# Patient Record
Sex: Female | Born: 1995 | Race: Black or African American | Hispanic: No | Marital: Single | Smoking: Never smoker
Health system: Southern US, Community
[De-identification: ages and names within clinical notes are randomized; demographics above are authoritative.]

## PROBLEM LIST (undated history)

## (undated) ENCOUNTER — Inpatient Hospital Stay (HOSPITAL_COMMUNITY): Payer: Self-pay

## (undated) DIAGNOSIS — O34219 Maternal care for unspecified type scar from previous cesarean delivery: Secondary | ICD-10-CM

## (undated) DIAGNOSIS — B009 Herpesviral infection, unspecified: Secondary | ICD-10-CM

## (undated) DIAGNOSIS — F419 Anxiety disorder, unspecified: Secondary | ICD-10-CM

## (undated) HISTORY — DX: Maternal care for unspecified type scar from previous cesarean delivery: O34.219

## (undated) NOTE — *Deleted (*Deleted)
   CC: ***  HPI:  [name] is a 75 y.o. female with history as below presenting for ***. Please refer to problem based charting for further details of assessment and plan of current problem and chronic medical conditions.  Hand pain Was last evaluated for this on 04/30/2020.  She has chronic bilateral pain of MCP and PIP joints with notable subjective swelling in the hands but no clear synovitis.  Also has intermittent abdominal/back pain and inflammation of terminal ileum prior to CT scan.  She does describe what sounds like erythema nodosum at times, but not present on prior examination.  Had colonoscopy with GI but no evidence of IBD at that time.  Previous lab work significant for negative ANA, CCP, RF, inflammatory markers, HIV, hep C, fecal calprotectin.  Did have an ESR of 25 at one point.  Chest x-ray was also ordered but patient never went to radiology department.  Last provider noticed skin thickening on her palms and raise question of scleroderma.  She requested a work note that she will be unable to use her hands, but was noted to be texting without difficulty during last appointment.  Was started on 5 days of steroids.  Healthcare maintenance -Tdap -Flu vaccine -Covid vaccine -Chlamydia screen  Past Medical History:  Diagnosis Date  . Anxiety 2016  . Herpes simplex virus (HSV) infection   . VBAC, delivered    Review of Systems:   ROS ***  Physical Exam: There were no vitals filed for this visit. Constitutional: no acute distress Head: atraumatic ENT: external ears normal Cardiovascular: regular rate and rhythm, normal heart sounds Pulmonary: effort normal, normal breath sounds bilaterally Abdominal: flat, nontender, no rebound tenderness, bowel sounds normal Musculoskeletal: *** Skin: warm and dry Neurological: alert, no focal deficit Psychiatric: normal mood and affect  Assessment & Plan:   See Encounters Tab for problem based charting.  Patient  {GC/GE:[id number]::"discussed with","seen with"} Dr. {NAMES:[id number]::"Butcher","Guilloud","Hoffman","Mullen","Narendra","Raines","Vincent"}

---
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
## 2016-09-30 ENCOUNTER — Encounter (HOSPITAL_COMMUNITY): Payer: Self-pay

## 2017-02-01 ENCOUNTER — Ambulatory Visit (HOSPITAL_COMMUNITY)
Admission: EM | Admit: 2017-02-01 | Discharge: 2017-02-01 | Disposition: A | Discharge status: Home / Self Care | Payer: Medicaid Other | Attending: Family Medicine | Admitting: Family Medicine

## 2017-02-01 ENCOUNTER — Encounter (HOSPITAL_COMMUNITY): Payer: Self-pay

## 2017-02-01 DIAGNOSIS — I808 Phlebitis and thrombophlebitis of other sites: Secondary | ICD-10-CM | POA: Diagnosis not present

## 2017-02-01 DIAGNOSIS — D649 Anemia, unspecified: Secondary | ICD-10-CM

## 2017-02-01 LAB — POCT I-STAT, CHEM 8
BUN: 11 mg/dL (ref 6–20)
CREATININE: 0.7 mg/dL (ref 0.44–1.00)
Calcium, Ion: 1.3 mmol/L (ref 1.15–1.40)
Chloride: 105 mmol/L (ref 101–111)
Glucose, Bld: 86 mg/dL (ref 65–99)
HEMATOCRIT: 31 % — AB (ref 36.0–46.0)
HEMOGLOBIN: 10.5 g/dL — AB (ref 12.0–15.0)
POTASSIUM: 3.8 mmol/L (ref 3.5–5.1)
SODIUM: 141 mmol/L (ref 135–145)
TCO2: 27 mmol/L (ref 0–100)

## 2017-02-01 MED ORDER — IBUPROFEN 600 MG PO TABS: 600.0000 mg | ORAL_TABLET | Freq: Three times a day (TID) | ORAL | 0 refills | Status: DC

## 2017-02-01 MED ORDER — FERROUS SULFATE 325 (65 FE) MG PO TABS: 325.0000 mg | ORAL_TABLET | Freq: Two times a day (BID) | ORAL | 3 refills | Status: DC

## 2017-02-01 NOTE — ED Provider Notes (Signed)
[facility]    CSN: [id number] Arrival date & time: 02/01/17  1925     History   Chief Complaint Chief Complaint  Patient presents with  . Arm Pain    HPI Phyllis Kidd is a 21 y.o. female.   HPI  21 year old female with history of mild anemia presents with complaint of 3 days of right upper arm pain with redness and swelling. She donated plasma 3 days ago he developed pain at the IV insertion site during the donation. She was unable to have her red cells return to her.  She reports symptoms have not improved over the last 3 days. She denies treatment. She denies fever or chills.  She does endorse lightheadedness, mild shortness of breath over the last year, intermittent sharp left flank and lower abdominal pains over the last week. She denies dysuria, nausea, vomiting, diarrhea and constipation.   Past Medical History:  Diagnosis Date  . Anxiety 2016    Patient Active Problem List   Diagnosis Date Noted  . Late prenatal care affecting pregnancy in third trimester, antepartum 11/26/2015    History reviewed. No pertinent surgical history.  OB History    Gravida Para Term Preterm AB Living   1 0 0 0 0     SAB TAB Ectopic Multiple Live Births   0 0 0           Home Medications    Prior to Admission medications   Medication Sig Start Date End Date Taking? Authorizing Provider  cephALEXin (KEFLEX) 500 MG capsule Take 1 capsule (500 mg total) by mouth 4 (four) times daily. 11/29/15   Marny Lowenstein, PA-C  Prenatal Vit-Fe Fumarate-FA (PRENATAL VITAMINS PLUS) 27-1 MG TABS Take 1 tablet by mouth once. 11/11/15   Willodean Rosenthal, MD  Prenatal Vit-Fe Fumarate-FA (PRENATAL VITAMINS PLUS) 27-1 MG TABS Take 1 tablet by mouth once. 11/11/15   Willodean Rosenthal, MD    Family History History reviewed. No pertinent family history.  Social History Social History  Substance Use Topics  . Smoking status: Never Smoker  . Smokeless tobacco: Never Used    . Alcohol use No     Allergies   Patient has no known allergies.   Review of Systems Review of Systems  Constitutional: Negative for chills and fever.  Eyes: Negative for visual disturbance.  Respiratory: Negative for shortness of breath.   Cardiovascular: Negative for chest pain.  Gastrointestinal: Positive for abdominal pain. Negative for blood in stool.  Musculoskeletal: Positive for myalgias. Negative for arthralgias and back pain.  Skin: Positive for color change. Negative for rash.  Allergic/Immunologic: Negative for immunocompromised state.  Neurological: Positive for light-headedness.  Hematological: Negative for adenopathy. Does not bruise/bleed easily.  Psychiatric/Behavioral: Negative for dysphoric mood and suicidal ideas.     Physical Exam Triage Vital Signs ED Triage Vitals  Enc Vitals Group     BP 02/01/17 1948 113/76     Pulse Rate 02/01/17 1948 95     Resp 02/01/17 1948 17     Temp 02/01/17 1948 98.8 F (37.1 C)     Temp Source 02/01/17 1948 Oral     SpO2 02/01/17 1948 100 %     Weight --      Height --      Head Circumference --      Peak Flow --      Pain Score 02/01/17 1950 9     Pain Loc --      Pain Edu? --  Excl. in GC? --    No data found.   Updated Vital Signs BP 113/76 (BP Location: Left Arm)   Pulse 95   Temp 98.8 F (37.1 C) (Oral)   Resp 17   LMP 01/25/2017 (Exact Date)   SpO2 100%   Visual Acuity Right Eye Distance:   Left Eye Distance:   Bilateral Distance:    Right Eye Near:   Left Eye Near:    Bilateral Near:     Physical Exam  Constitutional: She is oriented to person, place, and time. She appears well-developed and well-nourished. No distress.  HENT:  Head: Normocephalic and atraumatic.  Eyes:    Cardiovascular: Normal rate, regular rhythm, normal heart sounds and intact distal pulses.   Pulmonary/Chest: Effort normal and breath sounds normal.  Abdominal: Soft. Bowel sounds are normal. She exhibits no  distension and no mass. There is no tenderness. There is no rebound and no guarding. No hernia.  Musculoskeletal: She exhibits no edema.       Arms: Lymphadenopathy:    She has no axillary adenopathy.  Neurological: She is alert and oriented to person, place, and time.  Skin: Skin is warm and dry. No rash noted.  Psychiatric: She has a normal mood and affect.     UC Treatments / Results  Labs (all labs ordered are listed, but only abnormal results are displayed) Labs Reviewed  POCT I-STAT, CHEM 8 - Abnormal; Notable for the following:       Result Value   Hemoglobin 10.5 (*)    HCT 31.0 (*)    All other components within normal limits    EKG  EKG Interpretation None       Radiology No results found.  Procedures Procedures (including critical care time)  Medications Ordered in UC Medications - No data to display   Initial Impression / Assessment and Plan / UC Course  I have reviewed the triage vital signs and the nursing notes.  Pertinent labs & imaging results that were available during my care of the patient were reviewed by me and considered in my medical decision making (see chart for details).       Final Clinical Impressions(s) / UC Diagnoses   Final diagnoses:  Phlebitis of right arm  Mild anemia   Patient with evidence of phlebitis following plasma donation and mild anemia confirmed on i-STAT She's not orthostatic. There is no palpable thrombus on exam. Plan is for conservative management of phlebitis including NSAIDs. Iron order from mild anemia patient advised to establish with primary care provider to follow-up response to treatment. New Prescriptions Discharge Medication List as of 02/01/2017  8:43 PM    START taking these medications   Details  ferrous sulfate 325 (65 FE) MG tablet Take 1 tablet (325 mg total) by mouth 2 (two) times daily with a meal., Starting Wed 02/01/2017, Normal    ibuprofen (ADVIL,MOTRIN) 600 MG tablet Take 1 tablet  (600 mg total) by mouth 3 (three) times daily. With food for next 5-7 days, Starting Wed 02/01/2017, Normal         Dessa PhiFunches, Trestan Vahle, MD 02/01/17 2051

## 2017-02-01 NOTE — ED Triage Notes (Addendum)
[facility] of [facility] pain x3 days, no medication has been taken for pain, pt denies any injuries, pt states she was donating plasma and since then have been experiencing arm pain

## 2017-02-01 NOTE — ED Notes (Signed)
BP   101/69   Pulse  73        Laying         114  /77    Sitting      Pulse    70    bp  115/78  Pulse   71   Standing

## 2017-02-01 NOTE — Discharge Instructions (Signed)
You do have mild anemia which has actually improved from last year. This is low hemoglobin the part of blood that carries oxygen.  Please take iron twice daily with orange juice or vitamin C tablet to help improve anemia.  For your bruising, pain and swelling in your R arm this is called phlebitis from damage to the vein. Ice for 20 minutes twice daily with cold pack wrapped in a thin cloth.  Take ibuprofen with meals for next 5-7 days.

## 2017-02-27 ENCOUNTER — Ambulatory Visit (HOSPITAL_COMMUNITY)
Admission: EM | Admit: 2017-02-27 | Discharge: 2017-02-27 | Discharge status: Against Medical Advice | Payer: Medicaid Other

## 2017-02-27 NOTE — ED Notes (Addendum)
Called x2... No answer... D/c from dept.

## 2017-02-27 NOTE — ED Notes (Signed)
Called name x1... No answer.

## 2017-04-14 ENCOUNTER — Inpatient Hospital Stay (HOSPITAL_COMMUNITY)
Admission: AD | Admit: 2017-04-14 | Discharge: 2017-04-14 | Disposition: A | Discharge status: Home / Self Care | Payer: Medicaid Other | Source: Ambulatory Visit | Attending: Obstetrics and Gynecology | Admitting: Obstetrics and Gynecology

## 2017-04-14 ENCOUNTER — Telehealth: Payer: Self-pay | Admitting: Student

## 2017-04-14 ENCOUNTER — Encounter (HOSPITAL_COMMUNITY): Payer: Self-pay | Admitting: *Deleted

## 2017-04-14 ENCOUNTER — Inpatient Hospital Stay (HOSPITAL_COMMUNITY): Payer: Medicaid Other

## 2017-04-14 DIAGNOSIS — Z3A01 Less than 8 weeks gestation of pregnancy: Secondary | ICD-10-CM | POA: Diagnosis not present

## 2017-04-14 DIAGNOSIS — O23591 Infection of other part of genital tract in pregnancy, first trimester: Secondary | ICD-10-CM | POA: Insufficient documentation

## 2017-04-14 DIAGNOSIS — R109 Unspecified abdominal pain: Secondary | ICD-10-CM | POA: Insufficient documentation

## 2017-04-14 DIAGNOSIS — O26891 Other specified pregnancy related conditions, first trimester: Secondary | ICD-10-CM

## 2017-04-14 DIAGNOSIS — N760 Acute vaginitis: Secondary | ICD-10-CM

## 2017-04-14 DIAGNOSIS — B9689 Other specified bacterial agents as the cause of diseases classified elsewhere: Secondary | ICD-10-CM

## 2017-04-14 DIAGNOSIS — O26899 Other specified pregnancy related conditions, unspecified trimester: Secondary | ICD-10-CM

## 2017-04-14 DIAGNOSIS — R102 Pelvic and perineal pain: Secondary | ICD-10-CM

## 2017-04-14 LAB — COMPREHENSIVE METABOLIC PANEL
ALT: 9 U/L — AB (ref 14–54)
AST: 14 U/L — AB (ref 15–41)
Albumin: 4.1 g/dL (ref 3.5–5.0)
Alkaline Phosphatase: 45 U/L (ref 38–126)
Anion gap: 5 (ref 5–15)
BILIRUBIN TOTAL: 0.3 mg/dL (ref 0.3–1.2)
BUN: 8 mg/dL (ref 6–20)
CHLORIDE: 105 mmol/L (ref 101–111)
CO2: 24 mmol/L (ref 22–32)
CREATININE: 0.56 mg/dL (ref 0.44–1.00)
Calcium: 9.3 mg/dL (ref 8.9–10.3)
Glucose, Bld: 98 mg/dL (ref 65–99)
Potassium: 3.8 mmol/L (ref 3.5–5.1)
Sodium: 134 mmol/L — ABNORMAL LOW (ref 135–145)
TOTAL PROTEIN: 7.7 g/dL (ref 6.5–8.1)

## 2017-04-14 LAB — URINALYSIS, ROUTINE W REFLEX MICROSCOPIC
BILIRUBIN URINE: NEGATIVE
GLUCOSE, UA: NEGATIVE mg/dL
HGB URINE DIPSTICK: NEGATIVE
KETONES UR: NEGATIVE mg/dL
Leukocytes, UA: NEGATIVE
Nitrite: NEGATIVE
PH: 5 (ref 5.0–8.0)
PROTEIN: NEGATIVE mg/dL
Specific Gravity, Urine: 1.025 (ref 1.005–1.030)

## 2017-04-14 LAB — CBC WITH DIFFERENTIAL/PLATELET
Basophils Absolute: 0 10*3/uL (ref 0.0–0.1)
Basophils Relative: 0 %
EOS ABS: 0 10*3/uL (ref 0.0–0.7)
EOS PCT: 0 %
HCT: 32.6 % — ABNORMAL LOW (ref 36.0–46.0)
Hemoglobin: 10.9 g/dL — ABNORMAL LOW (ref 12.0–15.0)
LYMPHS ABS: 2.6 10*3/uL (ref 0.7–4.0)
LYMPHS PCT: 33 %
MCH: 27.4 pg (ref 26.0–34.0)
MCHC: 33.4 g/dL (ref 30.0–36.0)
MCV: 81.9 fL (ref 78.0–100.0)
MONOS PCT: 2 %
Monocytes Absolute: 0.2 10*3/uL (ref 0.1–1.0)
Neutro Abs: 5 10*3/uL (ref 1.7–7.7)
Neutrophils Relative %: 65 %
PLATELETS: 331 10*3/uL (ref 150–400)
RBC: 3.98 MIL/uL (ref 3.87–5.11)
RDW: 14.3 % (ref 11.5–15.5)
WBC: 7.8 10*3/uL (ref 4.0–10.5)

## 2017-04-14 LAB — WET PREP, GENITAL
SPERM: NONE SEEN
TRICH WET PREP: NONE SEEN
YEAST WET PREP: NONE SEEN

## 2017-04-14 LAB — ABO/RH: ABO/RH(D): B POS

## 2017-04-14 LAB — HCG, QUANTITATIVE, PREGNANCY: hCG, Beta Chain, Quant, S: 171430 m[IU]/mL — ABNORMAL HIGH (ref <5)

## 2017-04-14 LAB — POCT PREGNANCY, URINE: Preg Test, Ur: POSITIVE — AB

## 2017-04-14 MED ORDER — METRONIDAZOLE 500 MG PO TABS: 500.0000 mg | ORAL_TABLET | Freq: Three times a day (TID) | ORAL | 0 refills | Status: AC

## 2017-04-14 NOTE — Telephone Encounter (Signed)
Called patient and informed her of her BV diagnosis; instructed patient on antabuse reaction and importance of taking medication with food.

## 2017-04-14 NOTE — MAU Provider Note (Signed)
Chief Complaint:  Abdominal Pain   First Provider Initiated Contact with Patient 04/14/17 1450     HPI: Phyllis Kidd is a 21 y.o. G2P1001 at [redacted]w[redacted]d who presents to maternity admissions reporting abdominal pain. Pt reports that she's been having abdominal pain and headaches for about three months. She notes that the pain is a sharp and constant pain that is really only relieved by laying still on her back. The she is able to ambulate but pain restricts her movement as to not making quick movements. She tried taking ibuprofen for the pain and headaches but they did not help. She notes that the pain has been progressively getting worse and she was encouraged by her family to come to the hospital for evaluation. Pt is unable to pinpoint LMP but say that is was at the beginning of august. When pressed she says her LMP began august 5th ot 6th. Pt reports that her diet often consists of fast food and pizza, which she eats for most meals, the last two days she ate fast foods like [employer] and cookout, hotdogs, pizza for each meal. She reports nausea and she reports SOB occasionally in the mornings when she wakes up. She denies, vomiting, diarrhea, constipation, fever, visual changes, dysuria, pyuria, vaginal discharge, and vaginal bleeding.   Past Medical History: Past Medical History:  Diagnosis Date  . Anxiety 2016    Past obstetric history: OB History  Gravida Para Term Preterm AB Living  0 0 1  SAB TAB Ectopic Multiple Live Births  0 0 0   1    # Outcome Date GA Lbr Len/2nd Weight Sex Delivery Anes PTL Lv  2 Current           1 Term 01/18/16 [redacted]w[redacted]d   M CS-LTranv EPI  LIV      Past Surgical History: No past surgical history on file.   Family History: No family history on file.  Social History: Social History  Substance Use Topics  . Smoking status: Never Smoker  . Smokeless tobacco: Never Used  . Alcohol use No    Allergies: No Known Allergies  Meds:  Prescriptions  Prior to Admission  Medication Sig Dispense Refill Last Dose  . ferrous sulfate 325 (65 FE) MG tablet Take 1 tablet (325 mg total) by mouth 2 (two) times daily with a meal. 60 tablet 3   . ibuprofen (ADVIL,MOTRIN) 600 MG tablet Take 1 tablet (600 mg total) by mouth 3 (three) times daily. With food for next 5-7 days 30 tablet 0     I have reviewed patient's Past Medical Hx, Surgical Hx, Family Hx, Social Hx, medications and allergies.   ROS:  Review of Systems  Constitutional: Negative.   HENT: Negative.   Respiratory: Positive for shortness of breath.   Cardiovascular: Negative.   Gastrointestinal: Positive for abdominal pain and nausea. Negative for blood in stool, constipation, diarrhea and vomiting.  Genitourinary: Positive for pelvic pain. Negative for dysuria, hematuria, vaginal bleeding and vaginal discharge.  Musculoskeletal: Negative.   Neurological: Negative.     Physical Exam  Patient Vitals for the past 24 hrs:  BP Temp Pulse Resp Height Weight  04/14/17 1444 127/71 98.4 F (36.9 C) 96 18  (1.676 m) 64.4 kg (142 lb)   Constitutional: Well-developed, well-nourished female in no acute distress.  Cardiovascular: normal rate Respiratory: normal effort GI: Abd soft, tender in all 4 abdominal quadrants but particular tender in the RLQ and LLQ MS: Extremities nontender, no  edema, normal ROM Neurologic: Alert and oriented x 4.  GU: Neg CVAT. Pelvic: NEFG, small suprapubic mass felt on palpation, thin white watery discharge coming from vagina, no blood, no cervical motion tenderness, no adnexal mass     Labs: Results for orders placed or performed during the hospital encounter of 04/14/17 (from the past 24 hour(s))  Urinalysis, Routine w reflex microscopic     Status: None   Collection Time: 04/14/17  2:35 PM  Result Value Ref Range   Color, Urine YELLOW YELLOW   APPearance CLEAR CLEAR   Specific Gravity, Urine 1.025 1.005 - 1.030   pH 5.0 5.0 - 8.0   Glucose, UA  NEGATIVE NEGATIVE mg/dL   Hgb urine dipstick NEGATIVE NEGATIVE   Bilirubin Urine NEGATIVE NEGATIVE   Ketones, ur NEGATIVE NEGATIVE mg/dL   Protein, ur NEGATIVE NEGATIVE mg/dL   Nitrite NEGATIVE NEGATIVE   Leukocytes, UA NEGATIVE NEGATIVE  Pregnancy, urine POC     Status: Abnormal   Collection Time: 04/14/17  3:14 PM  Result Value Ref Range   Preg Test, Ur POSITIVE (A) NEGATIVE  CBC with Differential/Platelet     Status: Abnormal   Collection Time: 04/14/17  4:05 PM  Result Value Ref Range   WBC 7.8 4.0 - 10.5 K/uL   RBC 3.98 3.87 - 5.11 MIL/uL   Hemoglobin 10.9 (L) 12.0 - 15.0 g/dL   HCT 16.132.6 (L) 09.636.0 - 04.546.0 %   MCV 81.9 78.0 - 100.0 fL   MCH 27.4 26.0 - 34.0 pg   MCHC 33.4 30.0 - 36.0 g/dL   RDW 40.914.3 81.111.5 - 91.415.5 %   Platelets 331 150 - 400 K/uL   Neutrophils Relative % 65 %   Neutro Abs 5.0 1.7 - 7.7 K/uL   Lymphocytes Relative 33 %   Lymphs Abs 2.6 0.7 - 4.0 K/uL   Monocytes Relative 2 %   Monocytes Absolute 0.2 0.1 - 1.0 K/uL   Eosinophils Relative 0 %   Eosinophils Absolute 0.0 0.0 - 0.7 K/uL   Basophils Relative 0 %   Basophils Absolute 0.0 0.0 - 0.1 K/uL  ABO/Rh     Status: None   Collection Time: 04/14/17  4:05 PM  Result Value Ref Range   ABO/RH(D) B POS   Comprehensive metabolic panel     Status: Abnormal   Collection Time: 04/14/17  4:05 PM  Result Value Ref Range   Sodium 134 (L) 135 - 145 mmol/L   Potassium 3.8 3.5 - 5.1 mmol/L   Chloride 105 101 - 111 mmol/L   CO2 24 22 - 32 mmol/L   Glucose, Bld 98 65 - 99 mg/dL   BUN 8 6 - 20 mg/dL   Creatinine, Ser 7.820.56 0.44 - 1.00 mg/dL   Calcium 9.3 8.9 - 95.610.3 mg/dL   Total Protein 7.7 6.5 - 8.1 g/dL   Albumin 4.1 3.5 - 5.0 g/dL   AST 14 (L) 15 - 41 U/L   ALT 9 (L) 14 - 54 U/L   Alkaline Phosphatase 45 38 - 126 U/L   Total Bilirubin 0.3 0.3 - 1.2 mg/dL   GFR calc non Af Amer >60 >60 mL/min   GFR calc Af Amer >60 >60 mL/min   Anion gap 5 5 - 15    Imaging:  [place] Ob Comp Less 14 Wks  Result Date:  04/14/2017 CLINICAL DATA:  Pelvic pain affecting first-trimester pregnancy. Gestational age by LMP of 4 weeks 3 days. EXAM: OBSTETRIC <14 WK US AND TRANSVAGINAL OB [place]  TECHNIQUE: Both transabdominal and transvaginal ultrasound examinations were performed for complete evaluation of the gestation as well as the maternal uterus, adnexal regions, and pelvic cul-de-sac. Transvaginal technique was performed to assess early pregnancy. COMPARISON:  None. FINDINGS: Intrauterine gestational sac: Single Yolk sac:  Visualized. Embryo:  Visualized. Cardiac Activity: Visualized. Heart Rate: 166  bpm CRL:  16  mm   8 w   0 d                  [place] EDC: 11/24/2017 Subchorionic hemorrhage:  None visualized. Maternal uterus/adnexae: Small left ovarian corpus luteum. Normal appearance of right ovary. No mass or abnormal free fluid identified. IMPRESSION: Single living IUP measuring 8 weeks 0 days, with [place] EDC of 11/24/2017. No significant maternal uterine or adnexal abnormality identified. Electronically Signed   By: Myles Rosenthal M.D.   On: 04/14/2017 16:53   US Ob Transvaginal  Result Date: 04/14/2017 CLINICAL DATA:  Pelvic pain affecting first-trimester pregnancy. Gestational age by LMP of 4 weeks 3 days. EXAM: OBSTETRIC <14 WK [place] AND TRANSVAGINAL OB US TECHNIQUE: Both transabdominal and transvaginal ultrasound examinations were performed for complete evaluation of the gestation as well as the maternal uterus, adnexal regions, and pelvic cul-de-sac. Transvaginal technique was performed to assess early pregnancy. COMPARISON:  None. FINDINGS: Intrauterine gestational sac: Single Yolk sac:  Visualized. Embryo:  Visualized. Cardiac Activity: Visualized. Heart Rate: 166  bpm CRL:  16  mm   8 w   0 d                  [place] EDC: 11/24/2017 Subchorionic hemorrhage:  None visualized. Maternal uterus/adnexae: Small left ovarian corpus luteum. Normal appearance of right ovary. No mass or abnormal free fluid identified. IMPRESSION: Single living IUP  measuring 8 weeks 0 days, with [place] EDC of 11/24/2017. No significant maternal uterine or adnexal abnormality identified. Electronically Signed   By: Myles Rosenthal M.D.   On: 04/14/2017 16:53    MAU Course: -Pregnancy test - positive -Doppler - HR unable to be doppled -U/S - see above -CBC -ABO/Rh -Wet prep-positive for clue -GC/Chlamydia-pending  Pt has an 8wk IUP. bHCG results returned positive for pregnancy, after a work up for ectopic location involving an abdominal and a vaginal U/S, ectopic pregnancy can be ruled out. Will need to schedule an appointment at [facility] to initiate prenatal care. Abdominal pain could be a result of pt's dietary habits. Patient was advised on diet and that a change in what she eats could help the abdominal. If pain is refractory to dietary management, will need to follow up with GI.  MDM:  Assessment/Plan:  1. Abdominal pain affecting pregnancy, antepartum   2. Pelvic pain affecting pregnancy   3. Bacterial vaginosis       Discharge home in stable condition.     Julieanne Cotton T, Medical Student 04/14/2017 3:50 PM  I confirm that I have verified the information documented in the student's note and that I have also personally reperformed the physical exam and all medical decision making activities.    2. Patient left before she received results of her wet prep; she knows that we will call her if she needs medication.  3. Recommended that patient try diet and lifestyle changes first for abdominal pain, and that she seek out a PCP if these measures fail.  4. Patient does not want numbers for ob-gyn clinics at this time; she is considering her options.  5. Return precautions given; all questions answered.  Luna Kitchens CNM

## 2017-04-14 NOTE — Discharge Instructions (Signed)
First Trimester of Pregnancy The first trimester of pregnancy is from week 1 until the end of week 13 (months 1 through 3). During this time, your baby will begin to develop inside you. At 6-8 weeks, the eyes and face are formed, and the heartbeat can be seen on ultrasound. At the end of 12 weeks, all the baby's organs are formed. Prenatal care is all the medical care you receive before the birth of your baby. Make sure you get good prenatal care and follow all of your doctor's instructions. Follow these instructions at home: Medicines  Take over-the-counter and prescription medicines only as told by your doctor. Some medicines are safe and some medicines are not safe during pregnancy.  Take a prenatal vitamin that contains at least 600 micrograms (mcg) of folic acid.  If you have trouble pooping (constipation), take medicine that will make your stool soft (stool softener) if your doctor approves. Eating and drinking  Eat regular, healthy meals.  Your doctor will tell you the amount of weight gain that is right for you.  Avoid raw meat and uncooked cheese.  If you feel sick to your stomach (nauseous) or throw up (vomit): ? Eat 4 or 5 small meals a day instead of 3 large meals. ? Try eating a few soda crackers. ? Drink liquids between meals instead of during meals.  To prevent constipation: ? Eat foods that are high in fiber, like fresh fruits and vegetables, whole grains, and beans. ? Drink enough fluids to keep your pee (urine) clear or pale yellow. Activity  Exercise only as told by your doctor. Stop exercising if you have cramps or pain in your lower belly (abdomen) or low back.  Do not exercise if it is too hot, too humid, or if you are in a place of great height (high altitude).  Try to avoid standing for long periods of time. Move your legs often if you must stand in one place for a long time.  Avoid heavy lifting.  Wear low-heeled shoes. Sit and stand up straight.  You  can have sex unless your doctor tells you not to. Relieving pain and discomfort  Wear a good support bra if your breasts are sore.  Take warm water baths (sitz baths) to soothe pain or discomfort caused by hemorrhoids. Use hemorrhoid cream if your doctor says it is okay.  Rest with your legs raised if you have leg cramps or low back pain.  If you have puffy, bulging veins (varicose veins) in your legs: ? Wear support hose or compression stockings as told by your doctor. ? Raise (elevate) your feet for 15 minutes, 3-4 times a day. ? Limit salt in your food. Prenatal care  Schedule your prenatal visits by the twelfth week of pregnancy.  Write down your questions. Take them to your prenatal visits.  Keep all your prenatal visits as told by your doctor. This is important. Safety  Wear your seat belt at all times when driving.  Make a list of emergency phone numbers. The list should include numbers for family, friends, the hospital, and police and fire departments. General instructions  Ask your doctor for a referral to a local prenatal class. Begin classes no later than at the start of month 6 of your pregnancy.  Ask for help if you need counseling or if you need help with nutrition. Your doctor can give you advice or tell you where to go for help.  Do not use hot tubs, steam rooms, or   saunas.  Do not douche or use tampons or scented sanitary pads.  Do not cross your legs for long periods of time.  Avoid all herbs and alcohol. Avoid drugs that are not approved by your doctor.  Do not use any tobacco products, including cigarettes, chewing tobacco, and electronic cigarettes. If you need help quitting, ask your doctor. You may get counseling or other support to help you quit.  Avoid cat litter boxes and soil used by cats. These carry germs that can cause birth defects in the baby and can cause a loss of your baby (miscarriage) or stillbirth.  Visit your dentist. At home, brush  your teeth with a soft toothbrush. Be gentle when you floss. Contact a doctor if:  You are dizzy.  You have mild cramps or pressure in your lower belly.  You have a nagging pain in your belly area.  You continue to feel sick to your stomach, you throw up, or you have watery poop (diarrhea).  You have a bad smelling fluid coming from your vagina.  You have pain when you pee (urinate).  You have increased puffiness (swelling) in your face, hands, legs, or ankles. Get help right away if:  You have a fever.  You are leaking fluid from your vagina.  You have spotting or bleeding from your vagina.  You have very bad belly cramping or pain.  You gain or lose weight rapidly.  You throw up blood. It may look like coffee grounds.  You are around people who have German measles, fifth disease, or chickenpox.  You have a very bad headache.  You have shortness of breath.  You have any kind of trauma, such as from a fall or a car accident. Summary  The first trimester of pregnancy is from week 1 until the end of week 13 (months 1 through 3).  To take care of yourself and your unborn baby, you will need to eat healthy meals, take medicines only if your doctor tells you to do so, and do activities that are safe for you and your baby.  Keep all follow-up visits as told by your doctor. This is important as your doctor will have to ensure that your baby is healthy and growing well. This information is not intended to replace advice given to you by your health care provider. Make sure you discuss any questions you have with your health care provider. Document Released: 01/11/2008 Document Revised: 08/02/2016 Document Reviewed: 08/02/2016 Elsevier Interactive Patient Education  2017 [employer]  

## 2017-04-14 NOTE — MAU Note (Signed)
Pt presents to MAU with complaints of generalized abdominal pain for 3 months. Reports headaches in the morning at times when she wakes up. Denies any VB or abnormal discharge

## 2017-04-17 LAB — GC/CHLAMYDIA PROBE AMP (~~LOC~~) NOT AT ARMC
CHLAMYDIA, DNA PROBE: NEGATIVE
Neisseria Gonorrhea: NEGATIVE

## 2017-05-01 NOTE — MAU Note (Signed)
Pt requesting OB Provider list. Gave new pregnancy verification letter and OB  Provider list to patient.

## 2017-05-17 ENCOUNTER — Encounter: Payer: Medicaid Other | Admitting: Certified Nurse Midwife

## 2017-05-31 ENCOUNTER — Ambulatory Visit (INDEPENDENT_AMBULATORY_CARE_PROVIDER_SITE_OTHER): Payer: Medicaid Other | Admitting: Obstetrics & Gynecology

## 2017-05-31 ENCOUNTER — Encounter: Payer: Medicaid Other | Admitting: Certified Nurse Midwife

## 2017-05-31 VITALS — BP 122/81 | HR 88 | Wt 152.0 lb

## 2017-05-31 DIAGNOSIS — Z23 Encounter for immunization: Secondary | ICD-10-CM | POA: Diagnosis not present

## 2017-05-31 DIAGNOSIS — Z3482 Encounter for supervision of other normal pregnancy, second trimester: Secondary | ICD-10-CM | POA: Diagnosis not present

## 2017-05-31 DIAGNOSIS — Z3480 Encounter for supervision of other normal pregnancy, unspecified trimester: Secondary | ICD-10-CM | POA: Insufficient documentation

## 2017-05-31 DIAGNOSIS — Z3689 Encounter for other specified antenatal screening: Secondary | ICD-10-CM

## 2017-05-31 NOTE — Patient Instructions (Signed)

## 2017-05-31 NOTE — Progress Notes (Signed)
Subjective:   Phyllis Kidd is a 21 y.o. G2P1001 at 4658w5d by early 8 week ultrasound being seen today for her first obstetrical visit.  Her obstetrical history is significant for one term SVD. Patient does intend to breast feed. Pregnancy history fully reviewed.  Patient reports no complaints.  HISTORY: Obstetric History   G2   P1   T1   P0   A0   L1    SAB0   TAB0   Ectopic0   Multiple0   Live Births1     # Outcome Date GA Lbr Len/2nd Weight Sex Delivery Anes PTL Lv  2 Current           1 Term 01/18/16 3939w5d   M CS-LTranv EPI  LIV     Past Medical History:  Diagnosis Date  . Anxiety 2016   No past surgical history on file. No family history on file. Social History  Substance Use Topics  . Smoking status: Never Smoker  . Smokeless tobacco: Never Used  . Alcohol use No   No Known Allergies Current Outpatient Prescriptions on File Prior to Visit  Medication Sig Dispense Refill  . ferrous sulfate 325 (65 FE) MG tablet Take 1 tablet (325 mg total) by mouth 2 (two) times daily with a meal. 60 tablet 3   No current facility-administered medications on file prior to visit.      Exam   Vitals:   05/31/17 1514  BP: 122/81  Pulse: 88  Weight: 152 lb (68.9 kg)   Fetal Heart Rate (bpm): 156  Uterus:     Pelvic Exam: Deferred   System: General: well-developed, well-nourished female in no acute distress   Breast:  normal appearance, no masses or tenderness   Skin: normal coloration and turgor, no rashes   Neurologic: oriented, normal, negative, normal mood   Extremities: normal strength, tone, and muscle mass, ROM of all joints is normal   HEENT PERRLA, extraocular movement intact and sclera clear, anicteric   Mouth/Teeth mucous membranes moist, pharynx normal without lesions and dental hygiene good   Neck supple and no masses   Cardiovascular: regular rate and rhythm   Respiratory:  no respiratory distress, normal breath sounds   Abdomen: soft, non-tender;  bowel sounds normal; no masses,  no organomegaly     Assessment:   Pregnancy: G2P1001 Patient Active Problem List   Diagnosis Date Noted  . Supervision of other normal pregnancy, antepartum 05/31/2017     Plan:  1. Need for immunization against influenza - Flu Vaccine QUAD 36+ mos IM given  2. Encounter for fetal anatomic survey Anatomy scan ordered - US MFM OB COMP + 14 WK; Future  3. Supervision of other normal pregnancy, antepartum Initial labs drawn. - Hemoglobinopathy evaluation - Culture, OB Urine - Varicella Zoster Abs, IgG/IgM - Obstetric Panel, Including HIV - AFP TETRA - SMN1 COPY NUMBER ANALYSIS (SMA Carrier Screen) - Cystic Fibrosis Mutation 97 - US MFM OB COMP + 14 WK; Future - Urine cytology ancillary only Continue prenatal vitamins. Genetic Screening discussed, Quad screen: ordered. Ultrasound discussed; fetal anatomic survey: ordered. Problem list reviewed and updated. The [facility] with multiple MDs and other Advanced Practice Providers was explained to patient; also emphasized that residents, students are part of our team. Routine obstetric precautions reviewed. Return in about 4 weeks (around 06/28/2017) for OB Visit.     Jaynie CollinsUGONNA  Santez Woodcox, MD, FACOG Attending Obstetrician & Gynecologist, [facility]  [facility]

## 2017-06-02 LAB — OBSTETRIC PANEL, INCLUDING HIV
ANTIBODY SCREEN: NEGATIVE
BASOS: 0 %
Basophils Absolute: 0 10*3/uL (ref 0.0–0.2)
EOS (ABSOLUTE): 0.1 10*3/uL (ref 0.0–0.4)
EOS: 1 %
HEMATOCRIT: 33.7 % — AB (ref 34.0–46.6)
HEMOGLOBIN: 11.2 g/dL (ref 11.1–15.9)
HEP B S AG: NEGATIVE
HIV SCREEN 4TH GENERATION: NONREACTIVE
IMMATURE GRANS (ABS): 0 10*3/uL (ref 0.0–0.1)
Immature Granulocytes: 0 %
LYMPHS: 29 %
Lymphocytes Absolute: 2.1 10*3/uL (ref 0.7–3.1)
MCH: 27.6 pg (ref 26.6–33.0)
MCHC: 33.2 g/dL (ref 31.5–35.7)
MCV: 83 fL (ref 79–97)
MONOCYTES: 5 %
Monocytes Absolute: 0.4 10*3/uL (ref 0.1–0.9)
NEUTROS ABS: 4.8 10*3/uL (ref 1.4–7.0)
Neutrophils: 65 %
Platelets: 340 10*3/uL (ref 150–379)
RBC: 4.06 x10E6/uL (ref 3.77–5.28)
RDW: 16.7 % — AB (ref 12.3–15.4)
RH TYPE: POSITIVE
RPR: NONREACTIVE
RUBELLA: 1.48 {index} (ref >0.99)
WBC: 7.4 10*3/uL (ref 3.4–10.8)

## 2017-06-02 LAB — HEMOGLOBINOPATHY EVALUATION
HEMOGLOBIN F QUANTITATION: 0 % (ref 0.0–2.0)
HGB A: 97.7 % (ref 96.4–98.8)
HGB C: 0 %
HGB S: 0 %
HGB VARIANT: 0 %
Hemoglobin A2 Quantitation: 2.3 % (ref 1.8–3.2)

## 2017-06-02 LAB — URINE CULTURE, OB REFLEX

## 2017-06-02 LAB — VARICELLA ZOSTER ABS, IGG/IGM

## 2017-06-02 LAB — CULTURE, OB URINE

## 2017-06-05 ENCOUNTER — Encounter: Payer: Self-pay | Admitting: Obstetrics & Gynecology

## 2017-06-05 DIAGNOSIS — O09899 Supervision of other high risk pregnancies, unspecified trimester: Secondary | ICD-10-CM | POA: Insufficient documentation

## 2017-06-05 DIAGNOSIS — Z283 Underimmunization status: Secondary | ICD-10-CM

## 2017-06-07 LAB — CYSTIC FIBROSIS MUTATION 97: GENE DIS ANAL CARRIER INTERP BLD/T-IMP: NOT DETECTED

## 2017-06-08 LAB — SMN1 COPY NUMBER ANALYSIS (SMA CARRIER SCREENING)

## 2017-06-12 LAB — AFP TETRA
DIA MOM VALUE: 1.65
DIA VALUE (EIA): 301.66 pg/mL
DSR (BY AGE) 1 IN: 1156
DSR (SECOND TRIMESTER) 1 IN: 1176
Gestational Age: 15 WEEKS
MATERNAL AGE AT EDD: 20.5 a
MSAFP Mom: 0.84
MSAFP: 25.3 ng/mL
MSHCG MOM: 1.09
MSHCG: 56674 m[IU]/mL
Osb Risk: 10000
TEST RESULTS AFP: NEGATIVE
WEIGHT: 152 [lb_av]
uE3 Mom: 0.84
uE3 Value: 0.46 ng/mL

## 2017-06-21 ENCOUNTER — Encounter (HOSPITAL_COMMUNITY): Payer: Self-pay

## 2017-06-21 ENCOUNTER — Other Ambulatory Visit: Payer: Self-pay

## 2017-06-21 ENCOUNTER — Inpatient Hospital Stay (HOSPITAL_COMMUNITY)
Admission: AD | Admit: 2017-06-21 | Discharge: 2017-06-21 | Disposition: A | Discharge status: Home / Self Care | Payer: Medicaid Other | Source: Ambulatory Visit | Attending: Obstetrics and Gynecology | Admitting: Obstetrics and Gynecology

## 2017-06-21 ENCOUNTER — Encounter (HOSPITAL_COMMUNITY): Payer: Self-pay | Admitting: Obstetrics & Gynecology

## 2017-06-21 DIAGNOSIS — R109 Unspecified abdominal pain: Secondary | ICD-10-CM

## 2017-06-21 DIAGNOSIS — R102 Pelvic and perineal pain: Secondary | ICD-10-CM | POA: Diagnosis present

## 2017-06-21 DIAGNOSIS — B9689 Other specified bacterial agents as the cause of diseases classified elsewhere: Secondary | ICD-10-CM | POA: Diagnosis not present

## 2017-06-21 DIAGNOSIS — N760 Acute vaginitis: Secondary | ICD-10-CM | POA: Diagnosis not present

## 2017-06-21 DIAGNOSIS — Z3A17 17 weeks gestation of pregnancy: Secondary | ICD-10-CM | POA: Insufficient documentation

## 2017-06-21 DIAGNOSIS — O23592 Infection of other part of genital tract in pregnancy, second trimester: Secondary | ICD-10-CM | POA: Diagnosis not present

## 2017-06-21 LAB — WET PREP, GENITAL
SPERM: NONE SEEN
TRICH WET PREP: NONE SEEN
YEAST WET PREP: NONE SEEN

## 2017-06-21 MED ORDER — PRENATAL MULTIVITAMIN CH: 1.0000 | ORAL_TABLET | Freq: Every day | ORAL | 12 refills | Status: DC

## 2017-06-21 MED ORDER — ACETAMINOPHEN 500 MG PO TABS
1000.0000 mg | ORAL_TABLET | Freq: Once | ORAL | Status: AC
  Administered 2017-06-21: 1000 mg via ORAL
  Filled 2017-06-21: qty 2

## 2017-06-21 MED ORDER — METRONIDAZOLE 500 MG PO TABS: 500.0000 mg | ORAL_TABLET | Freq: Two times a day (BID) | ORAL | 0 refills | Status: AC

## 2017-06-21 MED ORDER — COMFORT FIT MATERNITY SUPP SM MISC: 1.0000 [IU] | Freq: Every day | 0 refills | Status: DC | PRN

## 2017-06-21 NOTE — MAU Note (Signed)
Pt. States that she started having pelvic and abdominal pain before she found out she was pregnant. She states that she was seen here in MAU for the abdominal/pelvic pain and that's when she first found out she was pregnant. The pt states that the provider told her this pain was due to the baby growing. She believes that something more is going on and would like to be re-evaluated.

## 2017-06-21 NOTE — MAU Note (Signed)
Urine in lab. Culture in lab

## 2017-06-21 NOTE — MAU Provider Note (Signed)
  History     CSN: [id number]  Arrival date and time: 06/21/17 1145   First Provider Initiated Contact with Patient 06/21/17 1251      Chief Complaint  Patient presents with  . Pelvic Pain   HPI  Ms. Phyllis Kidd is a 21 y.o. G2P1001 at 6856w5d gestation by U/S presenting to MAU with complaints of pelvic and abdominal pain since before she found out she was pregnant.  Past Medical History:  Diagnosis Date  . Anxiety 2016    Past Surgical History:  Procedure Laterality Date  . CESAREAN SECTION  01/18/2016    History reviewed. No pertinent family history.  Social History   Tobacco Use  . Smoking status: Never Smoker  . Smokeless tobacco: Never Used  Substance Use Topics  . Alcohol use: No  . Drug use: No    Allergies: No Known Allergies  Medications Prior to Admission  Medication Sig Dispense Refill Last Dose  . ferrous sulfate 325 (65 FE) MG tablet Take 1 tablet (325 mg total) by mouth 2 (two) times daily with a meal. 60 tablet 3 Taking    Review of Systems  Constitutional: Negative.   HENT: Negative.   Eyes: Negative.   Respiratory: Negative.   Cardiovascular: Negative.   Gastrointestinal: Positive for abdominal pain.  Endocrine: Negative.   Genitourinary: Positive for pelvic pain.  Musculoskeletal: Negative.   Skin: Negative.   Allergic/Immunologic: Negative.   Neurological: Negative.   Hematological: Negative.   Psychiatric/Behavioral: Negative.    Physical Exam   Blood pressure (!) 103/53, pulse 82, temperature 97.9 F (36.6 C), temperature source Oral, resp. rate 16, height 5\' 3"  (1.6 m), weight 155 lb 12 oz (70.6 kg), last menstrual period 03/14/2017, SpO2 100 %.  Physical Exam  MAU Course  Procedures  MDM Wet Prep Tylenol 1000 mg -- improved FHTs by doppler: 149 bpm  Results for orders placed or performed during the hospital encounter of 06/21/17 (from the past 24 hour(s))  Wet prep, genital     Status: Abnormal   Collection Time:  06/21/17  1:04 PM  Result Value Ref Range   Yeast Wet Prep HPF POC NONE SEEN NONE SEEN   Trich, Wet Prep NONE SEEN NONE SEEN   Clue Cells Wet Prep HPF POC PRESENT (A) NONE SEEN   WBC, Wet Prep HPF POC FEW (A) NONE SEEN   Sperm NONE SEEN      Assessment and Plan  Bacterial vaginitis - Rx for Flagyl 500 mg po BID x 7 days - Information provided on BV and Flagyl  Discharge home Patient verbalized an understanding of the plan of care and agrees.    Raelyn Moraolitta Tifany Hirsch, MSN, CNM 06/21/2017, 12:54 PM

## 2017-06-22 LAB — HIV ANTIBODY (ROUTINE TESTING W REFLEX): HIV Screen 4th Generation wRfx: NONREACTIVE

## 2017-06-22 LAB — GC/CHLAMYDIA PROBE AMP (~~LOC~~) NOT AT ARMC
CHLAMYDIA, DNA PROBE: NEGATIVE
Neisseria Gonorrhea: NEGATIVE

## 2017-06-28 ENCOUNTER — Encounter: Payer: Medicaid Other | Admitting: Obstetrics & Gynecology

## 2017-06-30 ENCOUNTER — Ambulatory Visit (HOSPITAL_COMMUNITY)
Admission: RE | Admit: 2017-06-30 | Discharge: 2017-06-30 | Disposition: A | Discharge status: Home / Self Care | Payer: Medicaid Other | Source: Ambulatory Visit | Attending: Obstetrics & Gynecology | Admitting: Obstetrics & Gynecology

## 2017-06-30 ENCOUNTER — Other Ambulatory Visit: Payer: Self-pay | Admitting: Obstetrics & Gynecology

## 2017-06-30 DIAGNOSIS — Z3A19 19 weeks gestation of pregnancy: Secondary | ICD-10-CM | POA: Insufficient documentation

## 2017-06-30 DIAGNOSIS — Z3482 Encounter for supervision of other normal pregnancy, second trimester: Secondary | ICD-10-CM | POA: Insufficient documentation

## 2017-06-30 DIAGNOSIS — Z3689 Encounter for other specified antenatal screening: Secondary | ICD-10-CM | POA: Diagnosis present

## 2017-06-30 DIAGNOSIS — Z3480 Encounter for supervision of other normal pregnancy, unspecified trimester: Secondary | ICD-10-CM

## 2017-07-25 ENCOUNTER — Telehealth: Payer: Self-pay

## 2017-07-25 NOTE — Telephone Encounter (Signed)
Called left voicemail on mom phone. Pt no showed for appt on 11/21. Last seen 10/24. Need appt.

## 2017-08-08 NOTE — L&D Delivery Note (Signed)
Delivery Note At  a viable female was delivered via  (Presentation: ; LOA ).  APGAR: 8,9 ; weight  .   Placenta status: spont, via shultz.  Cord:3vc  with the following complications:none .  Cord pH: n/a  Anesthesia: epidural  Episiotomy:  none Lacerations:  nne Suture Repair: n/a Est. Blood Loss 100(mL):    Mom to postpartum.  Baby to Couplet care / Skin to Skin.  Phyllis Kidd 11/26/2017, 12:42 PM

## 2017-08-18 ENCOUNTER — Encounter: Payer: Medicaid Other | Admitting: Obstetrics

## 2017-08-22 ENCOUNTER — Ambulatory Visit (INDEPENDENT_AMBULATORY_CARE_PROVIDER_SITE_OTHER): Payer: Medicaid Other | Admitting: Certified Nurse Midwife

## 2017-08-22 ENCOUNTER — Encounter: Payer: Self-pay | Admitting: Certified Nurse Midwife

## 2017-08-22 VITALS — BP 110/74 | HR 99 | Wt 170.5 lb

## 2017-08-22 DIAGNOSIS — O0932 Supervision of pregnancy with insufficient antenatal care, second trimester: Secondary | ICD-10-CM

## 2017-08-22 DIAGNOSIS — O09899 Supervision of other high risk pregnancies, unspecified trimester: Secondary | ICD-10-CM

## 2017-08-22 DIAGNOSIS — O09892 Supervision of other high risk pregnancies, second trimester: Secondary | ICD-10-CM

## 2017-08-22 DIAGNOSIS — Z3480 Encounter for supervision of other normal pregnancy, unspecified trimester: Secondary | ICD-10-CM

## 2017-08-22 DIAGNOSIS — Z283 Underimmunization status: Secondary | ICD-10-CM

## 2017-08-22 MED ORDER — OB COMPLETE PETITE 35-5-1-200 MG PO CAPS: 1.0000 | ORAL_CAPSULE | Freq: Every day | ORAL | 12 refills | Status: DC

## 2017-08-22 NOTE — Progress Notes (Signed)
   PRENATAL VISIT NOTE  Subjective:  Phyllis Kidd is a 22 y.o. G2P1001 at 5838w4d being seen today for ongoing prenatal care.  She is currently monitored for the following issues for this low-risk pregnancy and has Limited prenatal care in second trimester; Supervision of other normal pregnancy, antepartum; Susceptible to varicella (non-immune), currently pregnant; and Bacterial vaginitis on their problem list.  Patient reports no complaints.  Contractions: Not present. Vag. Bleeding: None.  Movement: Present. Denies leaking of fluid.   The following portions of the patient's history were reviewed and updated as appropriate: allergies, current medications, past family history, past medical history, past social history, past surgical history and problem list. Problem list updated.  Objective:   Vitals:   08/22/17 1344  BP: 110/74  Pulse: 99  Weight: 170 lb 8 oz (77.3 kg)    Fetal Status: Fetal Heart Rate (bpm): 141; doppler Fundal Height: 26 cm Movement: Present     General:  Alert, oriented and cooperative. Patient is in no acute distress.  Skin: Skin is warm and dry. No rash noted.   Cardiovascular: Normal heart rate noted  Respiratory: Normal respiratory effort, no problems with respiration noted  Abdomen: Soft, gravid, appropriate for gestational age.  Pain/Pressure: Absent     Pelvic: Cervical exam deferred        Extremities: Normal range of motion.  Edema: None  Mental Status:  Normal mood and affect. Normal behavior. Normal judgment and thought content.   Assessment and Plan:  Pregnancy: G2P1001 at 1638w4d  1. Supervision of other normal pregnancy, antepartum      - Genetic Screening - US MFM OB FOLLOW UP; Future - Prenat-FeCbn-FeAspGl-FA-Omega (OB COMPLETE PETITE) 35-5-1-200 MG CAPS; Take 1 tablet by mouth daily.  Dispense: 30 capsule; Refill: 12  2. Susceptible to varicella (non-immune), currently pregnant     Varicella postpartum  3. Limited prenatal care in second  trimester     Has not been seen in office since 05/31/17 for initial prenatal visit.   Preterm labor symptoms and general obstetric precautions including but not limited to vaginal bleeding, contractions, leaking of fluid and fetal movement were reviewed in detail with the patient. Please refer to After Visit Summary for other counseling recommendations.  Return in about 2 weeks (around 09/05/2017) for ROB, 2 hr OGTT.   Roe Coombsachelle A Denney, CNM

## 2017-08-22 NOTE — Progress Notes (Signed)
Pt denies concerns at this time. 

## 2017-08-22 NOTE — Patient Instructions (Addendum)
Before [facility] Before your baby arrives it is important to:  Have all of the supplies that you will need to care for your baby.  Know where to go if there is an emergency.  Discuss the baby's arrival with other family members.  What supplies will I need?  It is recommended that you have the following supplies: Large Items  Crib.  Crib mattress.  Rear-facing infant car seat. If possible, have a trained professional check to make sure that it is installed correctly.  Feeding  6-8 bottles that are 4-5 oz in size.  6-8 nipples.  Bottle brush.  Sterilizer, or a large pan or kettle with a lid.  A way to boil and cool water.  If you will be breastfeeding: ? Breast pump. ? Nipple cream. ? Nursing bra. ? Breast pads. ? Breast shields.  If you will be formula feeding: ? Formula. ? Measuring cups. ? Measuring spoons.  Bathing  Mild baby soap and baby shampoo.  Petroleum jelly.  Soft cloth towel and washcloth.  Hooded towel.  Cotton balls.  Bath basin.  Other Supplies  Rectal thermometer.  Bulb syringe.  Baby wipes or washcloths for diaper changes.  Diaper bag.  Changing pad.  Clothing, including one-piece outfits and pajamas.  Baby nail clippers.  Receiving blankets.  Mattress pad and sheets for the crib.  Night-light for the baby's room.  Baby monitor.  2 or 3 pacifiers.  Either 24-36 cloth diapers and waterproof diaper covers or a box of disposable diapers. You may need to use as many as 10-12 diapers per day.  How do I prepare for an emergency? Prepare for an emergency by:  Knowing how to get to the nearest hospital.  Listing the phone numbers of your baby's health care providers near your home phone and in your cell phone.  How do I prepare my family?  Decide how to handle visitors.  If you have other children: ? Talk with them about the baby coming home. Ask them how they feel about it. ? Read a book together about  being a new big brother or sister. ? Find ways to let them help you prepare for the new baby. ? Have someone ready to care for them while you are in the hospital. This information is not intended to replace advice given to you by your health care provider. Make sure you discuss any questions you have with your health care provider. Document Released: 07/07/2008 Document Revised: 12/31/2015 Document Reviewed: 07/02/2014 Elsevier Interactive Patient Education  2018 [employer].  Prenatal Care WHAT IS PRENATAL CARE? Prenatal care is the process of caring for a pregnant woman before she gives birth. Prenatal care makes sure that she and her baby remain as healthy as possible throughout pregnancy. Prenatal care may be provided by a midwife, family practice health care provider, or a childbirth and pregnancy specialist (obstetrician). Prenatal care may include physical examinations, testing, treatments, and education on nutrition, lifestyle, and social support services. WHY IS PRENATAL CARE SO IMPORTANT? Early and consistent prenatal care increases the chance that you and your baby will remain healthy throughout your pregnancy. This type of care also decreases a baby's risk of being born too early (prematurely), or being born smaller than expected (small for gestational age). Any underlying medical conditions you may have that could pose a risk during your pregnancy are discussed during prenatal care visits. You will also be monitored regularly for any new conditions that may arise during your pregnancy  so they can be treated quickly and effectively. WHAT HAPPENS DURING PRENATAL CARE VISITS? Prenatal care visits may include the following: Discussion Tell your health care provider about any new signs or symptoms you have experienced since your last visit. These might include:  Nausea or vomiting.  Increased or decreased level of energy.  Difficulty sleeping.  Back or leg pain.  Weight  changes.  Frequent urination.  Shortness of breath with physical activity.  Changes in your skin, such as the development of a rash or itchiness.  Vaginal discharge or bleeding.  Feelings of excitement or nervousness.  Changes in your baby's movements.  You may want to write down any questions or topics you want to discuss with your health care provider and bring them with you to your appointment. Examination During your first prenatal care visit, you will likely have a complete physical exam. Your health care provider will often examine your vagina, cervix, and the position of your uterus, as well as check your heart, lungs, and other body systems. As your pregnancy progresses, your health care provider will measure the size of your uterus and your baby's position inside your uterus. He or she may also examine you for early signs of labor. Your prenatal visits may also include checking your blood pressure and, after about 10-12 weeks of pregnancy, listening to your baby's heartbeat. Testing Regular testing often includes:  Urinalysis. This checks your urine for glucose, protein, or signs of infection.  Blood count. This checks the levels of white and red blood cells in your body.  Tests for sexually transmitted infections (STIs). Testing for STIs at the beginning of pregnancy is routinely done and is required in many states.  Antibody testing. You will be checked to see if you are immune to certain illnesses, such as rubella, that can affect a developing fetus.  Glucose screen. Around 24-28 weeks of pregnancy, your blood glucose level will be checked for signs of gestational diabetes. Follow-up tests may be recommended.  Group B strep. This is a bacteria that is commonly found inside a woman's vagina. This test will inform your health care provider if you need an antibiotic to reduce the amount of this bacteria in your body prior to labor and childbirth.  Ultrasound. Many pregnant  women undergo an ultrasound screening around 18-20 weeks of pregnancy to evaluate the health of the fetus and check for any developmental abnormalities.  HIV (human immunodeficiency virus) testing. Early in your pregnancy, you will be screened for HIV. If you are at high risk for HIV, this test may be repeated during your third trimester of pregnancy.  You may be offered other testing based on your age, personal or family medical history, or other factors. HOW OFTEN SHOULD I PLAN TO SEE MY HEALTH CARE PROVIDER FOR PRENATAL CARE? Your prenatal care check-up schedule depends on any medical conditions you have before, or develop during, your pregnancy. If you do not have any underlying medical conditions, you will likely be seen for checkups:  Monthly, during the first 6 months of pregnancy.  Twice a month during months 7 and 8 of pregnancy.  Weekly starting in the 9th month of pregnancy and until delivery.  If you develop signs of early labor or other concerning signs or symptoms, you may need to see your health care provider more often. Ask your health care provider what prenatal care schedule is best for you. WHAT CAN I DO TO KEEP MYSELF AND MY BABY AS HEALTHY AS POSSIBLE DURING  MY PREGNANCY?  Take a prenatal vitamin containing 400 micrograms (0.4 mg) of folic acid every day. Your health care provider may also ask you to take additional vitamins such as iodine, vitamin D, iron, copper, and zinc.  Take 1500-2000 mg of calcium daily starting at your 20th week of pregnancy until you deliver your baby.  Make sure you are up to date on your vaccinations. Unless directed otherwise by your health care provider: ? You should receive a tetanus, diphtheria, and pertussis (Tdap) vaccination between the 27th and 36th week of your pregnancy, regardless of when your last Tdap immunization occurred. This helps protect your baby from whooping cough (pertussis) after he or she is born. ? You should receive an  annual inactivated influenza vaccine (IIV) to help protect you and your baby from influenza. This can be done at any point during your pregnancy.  Eat a well-rounded diet that includes: ? Fresh fruits and vegetables. ? Lean proteins. ? Calcium-rich foods such as milk, yogurt, hard cheeses, and dark, leafy greens. ? Whole grain breads.  Do noteat seafood high in mercury, including: ? Swordfish. ? Tilefish. ? Shark. ? King mackerel. ? More than 6 oz tuna per week.  Do not eat: ? Raw or undercooked meats or eggs. ? Unpasteurized foods, such as soft cheeses (brie, blue, or feta), juices, and milks. ? Lunch meats. ? Hot dogs that have not been heated until they are steaming.  Drink enough water to keep your urine clear or pale yellow. For many women, this may be 10 or more 8 oz glasses of water each day. Keeping yourself hydrated helps deliver nutrients to your baby and may prevent the start of pre-term uterine contractions.  Do not use any tobacco products including cigarettes, chewing tobacco, or electronic cigarettes. If you need help quitting, ask your health care provider.  Do not drink beverages containing alcohol. No safe level of alcohol consumption during pregnancy has been determined.  Do not use any illegal drugs. These can harm your developing baby or cause a miscarriage.  Ask your health care provider or pharmacist before taking any prescription or over-the-counter medicines, herbs, or supplements.  Limit your caffeine intake to no more than 200 mg per day.  Exercise. Unless told otherwise by your health care provider, try to get 30 minutes of moderate exercise most days of the week. Do not  do high-impact activities, contact sports, or activities with a high risk of falling, such as horseback riding or downhill skiing.  Get plenty of rest.  Avoid anything that raises your body temperature, such as hot tubs and saunas.  If you own a cat, do not empty its litter box.  Bacteria contained in cat feces can cause an infection called toxoplasmosis. This can result in serious harm to the fetus.  Stay away from chemicals such as insecticides, lead, mercury, and cleaning or paint products that contain solvents.  Do not have any X-rays taken unless medically necessary.  Take a childbirth and breastfeeding preparation class. Ask your health care provider if you need a referral or recommendation.  This information is not intended to replace advice given to you by your health care provider. Make sure you discuss any questions you have with your health care provider. Document Released: 07/28/2003 Document Revised: 12/28/2015 Document Reviewed: 10/09/2013 Elsevier Interactive Patient Education  2017 [place] of Pregnancy The third trimester is from week 29 through week 42, months 7 through 9. This trimester is when your unborn  baby (fetus) is growing very fast. At the end of the ninth month, the unborn baby is about 20 inches in length. It weighs about 6-10 pounds. Follow these instructions at home:  Avoid all smoking, herbs, and alcohol. Avoid drugs not approved by your doctor.  Do not use any tobacco products, including cigarettes, chewing tobacco, and electronic cigarettes. If you need help quitting, ask your doctor. You may get counseling or other support to help you quit.  Only take medicine as told by your doctor. Some medicines are safe and some are not during pregnancy.  Exercise only as told by your doctor. Stop exercising if you start having cramps.  Eat regular, healthy meals.  Wear a good support bra if your breasts are tender.  Do not use hot tubs, steam rooms, or saunas.  Wear your seat belt when driving.  Avoid raw meat, uncooked cheese, and liter boxes and soil used by cats.  Take your prenatal vitamins.  Take 1500-2000 milligrams of calcium daily starting at the 20th week of pregnancy until you deliver your  baby.  Try taking medicine that helps you poop (stool softener) as needed, and if your doctor approves. Eat more fiber by eating fresh fruit, vegetables, and whole grains. Drink enough fluids to keep your pee (urine) clear or pale yellow.  Take warm water baths (sitz baths) to soothe pain or discomfort caused by hemorrhoids. Use hemorrhoid cream if your doctor approves.  If you have puffy, bulging veins (varicose veins), wear support hose. Raise (elevate) your feet for 15 minutes, 3-4 times a day. Limit salt in your diet.  Avoid heavy lifting, wear low heels, and sit up straight.  Rest with your legs raised if you have leg cramps or low back pain.  Visit your dentist if you have not gone during your pregnancy. Use a soft toothbrush to brush your teeth. Be gentle when you floss.  You can have sex (intercourse) unless your doctor tells you not to.  Do not travel far distances unless you must. Only do so with your doctor's approval.  Take prenatal classes.  Practice driving to the hospital.  Pack your hospital bag.  Prepare the baby's room.  Go to your doctor visits. Get help if:  You are not sure if you are in labor or if your water has broken.  You are dizzy.  You have mild cramps or pressure in your lower belly (abdominal).  You have a nagging pain in your belly area.  You continue to feel sick to your stomach (nauseous), throw up (vomit), or have watery poop (diarrhea).  You have bad smelling fluid coming from your vagina.  You have pain with peeing (urination). Get help right away if:  You have a fever.  You are leaking fluid from your vagina.  You are spotting or bleeding from your vagina.  You have severe belly cramping or pain.  You lose or gain weight rapidly.  You have trouble catching your breath and have chest pain.  You notice sudden or extreme puffiness (swelling) of your face, hands, ankles, feet, or legs.  You have not felt the baby move in over  an hour.  You have severe headaches that do not go away with medicine.  You have vision changes. This information is not intended to replace advice given to you by your health care provider. Make sure you discuss any questions you have with your health care provider. Document Released: 10/19/2009 Document Revised: 12/31/2015 Document Reviewed: 09/25/2012 Elsevier Interactive  Patient Education  2017 [place] Preterm Birth Preterm birth is when your baby is delivered between 53 weeks and 48 weeks of pregnancy. A full-term pregnancy lasts for at least 37 weeks. Preterm birth can be dangerous for your baby because the last few weeks of pregnancy are an important time for your baby's brain and lungs to grow. Many things can cause a baby to be born early. Sometimes the cause is not known. There are certain factors that make you more likely to experience preterm birth, such as:  Having a previous baby born preterm.  Being pregnant with twins or other multiples.  Having had fertility treatment.  Being overweight or underweight at the start of your pregnancy.  Having any of the following during pregnancy: ? An infection, including a urinary tract infection (UTI) or an STI (sexually transmitted infection). ? High blood pressure. ? Diabetes. ? Vaginal bleeding.  Being age 49 or older.  Being age 35 or younger.  Getting pregnant within 6 months of a previous pregnancy.  Suffering extreme stress or physical or emotional abuse during pregnancy.  Standing for long periods of time during pregnancy, such as working at a job that requires standing.  What are the risks? The most serious risk of preterm birth is that the baby may not survive. This is more likely to happen if a baby is born before 40 weeks. Other risks and complications of preterm birth may include your baby having:  Breathing problems.  Brain damage that affects movement and coordination (cerebral  palsy).  Feeding difficulties.  Vision or hearing problems.  Infections or inflammation of the digestive tract (colitis).  Developmental delays.  Learning disabilities.  Higher risk for diabetes, heart disease, and high blood pressure later in life.  What can I do to lower my risk? Medical care  The most important thing you can do to lower your risk for preterm birth is to get routine medical care during pregnancy (prenatal care). If you have a high risk of preterm birth, you may be referred to a health care provider who specializes in managing high-risk pregnancies (perinatologist). You may be given medicine to help prevent preterm birth. Lifestyle changes Certain lifestyle changes can also lower your risk of preterm birth:  Wait at least 6 months after a pregnancy to become pregnant again.  Try to plan pregnancy for when you are between 70 and 22 years old.  Get to a healthy weight before getting pregnant. If you are overweight, work with your health care provider to safely lose weight.  Do not use any products that contain nicotine or tobacco, such as cigarettes and e-cigarettes. If you need help quitting, ask your health care provider.  Do not drink alcohol.  Do not use drugs.  Where to find support: For more support, consider:  Talking with your health care provider.  Talking with a therapist or substance abuse counselor, if you need help quitting.  Working with a diet and nutrition specialist (dietitian) or a Physiological scientist to maintain a healthy weight.  Joining a support group.  Where to find more information: Learn more about preventing preterm birth from:  Centers for Disease Control and Prevention: VoipObserver.com.br  March of Dimes: marchofdimes.org/complications/premature-babies.aspx  American Pregnancy Association: americanpregnancy.org/labor-and-birth/premature-labor  Contact a health care provider  if:  You have any of the following signs of preterm labor before 37 weeks: ? A change or increase in vaginal discharge. ? Fluid leaking from your vagina. ? Pressure or cramps in your  lower abdomen. ? A backache that does not go away or gets worse. ? Regular tightening (contractions) in your lower abdomen. Summary  Preterm birth means having your baby during weeks 48-37 of pregnancy.  Preterm birth may put your baby at risk for physical and mental problems.  Getting good prenatal care can help prevent preterm birth.  You can lower your risk of preterm birth by making certain lifestyle changes, such as not smoking and not using alcohol. This information is not intended to replace advice given to you by your health care provider. Make sure you discuss any questions you have with your health care provider. Document Released: 09/08/2015 Document Revised: 04/02/2016 Document Reviewed: 04/02/2016 Elsevier Interactive Patient Education  2018 [employer].  Preterm Labor and Birth Information Pregnancy normally lasts 39-41 weeks. Preterm labor is when labor starts early. It starts before you have been pregnant for 37 whole weeks. What are the risk factors for preterm labor? Preterm labor is more likely to occur in women who:  Have an infection while pregnant.  Have a cervix that is short.  Have gone into preterm labor before.  Have had surgery on their cervix.  Are younger than age 75.  Are older than age 84.  Are African American.  Are pregnant with two or more babies.  Take street drugs while pregnant.  Smoke while pregnant.  Do not gain enough weight while pregnant.  Got pregnant right after another pregnancy.  What are the symptoms of preterm labor? Symptoms of preterm labor include:  Cramps. The cramps may feel like the cramps some women get during their period. The cramps may happen with watery poop (diarrhea).  Pain in the belly (abdomen).  Pain in the lower  back.  Regular contractions or tightening. It may feel like your belly is getting tighter.  Pressure in the lower belly that seems to get stronger.  More fluid (discharge) leaking from the vagina. The fluid may be watery or bloody.  Water breaking.  Why is it important to notice signs of preterm labor? Babies who are born early may not be fully developed. They have a higher chance for:  Long-term heart problems.  Long-term lung problems.  Trouble controlling body systems, like breathing.  Bleeding in the brain.  A condition called cerebral palsy.  Learning difficulties.  Death.  These risks are highest for babies who are born before 59 weeks of pregnancy. How is preterm labor treated? Treatment depends on:  How long you were pregnant.  Your condition.  The health of your baby.  Treatment may involve:  Having a stitch (suture) placed in your cervix. When you give birth, your cervix opens so the baby can come out. The stitch keeps the cervix from opening too soon.  Staying at the hospital.  Taking or getting medicines, such as: ? Hormone medicines. ? Medicines to stop contractions. ? Medicines to help the baby's lungs develop. ? Medicines to prevent your baby from having cerebral palsy.  What should I do if I am in preterm labor? If you think you are going into labor too soon, call your doctor right away. How can I prevent preterm labor?  Do not use any tobacco products. ? Examples of these are cigarettes, chewing tobacco, and e-cigarettes. ? If you need help quitting, ask your doctor.  Do not use street drugs.  Do not use any medicines unless you ask your doctor if they are safe for you.  Talk with your doctor before taking any herbal  supplements.  Make sure you gain enough weight.  Watch for infection. If you think you might have an infection, get it checked right away.  If you have gone into preterm labor before, tell your doctor. This information  is not intended to replace advice given to you by your health care provider. Make sure you discuss any questions you have with your health care provider. Document Released: 10/21/2008 Document Revised: 01/05/2016 Document Reviewed: 12/16/2015 Elsevier Interactive Patient Education  2018 [place] and Pregnancy Immunizations, or vaccines, can help to keep you healthy. They can also protect your baby from some diseases until your baby is old enough to safely receive them. If you are pregnant or you are planning a pregnancy, the vaccines that you need are determined by:  Your age.  Your lifestyle.  Your medical history.  Your travel plans.  Your previous vaccines.  The benefits of receiving immunizations during pregnancy usually outweigh the risks:  When the risk of being exposed to a disease is high.  When infection would pose a risk to you or your unborn baby.  When the vaccine is not likely to cause harm.  Should I receive immunizations before pregnancy? If possible, make sure that your vaccines are up to date before you become pregnant. It is safe and important for you to receive weakened viral and weakened bacterial (inactivated) vaccines, as needed, before you are pregnant. Live viral and live bacterial (attenuated) vaccines should be given 1 month or more before pregnancy. Some examples of attenuated vaccines include:  Live attenuated influenza vaccine (LAIV).  Measles, mumps, and rubella (MMR).  Measles, mumps, rubella, and varicella (MMRV).  Rotavirus (RV5 or RV1).  Smallpox.  Typhoid (Ty21a, oral capsule form of the vaccine).  Varicella (VAR).  Shingles.  Yellow fever (YF).  If you become pregnant within 1 month after you have received an attenuated vaccine, contact your health care provider. Should I receive immunizations during pregnancy? It is safe and important for you to receive inactivated vaccines as needed during pregnancy. Until your  baby can receive vaccines, your baby will get some protection from diseases through the vaccines that you receive while you are pregnant. However, some inactivated vaccines have not been thoroughly studied in pregnant women, and at this time, they are not recommended during pregnancy unless the benefits outweigh the risks. One example is the pneumococcal polysaccharide vaccine (PPSV23). In addition, the human papillomavirus (HPV4 or HPV2) vaccine is not recommended during pregnancy. You should receive inactivated influenza (IIV) and adult tetanus, diphtheria, and acellular pertussis (Tdap) vaccines during your pregnancy. The IIV, which is known as "the flu shot," will protect you and your baby (up to 44 months of age) from some complications and strains of influenza. Pregnant women can receive IIV at any time and during any trimester. The Tdap vaccine will help to prevent whooping cough (pertussis) in you and your baby. You should receive 1 dose of this vaccine during each pregnancy. It is recommended that pregnant women receive this vaccine during the 27th-36th weeks of pregnancy. Usually, attenuated vaccines are not given to pregnant women. There is a possible risk of passing the vaccine virus or bacteria to the unborn baby. If you are pregnant and you received an attenuated vaccine, contact your health care provider. Should I receive immunizations after pregnancy? It is safe and important for you to receive vaccines as needed after pregnancy. This is true even if you are breastfeeding. If you did not receive the Tdap vaccine  during your pregnancy, you should receive that vaccine right after you give birth to your baby (delivery). If you are not immune to measles, mumps, rubella, or varicella, you should receive the MMR or MMRV vaccine within days after delivery. Most other vaccines are also safe to receive after pregnancy. What if I am pregnant and I plan to travel internationally? If you are pregnant and  you are planning to travel internationally, talk with your health care provider at least 4-6 weeks before your trip. Discuss precautions or vaccine options. Before you receive vaccines, the risk of disease and immunization should always be determined. Immunizations that are recommended for pregnant international travelers include:  Hepatitis B (HepB).  IIV.  Tetanus and diphtheria (Td) or Tdap.  Hepatitis A (HepA).  Immunizations that should be delayed or given only when benefits outweigh the risk of disease exposure for pregnant international travelers include:  Japanese encephalitis (JE).  Meningococcal meningitis (MPSV4 or MCV4).  PPSV23.  Inactivated polio (IPV).  Rabies.  Typhoid.  YF.  Immunizations that should not be given to pregnant international travelers include:  Tuberculosis (BCG).  MMR.  MMRV.  HPV4 or HPV2.  VAR.  LAIV.  This information is not intended to replace advice given to you by your health care provider. Make sure you discuss any questions you have with your health care provider. Document Released: 08/14/2007 Document Revised: 12/25/2015 Document Reviewed: 09/01/2014 Elsevier Interactive Patient Education  2018 [place]. Glucose Tolerance Test During Pregnancy The glucose tolerance test (GTT) is a blood test used to determine if you have developed a type of diabetes during pregnancy (gestational diabetes). This is when your body does not properly process sugar (glucose) in the food you eat, resulting in high blood glucose levels. Typically, a GTT is done after you have had a 1-hour glucose test with results that indicate you possibly have gestational diabetes. It may also be done if:  You have a history of giving birth to very large babies or have experienced repeated fetal loss (stillbirth).  You have signs and symptoms of diabetes, such as: ? Changes in your vision. ? Tingling or numbness in your hands or feet. ? Changes in hunger,  thirst, and urination not otherwise explained by your pregnancy.  The GTT lasts about 3 hours. You will be given a sugar-water solution to drink at the beginning of the test. You will have blood drawn before you drink the solution and then again 1, 2, and 3 hours after you drink it. You will not be allowed to eat or drink anything else during the test. You must remain at the testing location to make sure that your blood is drawn on time. You should also avoid exercising during the test, because exercise can alter test results. How do I prepare for this test? Eat normally for 3 days prior to the GTT test, including having plenty of carbohydrate-rich foods. Do not eat or drink anything except water during the final 12 hours before the test. In addition, your health care provider may ask you to stop taking certain medicines before the test. What do the results mean? It is your responsibility to obtain your test results. Ask the lab or department performing the test when and how you will get your results. Contact your health care provider to discuss any questions you have about your results. Range of Normal Values Ranges for normal values may vary among different labs and hospitals. You should always check with your health care provider after having  lab work or other tests done to discuss whether your values are considered within normal limits. Normal levels of blood glucose are as follows:  Fasting: less than 105 mg/dL.  1 hour after drinking the solution: less than 190 mg/dL.  2 hours after drinking the solution: less than 165 mg/dL.  3 hours after drinking the solution: less than 145 mg/dL.  Some substances can interfere with GTT results. These may include:  Blood pressure and heart failure medicines, including beta blockers, furosemide, and thiazides.  Anti-inflammatory medicines, including aspirin.  Nicotine.  Some psychiatric medicines.  Meaning of Results Outside Normal Value  Ranges GTT test results that are above normal values may indicate a number of health problems, such as:  Gestational diabetes.  Acute stress response.  Cushing syndrome.  Tumors such as pheochromocytoma or glucagonoma.  Long-term kidney problems.  Pancreatitis.  Hyperthyroidism.  Current infection. AREA PEDIATRIC/FAMILY [place] [address], [address] [place], [place]  [address] Phone - [phone]   Fax - [phone]  [facility] [address] [place], [place] [address] Phone - [phone]   Fax - [place] [address] [place], [place]  [address] Phone - [phone]   Fax - [phone]  [place] [address], [place] [address] [place], [place]  [address] Phone - [phone]   Fax - [phone]  [place] [address] [place], [place]  [address] Phone - [phone]   Fax - [phone]  [facility] [address] [place], [place]  [address] Phone - [phone]   Fax - [place] [address], [place], [place]  [address] Phone - [phone]   Fax - [phone]  [place] [address] [place], [place] [address] [place], [place]  [address] Phone - [phone]   Fax - [place] [address] [place], [place]  [address] Phone - [phone]   Fax - [phone] [facility] [place]. [address] [place], [place]  [address] Phone - [phone]   Fax - [phone]  [facility] [place] [address] [place], [place]  [address] Phone - [phone]   Fax - [phone]  [facility] [place], [place], [place]  [address] Phone - [phone]   Fax - [place] [address] [place], [place]  [address] Phone - [phone]   Fax - [phone]  [facility] [address], [place], [place]  [address] Phone - [place], [place]  [address] Phone - [phone]   Fax - [place] [address] [place], [place]  [address] Phone - [phone]   Fax - [phone]  [place] [address] [place], [place]  [address] Phone - [phone]   Fax - [place]. [place], [place]  [address] Phone - [phone]   Fax - [place], [place], [place]  [address] Phone - [phone]   Fax - [place] [address] [place], [place]  [address] Phone - [phone]   Fax - [phone]  [facility] RUBIN [address], [place], [place]  [place] Phone - [phone]   Fax - [place] [address] [place], [place]  [address] Phone - [phone]   Fax - [phone]  [place] [address] [place], [place]  [address] Phone - [phone]   Fax - [phone] Arnaldo Natal [address] [place], [place]  [address] Phone - [phone]   Fax - [place] [address] [place], [place]  [address] Phone - [phone]   Fax - [place] [address], [place], [place]  [address] Phone - [phone]   Fax - [phone]  [place] MD [address] [place] [address] Phone [phone]  Fax [phone]   Discuss your test results with your health care provider. He or she will use the results to make a diagnosis and determine a treatment plan that is right for you. This  information is not intended to replace advice given to you by your health care provider. Make sure you discuss any questions you have with your health care provider. Document Released: 01/24/2012 Document Revised: 12/31/2015 Document Reviewed: 11/29/2013 Elsevier Interactive Patient Education  [employer].

## 2017-08-25 ENCOUNTER — Ambulatory Visit (HOSPITAL_COMMUNITY)
Admission: RE | Admit: 2017-08-25 | Discharge: 2017-08-25 | Disposition: A | Discharge status: Home / Self Care | Payer: Medicaid Other | Source: Ambulatory Visit | Attending: Certified Nurse Midwife | Admitting: Certified Nurse Midwife

## 2017-08-25 DIAGNOSIS — Z3480 Encounter for supervision of other normal pregnancy, unspecified trimester: Secondary | ICD-10-CM

## 2017-08-25 DIAGNOSIS — Z362 Encounter for other antenatal screening follow-up: Secondary | ICD-10-CM | POA: Diagnosis present

## 2017-08-30 ENCOUNTER — Other Ambulatory Visit: Payer: Self-pay | Admitting: Certified Nurse Midwife

## 2017-08-30 DIAGNOSIS — Z3480 Encounter for supervision of other normal pregnancy, unspecified trimester: Secondary | ICD-10-CM

## 2017-09-01 ENCOUNTER — Encounter: Payer: Self-pay | Admitting: *Deleted

## 2017-09-05 ENCOUNTER — Encounter: Payer: Self-pay | Admitting: Certified Nurse Midwife

## 2017-09-05 ENCOUNTER — Other Ambulatory Visit: Payer: Medicaid Other

## 2017-09-05 ENCOUNTER — Ambulatory Visit (INDEPENDENT_AMBULATORY_CARE_PROVIDER_SITE_OTHER): Payer: Medicaid Other | Admitting: Certified Nurse Midwife

## 2017-09-05 VITALS — BP 114/77 | HR 109 | Wt 168.6 lb

## 2017-09-05 DIAGNOSIS — Z98891 History of uterine scar from previous surgery: Secondary | ICD-10-CM | POA: Insufficient documentation

## 2017-09-05 DIAGNOSIS — Z3480 Encounter for supervision of other normal pregnancy, unspecified trimester: Secondary | ICD-10-CM

## 2017-09-05 DIAGNOSIS — O09899 Supervision of other high risk pregnancies, unspecified trimester: Secondary | ICD-10-CM

## 2017-09-05 DIAGNOSIS — O0933 Supervision of pregnancy with insufficient antenatal care, third trimester: Secondary | ICD-10-CM

## 2017-09-05 DIAGNOSIS — O0932 Supervision of pregnancy with insufficient antenatal care, second trimester: Secondary | ICD-10-CM

## 2017-09-05 DIAGNOSIS — Z283 Underimmunization status: Secondary | ICD-10-CM

## 2017-09-05 DIAGNOSIS — O09893 Supervision of other high risk pregnancies, third trimester: Secondary | ICD-10-CM

## 2017-09-05 NOTE — Addendum Note (Signed)
Addended by: Natale MilchSTALLING, Jovi Alvizo D on: 09/05/2017 12:12 PM   Modules accepted: Orders

## 2017-09-05 NOTE — Patient Instructions (Signed)
AREA PEDIATRIC/FAMILY PRACTICE PHYSICIANS  [facility] [address] [place], [place]  [address] Phone - [phone]   Fax - [phone]  [facility] [address] [place], [place] [address] Phone - [phone]   Fax - [phone]  [facility] [address] [place], [address] [place]  [address] Phone - [phone]   Fax - [phone]  [facility] [address] [place], [place]  [address] Phone - [phone]   Fax - [phone]  [facility] [address] [place], [place]  [address] Phone - [phone]   Fax - [phone]  [facility] [address] [place], [place]  [address] Phone - [phone]   Fax - [phone]  [facility] [place] [address] [place], [place]  [address] Phone - [phone]   Fax - [phone]  [facility] [address] [place], [place]  [address] Phone - [phone]   Fax - [phone]  [facility] [address] [place], [place]  [address] Phone - [phone]   Fax - [phone] [facility] [address] [place], [place]  [address] Phone - [phone]   Fax - [phone]  [facility] [address] [place], [place]  [address] Phone - [phone]   Fax - [phone]  [facility] [address] [place], [place]  [address] Phone - [phone]   Fax - [phone]  [facility] [address] [place], [place]  [address] Phone - [phone]   Fax - [phone]  SHILPA GOSRANI [address] [place], [place]  [address] Phone - [phone]  [place] [facility] [address] [place], [place]  [address] Phone - [phone]   Fax - [phone]  [facility] [address] [place], [place]  [address] Phone - [phone]   Fax - [phone]  [facility] [address] [place], [place]  [address] Phone - [phone]   Fax - [phone]  [facility] [address] [place], [place]  [address] Phone - [phone]   Fax - [phone]   [facility] [address] [place], [place]  [address] Phone - [phone]   Fax - [phone]  [facility] [address] [place], [place]  [address] Phone - [phone]   Fax - [phone]  [facility] RUB[facility] [address] [place], [place]  [address] Phone - [phone]   Fax - [phone]  [facility] [address] , [place]  [address] Phone - [phone]   Fax - [phone]  [facility] [address] , [place]  [address] Phone - [phone]   Fax - [phone] [facility] [place] [address] [place], [place]  [address] Phone - [phone]   Fax - [phone]  [facility] [address] [place],   [address] Phone - [phone]   Fax - [phone]  [facility] [address] [place],   [address] Phone - [phone]   Fax - [phone]  [facility] [place] Charlene Flemming MD [address] [place]  [address] Phone [phone]  Fax [phone]   

## 2017-09-05 NOTE — Progress Notes (Signed)
   PRENATAL VISIT NOTE  Subjective:  Phyllis Kidd is a 22 y.o. G2P1001 at 3380w4d being seen today for ongoing prenatal care.  She is currently monitored for the following issues for this low-risk pregnancy and has Limited prenatal care in second trimester; Supervision of other normal pregnancy, antepartum; Susceptible to varicella (non-immune), currently pregnant; Bacterial vaginitis; and History of C-section on their problem list.  Patient reports no complaints.  Contractions: Irregular. Vag. Bleeding: None.  Movement: Present. Denies leaking of fluid.   The following portions of the patient's history were reviewed and updated as appropriate: allergies, current medications, past family history, past medical history, past social history, past surgical history and problem list. Problem list updated.  Objective:   Vitals:   09/05/17 0932  BP: 114/77  Pulse: (!) 109  Weight: 168 lb 9.6 oz (76.5 kg)    Fetal Status: Fetal Heart Rate (bpm): 154; doppler Fundal Height: 28 cm Movement: Present     General:  Alert, oriented and cooperative. Patient is in no acute distress.  Skin: Skin is warm and dry. No rash noted.   Cardiovascular: Normal heart rate noted  Respiratory: Normal respiratory effort, no problems with respiration noted  Abdomen: Soft, gravid, appropriate for gestational age.  Pain/Pressure: Present     Pelvic: Cervical exam deferred        Extremities: Normal range of motion.  Edema: Trace  Mental Status:  Normal mood and affect. Normal behavior. Normal judgment and thought content.   Assessment and Plan:  Pregnancy: G2P1001 at 6380w4d  1. Supervision of other normal pregnancy, antepartum     Doing well  2. Susceptible to varicella (non-immune), currently pregnant     Varicella postpartum  3. Limited prenatal care in second trimester     3rd PNV today  4. History of C-section     Desires [facility].   Preterm labor symptoms and general  obstetric precautions including but not limited to vaginal bleeding, contractions, leaking of fluid and fetal movement were reviewed in detail with the patient. Please refer to After Visit Summary for other counseling recommendations.  Return in about 2 weeks (around 09/19/2017) for ROB.   Roe Coombsachelle A Jerone Cudmore, CNM

## 2017-09-06 ENCOUNTER — Emergency Department (HOSPITAL_COMMUNITY)
Admission: EM | Admit: 2017-09-06 | Discharge: 2017-09-06 | Disposition: A | Discharge status: Home / Self Care | Payer: Medicaid Other | Attending: Emergency Medicine | Admitting: Emergency Medicine

## 2017-09-06 ENCOUNTER — Other Ambulatory Visit: Payer: Self-pay | Admitting: Certified Nurse Midwife

## 2017-09-06 ENCOUNTER — Other Ambulatory Visit: Payer: Self-pay

## 2017-09-06 ENCOUNTER — Encounter (HOSPITAL_COMMUNITY): Payer: Self-pay | Admitting: *Deleted

## 2017-09-06 DIAGNOSIS — Z5321 Procedure and treatment not carried out due to patient leaving prior to being seen by health care provider: Secondary | ICD-10-CM | POA: Diagnosis not present

## 2017-09-06 DIAGNOSIS — O26892 Other specified pregnancy related conditions, second trimester: Secondary | ICD-10-CM | POA: Diagnosis present

## 2017-09-06 DIAGNOSIS — Z3480 Encounter for supervision of other normal pregnancy, unspecified trimester: Secondary | ICD-10-CM

## 2017-09-06 LAB — COMPREHENSIVE METABOLIC PANEL
ALBUMIN: 3.1 g/dL — AB (ref 3.5–5.0)
ALT: 11 U/L — ABNORMAL LOW (ref 14–54)
ANION GAP: 9 (ref 5–15)
AST: 14 U/L — AB (ref 15–41)
Alkaline Phosphatase: 44 U/L (ref 38–126)
BUN: 5 mg/dL — ABNORMAL LOW (ref 6–20)
CALCIUM: 8.8 mg/dL — AB (ref 8.9–10.3)
CO2: 21 mmol/L — ABNORMAL LOW (ref 22–32)
CREATININE: 0.51 mg/dL (ref 0.44–1.00)
Chloride: 106 mmol/L (ref 101–111)
GFR calc non Af Amer: >60 mL/min (ref >60)
Glucose, Bld: 81 mg/dL (ref 65–99)
Potassium: 3.5 mmol/L (ref 3.5–5.1)
Sodium: 136 mmol/L (ref 135–145)
TOTAL PROTEIN: 6.8 g/dL (ref 6.5–8.1)
Total Bilirubin: 0.4 mg/dL (ref 0.3–1.2)

## 2017-09-06 LAB — CBC
HCT: 31.6 % — ABNORMAL LOW (ref 36.0–46.0)
HEMATOCRIT: 33.8 % — AB (ref 34.0–46.6)
Hemoglobin: 10.9 g/dL — ABNORMAL LOW (ref 11.1–15.9)
Hemoglobin: 10.5 g/dL — ABNORMAL LOW (ref 12.0–15.0)
MCH: 29.2 pg (ref 26.6–33.0)
MCH: 29.5 pg (ref 26.0–34.0)
MCHC: 32.2 g/dL (ref 31.5–35.7)
MCHC: 33.2 g/dL (ref 30.0–36.0)
MCV: 91 fL (ref 79–97)
MCV: 88.8 fL (ref 78.0–100.0)
PLATELETS: 262 10*3/uL (ref 150–379)
Platelets: 236 10*3/uL (ref 150–400)
RBC: 3.73 x10E6/uL — ABNORMAL LOW (ref 3.77–5.28)
RBC: 3.56 MIL/uL — ABNORMAL LOW (ref 3.87–5.11)
RDW: 14.6 % (ref 12.3–15.4)
RDW: 13.4 % (ref 11.5–15.5)
WBC: 7.2 10*3/uL (ref 3.4–10.8)
WBC: 6.1 10*3/uL (ref 4.0–10.5)

## 2017-09-06 LAB — RPR: RPR Ser Ql: NONREACTIVE

## 2017-09-06 LAB — URINALYSIS, ROUTINE W REFLEX MICROSCOPIC
BILIRUBIN URINE: NEGATIVE
Glucose, UA: NEGATIVE mg/dL
HGB URINE DIPSTICK: NEGATIVE
Ketones, ur: NEGATIVE mg/dL
NITRITE: NEGATIVE
PROTEIN: NEGATIVE mg/dL
Specific Gravity, Urine: 1.018 (ref 1.005–1.030)
pH: 7 (ref 5.0–8.0)

## 2017-09-06 LAB — GLUCOSE TOLERANCE, 2 HOURS W/ 1HR
GLUCOSE, 1 HOUR: 129 mg/dL (ref 65–179)
GLUCOSE, 2 HOUR: 97 mg/dL (ref 65–152)
Glucose, Fasting: 72 mg/dL (ref 65–91)

## 2017-09-06 LAB — HIV ANTIBODY (ROUTINE TESTING W REFLEX): HIV Screen 4th Generation wRfx: NONREACTIVE

## 2017-09-06 MED ORDER — CITRANATAL BLOOM 90-1 MG PO TABS: 1.0000 | ORAL_TABLET | Freq: Every day | ORAL | 12 refills | Status: DC

## 2017-09-06 NOTE — ED Notes (Addendum)
Patient informed Onalee HuaDavid, EMT that she was waiting on a ride to be taken to [facility].  Will d/c

## 2017-09-06 NOTE — ED Triage Notes (Signed)
Pt reports ongoing lower abd pain and pelvic pressure x 2 weeks. Denies vaginal bleeding or discharge but does having vaginal skin irritation. Pt is approx [redacted] weeks pregnant.

## 2017-09-07 ENCOUNTER — Encounter (HOSPITAL_COMMUNITY): Payer: Self-pay | Admitting: *Deleted

## 2017-09-07 ENCOUNTER — Inpatient Hospital Stay (HOSPITAL_COMMUNITY)
Admission: AD | Admit: 2017-09-07 | Discharge: 2017-09-07 | Disposition: A | Discharge status: Home / Self Care | Payer: Medicaid Other | Source: Ambulatory Visit | Attending: Obstetrics and Gynecology | Admitting: Obstetrics and Gynecology

## 2017-09-07 DIAGNOSIS — R102 Pelvic and perineal pain unspecified side: Secondary | ICD-10-CM

## 2017-09-07 DIAGNOSIS — N898 Other specified noninflammatory disorders of vagina: Secondary | ICD-10-CM

## 2017-09-07 DIAGNOSIS — N949 Unspecified condition associated with female genital organs and menstrual cycle: Secondary | ICD-10-CM | POA: Diagnosis not present

## 2017-09-07 DIAGNOSIS — B009 Herpesviral infection, unspecified: Secondary | ICD-10-CM | POA: Diagnosis not present

## 2017-09-07 DIAGNOSIS — O98313 Other infections with a predominantly sexual mode of transmission complicating pregnancy, third trimester: Secondary | ICD-10-CM | POA: Insufficient documentation

## 2017-09-07 DIAGNOSIS — O26893 Other specified pregnancy related conditions, third trimester: Secondary | ICD-10-CM | POA: Insufficient documentation

## 2017-09-07 DIAGNOSIS — Z3A28 28 weeks gestation of pregnancy: Secondary | ICD-10-CM | POA: Diagnosis not present

## 2017-09-07 DIAGNOSIS — A6000 Herpesviral infection of urogenital system, unspecified: Secondary | ICD-10-CM | POA: Diagnosis not present

## 2017-09-07 DIAGNOSIS — O98513 Other viral diseases complicating pregnancy, third trimester: Secondary | ICD-10-CM

## 2017-09-07 LAB — URINALYSIS, ROUTINE W REFLEX MICROSCOPIC
Bacteria, UA: NONE SEEN
Bilirubin Urine: NEGATIVE
Glucose, UA: NEGATIVE mg/dL
Hgb urine dipstick: NEGATIVE
Ketones, ur: NEGATIVE mg/dL
Nitrite: NEGATIVE
Protein, ur: 30 mg/dL — AB
Specific Gravity, Urine: 1.027 (ref 1.005–1.030)
pH: 5 (ref 5.0–8.0)

## 2017-09-07 LAB — WET PREP, GENITAL
Clue Cells Wet Prep HPF POC: NONE SEEN
Sperm: NONE SEEN
Trich, Wet Prep: NONE SEEN
Yeast Wet Prep HPF POC: NONE SEEN

## 2017-09-07 MED ORDER — LIDOCAINE HCL 2 % EX GEL: 1.0000 "application " | CUTANEOUS | 0 refills | Status: DC | PRN

## 2017-09-07 MED ORDER — VALACYCLOVIR HCL 500 MG PO TABS: ORAL_TABLET | ORAL | 2 refills | Status: DC

## 2017-09-07 NOTE — MAU Provider Note (Signed)
History  CSN: [id number] Arrival date and time: 09/07/17 1144    Chief Complaint  Patient presents with  . Vaginal Itching  . Vaginal Pain   HPI: Phyllis Kidd is a 22 yo G2P1001 with IUP at [redacted]w[redacted]d who presents to MAU for complaint of vaginal pain and irritation.  The patient reports that 3 days ago while showering she wiped her vagina with a soapy washcloth. Later that day she noticed that she started having bumps around the outside and inside of her vagina.  She reports that the bumps are painful when urinating or with increased abdominal pressure.  She says the bumps are also itchy at times.  She has never had these bumps before.  She has been unable to look at the bumps due to their location and does not know if they have increased or decreased in size or area over the past 3 days.  She reports that she started using vaginal wipes from a pink box ([employer]) last week due to a family member recommendation. She reports using the wipes 2 hours after shaving last week.  She denies any vaginal bleeding, vaginal discharge, fever, chills, increased urinary frequency, nausea, vomiting, or change in bowel movements. She reports last IC 3 weeks ago. She denies change in partner since becoming pregnant.   [facility].    OB History    Gravida Para Term Preterm AB Living   2 1 1  0 0 1   SAB TAB Ectopic Multiple Live Births   0 0 0   1      Past Medical History:  Diagnosis Date  . Anxiety 2016    Past Surgical History:  Procedure Laterality Date  . CESAREAN SECTION  01/18/2016    History reviewed. No pertinent family history.  Social History   Tobacco Use  . Smoking status: Never Smoker  . Smokeless tobacco: Never Used  Substance Use Topics  . Alcohol use: No  . Drug use: No    Allergies: No Known Allergies  Medications Prior to Admission  Medication Sig Dispense Refill Last Dose  . Prenatal-DSS-FeCb-FeGl-FA (CITRANATAL BLOOM) 90-1 MG TABS Take 1 tablet  by mouth daily. 30 tablet 12 09/06/2017 at Unknown time  . Elastic Bandages & Supports (COMFORT FIT MATERNITY SUPP SM) MISC 1 Units daily as needed by Does not apply route. (Patient not taking: Reported on 08/22/2017) 1 each 0 Not Taking    Review of Systems  Constitutional: Negative for chills, fatigue and fever.  Respiratory: Negative.   Cardiovascular: Negative.   Gastrointestinal: Positive for abdominal pain. Negative for constipation, diarrhea, nausea and vomiting.  Genitourinary: Positive for dysuria and vaginal pain. Negative for difficulty urinating, frequency, hematuria, pelvic pain, vaginal bleeding and vaginal discharge.  Musculoskeletal: Negative.   Neurological: Negative.   Psychiatric/Behavioral: Negative.    Physical Exam   Vitals:   09/07/17 1152 09/07/17 1336  BP: 116/62 115/78  Pulse: 98 88  Resp: 15 18  Temp: 98.1 F (36.7 C)   TempSrc: Oral   SpO2: 100%   Weight: 169 lb (76.7 kg)   Height: 5\' 3"  (1.6 m)    Physical Exam  Constitutional: She is oriented to person, place, and time. She appears well-developed and well-nourished. No distress.  HENT:  Head: Normocephalic and atraumatic.  Eyes: Conjunctivae are normal.  Cardiovascular: Normal rate and regular rhythm. Exam reveals no gallop and no friction rub.  No murmur heard. Respiratory: Effort normal and breath sounds normal. She has no wheezes.  GI: There is no tenderness.  Genitourinary:  Genitourinary Comments: NEFG.  Vesicles on an erythematous base measuring 2-24mm located on both left and right labia.  Cervix pink, closed, with grey/white plaques surrounding the external os.  Positive CMT.    Musculoskeletal: She exhibits no edema.  Neurological: She is oriented to person, place, and time.  Skin: Skin is warm and dry.  Psychiatric: She has a normal mood and affect. Her behavior is normal.   Pt reports that she is "always sensitive" when check her cervix   Fetal Monitoring: baseline 140/ moderate  variability/ +accels (10x10) with no decelerations  Contractions: none by palpation and TOCO     MAU Course   Patient hemodynamically stable. Labs ordered and reviewed.  Orders Placed This Encounter  Procedures  . Wet prep, genital  . HSV Culture And Typing  . Urinalysis, Routine w reflex microscopic  UA- negative  Wet prep- negative  GC/C- pending  HSV- pending   MDM: The differential for the patient's presentation and pain include HSV outbreak, STI, and contact dermatitis.  Most likely the patient has a primary HSV infection along with STI.  The patient's pain symptoms are consistent with HSV outbreak, and vesicles noted on physical exam are consistent with this diagnosis.  Physical exam was also notable for grey/white discharge and CMT, which suggests possible concomitant STI infection.  HSV and GC/Ch NAAT labs will be sent for confirmation of diagnosis.  Will start primary treatment for HSV infection at this time and suppression HSV due to patient being in the third trimester. Patient is unsure whether she wants a repeat C/S or VBAC.   Assessment and Plan   1. Herpes simplex virus type 2 (HSV-2) infection affecting pregnancy in third trimester   2. Genital lesion, female   3. Vaginal pain   4. Vaginal itching    Plan: -- start patient on 1000mg  valacyclovir BID for 10 days, followed by 500mg  BID for HSV suppression -- Rx for Lidocaine jelly for labial pain  -- f/u HSV, GC/Ch testing; start STI treatment if STI positive; will call patient with results of GC/C and HSV  -- discharge patient in stable condition -- return precautions given to patient -- [facility]  Allergies as of 09/07/2017   No Known Allergies     Medication List    TAKE these medications   CITRANATAL BLOOM 90-1 MG Tabs Take 1 tablet by mouth daily.   COMFORT FIT MATERNITY SUPP SM Misc 1 Units daily as needed by Does not apply route.   lidocaine 2 % jelly Commonly known as:   XYLOCAINE Apply 1 application topically as needed.   valACYclovir 500 MG tablet Commonly known as:  VALTREX Take two tablets by mouth twice daily for ten days; then one tablet twice daily for remainder of pregnancy       Mikal PlaneBenjamin A Bortner 09/07/2017, 1:32 PM   I confirm that I have verified the information documented in the medical student's note and that I have also personally reperformed the physical exam and all medical decision making activities.  Sharyon CableVeronica C Tamika Nou, CNM 09/07/17, 2:13 PM

## 2017-09-07 NOTE — MAU Note (Signed)
Pt reports vaginal pressure and irritation. States there are some bumps in vaginal area also.

## 2017-09-08 LAB — GC/CHLAMYDIA PROBE AMP (~~LOC~~) NOT AT ARMC
Chlamydia: NEGATIVE
Neisseria Gonorrhea: NEGATIVE

## 2017-09-10 LAB — HSV CULTURE AND TYPING

## 2017-09-11 ENCOUNTER — Telehealth: Payer: Self-pay | Admitting: Certified Nurse Midwife

## 2017-09-11 ENCOUNTER — Encounter: Payer: Self-pay | Admitting: Certified Nurse Midwife

## 2017-09-11 ENCOUNTER — Encounter: Payer: Self-pay | Admitting: *Deleted

## 2017-09-11 DIAGNOSIS — A6004 Herpesviral vulvovaginitis: Secondary | ICD-10-CM | POA: Insufficient documentation

## 2017-09-11 NOTE — Telephone Encounter (Signed)
Called to give results of positive HSV culture. Patient has treatment medication and supplementation for remainder of pregnancy ordered during MAU visit. Unable to leave message due to no answer and busy signal.

## 2017-09-19 ENCOUNTER — Telehealth: Payer: Self-pay

## 2017-09-19 ENCOUNTER — Encounter: Payer: Medicaid Other | Admitting: Certified Nurse Midwife

## 2017-09-19 NOTE — Telephone Encounter (Signed)
Returned call and advised of lab results and PNV sent by provider.

## 2017-10-12 ENCOUNTER — Encounter: Payer: Medicaid Other | Admitting: Certified Nurse Midwife

## 2017-11-01 ENCOUNTER — Telehealth: Payer: Self-pay | Admitting: *Deleted

## 2017-11-01 NOTE — Telephone Encounter (Signed)
Lft patient a voicemail other number in the sytem that was provided was not in service....  Informed patient to call us to get rescheduled..Marland Kitchen

## 2017-11-17 ENCOUNTER — Encounter (HOSPITAL_COMMUNITY): Payer: Self-pay | Admitting: *Deleted

## 2017-11-17 ENCOUNTER — Inpatient Hospital Stay (HOSPITAL_COMMUNITY)
Admission: AD | Admit: 2017-11-17 | Discharge: 2017-11-17 | Disposition: A | Discharge status: Home / Self Care | Payer: Medicaid Other | Source: Ambulatory Visit | Attending: Obstetrics and Gynecology | Admitting: Obstetrics and Gynecology

## 2017-11-17 DIAGNOSIS — O471 False labor at or after 37 completed weeks of gestation: Secondary | ICD-10-CM | POA: Diagnosis not present

## 2017-11-17 DIAGNOSIS — Z3A39 39 weeks gestation of pregnancy: Secondary | ICD-10-CM | POA: Diagnosis not present

## 2017-11-17 NOTE — MAU Note (Signed)
Pt C/O contractions every 6-7 minutes, has been having contractions for 2 weeks, have been more severe for the last week.  Increased pelvic pressure for the last 3 days.  Denies bleeding or LOF.  Reports good fetal movement.

## 2017-11-17 NOTE — H&P (Deleted)
History     CSN: [id number]  Arrival date & time 11/17/17  1032   None     Chief Complaint  Patient presents with  . Contractions    HPI 22 year old G2P1 at 7039.0 who presents for contractions. She states that she started feeling them this morning and they are occurring every 6-7 minutes. Her pain is so bad that she feels like she can barely walk. She was initially checked and was fingertip on presentation. After waiting one hour, she was rechecked and still fingertip and thick.  It appears that she has received limited prenatal care in the third trimester. Her GBS status appears to be unknown. Patient is planned for a TOLAC after receiving a previous C-section for failure to progress.  Past Medical History:  Diagnosis Date  . Anxiety 2016    Past Surgical History:  Procedure Laterality Date  . CESAREAN SECTION  01/18/2016    No family history on file.  Social History   Tobacco Use  . Smoking status: Never Smoker  . Smokeless tobacco: Never Used  Substance Use Topics  . Alcohol use: No  . Drug use: No    OB History    Gravida  2   Para  1   Term  1   Preterm  0   AB  0   Living  1     SAB  0   TAB  0   Ectopic  0   Multiple      Live Births  1           Review of Systems  Allergies  Patient has no known allergies.  Home Medications    BP 113/68   Pulse 72   Temp 97.9 F (36.6 C) (Oral)   Resp 18   Ht 5\' 2"  (1.575 m)   Wt 83 kg (183 lb)   LMP 03/14/2017   BMI 33.47 kg/m   Physical Exam  Constitutional: She is oriented to person, place, and time. She appears well-developed and well-nourished. No distress.  Eyes: Pupils are equal, round, and reactive to light. Right eye exhibits no discharge. Left eye exhibits no discharge.  Neck: Normal range of motion. Neck supple.  Cardiovascular: Normal rate.  Pulmonary/Chest: Effort normal. No respiratory distress.  Musculoskeletal: Normal range of motion. She exhibits edema (trace).   Neurological: She is alert and oriented to person, place, and time. No cranial nerve deficit.  Skin: Skin is warm. She is not diaphoretic. No erythema.  Psychiatric: She has a normal mood and affect.   Cervix: 0.5/thick/ballotable  MAU Course  Procedures (including critical care time)  Patient likely with false labor given her lack of change of cervical exam. Will plan to send home. GBS culture to be drawn prior to sending home. Patient to schedule follow up appointment at [facility].  Myrene BuddyJacob Jessie Cowher MD PGY-1 Family Medicine Resident  Labs Reviewed  CULTURE, BETA STREP (GROUP B ONLY)   No results found.   1. False labor after 37 completed weeks of gestation

## 2017-11-17 NOTE — Discharge Instructions (Signed)
Braxton Hicks Contractions °Contractions of the uterus can occur throughout pregnancy, but they are not always a sign that you are in labor. You may have practice contractions called Braxton Hicks contractions. These false labor contractions are sometimes confused with true labor. °What are Braxton Hicks contractions? °Braxton Hicks contractions are tightening movements that occur in the muscles of the uterus before labor. Unlike true labor contractions, these contractions do not result in opening (dilation) and thinning of the cervix. Toward the end of pregnancy (32-34 weeks), Braxton Hicks contractions can happen more often and may become stronger. These contractions are sometimes difficult to tell apart from true labor because they can be very uncomfortable. You should not feel embarrassed if you go to the hospital with false labor. °Sometimes, the only way to tell if you are in true labor is for your health care provider to look for changes in the cervix. The health care provider will do a physical exam and may monitor your contractions. If you are not in true labor, the exam should show that your cervix is not dilating and your water has not broken. °If there are other health problems associated with your pregnancy, it is completely safe for you to be sent home with false labor. You may continue to have Braxton Hicks contractions until you go into true labor. °How to tell the difference between true labor and false labor °True labor °· Contractions last 30-70 seconds. °· Contractions become very regular. °· Discomfort is usually felt in the top of the uterus, and it spreads to the lower abdomen and low back. °· Contractions do not go away with walking. °· Contractions usually become more intense and increase in frequency. °· The cervix dilates and gets thinner. °False labor °· Contractions are usually shorter and not as strong as true labor contractions. °· Contractions are usually irregular. °· Contractions  are often felt in the front of the lower abdomen and in the groin. °· Contractions may go away when you walk around or change positions while lying down. °· Contractions get weaker and are shorter-lasting as time goes on. °· The cervix usually does not dilate or become thin. °Follow these instructions at home: °· Take over-the-counter and prescription medicines only as told by your health care provider. °· Keep up with your usual exercises and follow other instructions from your health care provider. °· Eat and drink lightly if you think you are going into labor. °· If Braxton Hicks contractions are making you uncomfortable: °? Change your position from lying down or resting to walking, or change from walking to resting. °? Sit and rest in a tub of warm water. °? Drink enough fluid to keep your urine pale yellow. Dehydration may cause these contractions. °? Do slow and deep breathing several times an hour. °· Keep all follow-up prenatal visits as told by your health care provider. This is important. °Contact a health care provider if: °· You have a fever. °· You have continuous pain in your abdomen. °Get help right away if: °· Your contractions become stronger, more regular, and closer together. °· You have fluid leaking or gushing from your vagina. °· You pass blood-tinged mucus (bloody show). °· You have bleeding from your vagina. °· You have low back pain that you never had before. °· You feel your baby’s head pushing down and causing pelvic pressure. °· Your baby is not moving inside you as much as it used to. °Summary °· Contractions that occur before labor are called Braxton   Hicks contractions, false labor, or practice contractions. °· Braxton Hicks contractions are usually shorter, weaker, farther apart, and less regular than true labor contractions. True labor contractions usually become progressively stronger and regular and they become more frequent. °· Manage discomfort from Braxton Hicks contractions by  changing position, resting in a warm bath, drinking plenty of water, or practicing deep breathing. °This information is not intended to replace advice given to you by your health care provider. Make sure you discuss any questions you have with your health care provider. °Document Released: 12/08/2016 Document Revised: 12/08/2016 Document Reviewed: 12/08/2016 °Elsevier Interactive Patient Education © 2018 [employer]vier Inc. ° °

## 2017-11-17 NOTE — MAU Note (Signed)
..   I have communicated with Dr. Primitivo GauzeFletcher and reviewed vital signs:  Vitals:   11/17/17 1049  BP: 116/68  Pulse: 78  Resp: 18  Temp: 97.9 F (36.6 C)    Vaginal exam:  Dilation: 1 Effacement (%): Thick Cervical Position: Middle Station: Ballotable Exam by:: Talvin Christianson, RN ,   Also reviewed contraction pattern and that non-stress test is reactive.  It has been documented that patient is contracting every irregular with minimal cervical change over one hours not indicating active labor.  Patient denies any other complaints.  Based on this report provider has given order for discharge.  A discharge order and diagnosis entered by a provider.   Labor discharge instructions reviewed with patient.

## 2017-11-17 NOTE — MAU Provider Note (Signed)
Expand All Collapse All       History   CSN: [id number]  Arrival date & time 11/17/17  1032   None        Chief Complaint  Patient presents with  . Contractions    HPI 22 year old G2P1 at 39.0wks who presents for contractions. She states that she started feeling them this morning and they are occurring every 6-7 minutes. Her pain is so bad that she feels like she can barely walk. She was initially checked and was fingertip on presentation. After waiting one hour, she was rechecked and still fingertip and thick.  It appears that she has received limited prenatal care in the third trimester. Her GBS status appears to be unknown. Patient is planned for a TOLAC after receiving a previous C-section for failure to progress.      Past Medical History:  Diagnosis Date  . Anxiety 2016         Past Surgical History:  Procedure Laterality Date  . CESAREAN SECTION  01/18/2016    No family history on file.  Social History       Tobacco Use  . Smoking status: Never Smoker  . Smokeless tobacco: Never Used  Substance Use Topics  . Alcohol use: No  . Drug use: No            OB History    Gravida  2   Para  1   Term  1   Preterm  0   AB  0   Living  1     SAB  0   TAB  0   Ectopic  0   Multiple      Live Births  1           Review of Systems  Allergies  Patient has no known allergies.  Home Medications    BP 113/68   Pulse 72   Temp 97.9 F (36.6 C) (Oral)   Resp 18   Ht 5\' 2"  (1.575 m)   Wt 83 kg (183 lb)   LMP 03/14/2017   BMI 33.47 kg/m   Physical Exam  Constitutional: She is oriented to person, place, and time. She appears well-developed and well-nourished. No distress.  Eyes: Pupils are equal, round, and reactive to light. Right eye exhibits no discharge. Left eye exhibits no discharge.  Neck: Normal range of motion. Neck supple.  Cardiovascular: Normal rate.  Pulmonary/Chest: Effort normal. No  respiratory distress.  Musculoskeletal: Normal range of motion. She exhibits edema (trace).  Neurological: She is alert and oriented to person, place, and time. No cranial nerve deficit.  Skin: Skin is warm. She is not diaphoretic. No erythema.  Psychiatric: She has a normal mood and affect.   Cervix: 0.5/thick/ballotable  MAU Course  Procedures (including critical care time)  Patient likely with false labor given her lack of change of cervical exam. Will plan to send home. GBS culture to be drawn prior to sending home. Patient to schedule follow up appointment at [facility].  Myrene BuddyJacob Fletcher MD PGY-1 Family Medicine Resident  Labs Reviewed  CULTURE, BETA STREP (GROUP B ONLY)   ImagingResults(Last48hours)  No results found.     1. False labor after 37 completed weeks of gestation     CNM attestation:  I have seen and examined this patient; I agree with above documentation in the resident's note.   [name] Ayub is a 22 y.o. G2P1001 reporting reg ctx +FM, denies LOF, VB, vaginal discharge.  PE: BP 113/68   Pulse 72   Temp 97.9 F (36.6 C) (Oral)   Resp 18   Ht 5\' 2"  (1.575 m)   Wt 83 kg (183 lb)   LMP 03/14/2017   BMI 33.47 kg/m  Gen: calm comfortable, NAD Resp: normal effort, no distress Abd: gravid  ROS, labs, PMH reviewed NST reactive, Cat 1, FHR 140s, + LTV, +accels, no decels Difficult to see ctx, possibly q 5-15 mins, no pattern  Plan: - Pt initially reported to be in pain, upon evaluation she was calmly speaking; in 15 mins of being in the room, it didn't appear that she had a ctx - Rev'd labor precautions - Has not been to a [facility] since January; strongly urged pt to call today to schedule a visit at [facility] for next week ASAP -At this time, plans a VBAC (records from prev del at [facility] should be at the [facility] office for review) -GBS culture collected  Cam Hai, CNM 11:59 PM 11/17/17

## 2017-11-19 LAB — CULTURE, BETA STREP (GROUP B ONLY)

## 2017-11-20 ENCOUNTER — Encounter: Payer: Self-pay | Admitting: *Deleted

## 2017-11-20 ENCOUNTER — Encounter: Payer: Self-pay | Admitting: Certified Nurse Midwife

## 2017-11-20 ENCOUNTER — Ambulatory Visit (INDEPENDENT_AMBULATORY_CARE_PROVIDER_SITE_OTHER): Payer: Medicaid Other | Admitting: Certified Nurse Midwife

## 2017-11-20 VITALS — BP 124/80 | HR 84 | Wt 183.0 lb

## 2017-11-20 DIAGNOSIS — A6004 Herpesviral vulvovaginitis: Secondary | ICD-10-CM

## 2017-11-20 DIAGNOSIS — Z3483 Encounter for supervision of other normal pregnancy, third trimester: Secondary | ICD-10-CM

## 2017-11-20 DIAGNOSIS — Z283 Underimmunization status: Secondary | ICD-10-CM

## 2017-11-20 DIAGNOSIS — Z3480 Encounter for supervision of other normal pregnancy, unspecified trimester: Secondary | ICD-10-CM

## 2017-11-20 DIAGNOSIS — O09899 Supervision of other high risk pregnancies, unspecified trimester: Secondary | ICD-10-CM

## 2017-11-20 DIAGNOSIS — Z2839 Other underimmunization status: Secondary | ICD-10-CM

## 2017-11-20 DIAGNOSIS — O09893 Supervision of other high risk pregnancies, third trimester: Secondary | ICD-10-CM

## 2017-11-20 DIAGNOSIS — B951 Streptococcus, group B, as the cause of diseases classified elsewhere: Secondary | ICD-10-CM | POA: Insufficient documentation

## 2017-11-20 DIAGNOSIS — Z98891 History of uterine scar from previous surgery: Secondary | ICD-10-CM

## 2017-11-20 MED ORDER — VALACYCLOVIR HCL 500 MG PO TABS: ORAL_TABLET | ORAL | 2 refills | Status: DC

## 2017-11-20 MED ORDER — CITRANATAL BLOOM 90-1 MG PO TABS: 1.0000 | ORAL_TABLET | Freq: Every day | ORAL | 12 refills | Status: DC

## 2017-11-20 MED ORDER — OB COMPLETE PETITE 35-5-1-200 MG PO CAPS: 1.0000 | ORAL_CAPSULE | Freq: Every day | ORAL | 12 refills | Status: DC

## 2017-11-20 NOTE — Progress Notes (Signed)
   PRENATAL VISIT NOTE  Subjective:  Phyllis Kidd is a 22 y.o. G2P1001 at 8046w3d being seen today for ongoing prenatal care.  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  Patient reports no complaints.  Contractions: Irregular. Vag. Bleeding: None.  Movement: Present. Denies leaking of fluid.   The following portions of the patient's history were reviewed and updated as appropriate: allergies, current medications, past family history, past medical history, past social history, past surgical history and problem list. Problem list updated.  Objective:   Vitals:   11/20/17 1144  BP: 124/80  Pulse: 84  Weight: 183 lb (83 kg)    Fetal Status: Fetal Heart Rate (bpm): 143; doppler Fundal Height: 38 cm Movement: Present     General:  Alert, oriented and cooperative. Patient is in no acute distress.  Skin: Skin is warm and dry. No rash noted.   Cardiovascular: Normal heart rate noted  Respiratory: Normal respiratory effort, no problems with respiration noted  Abdomen: Soft, gravid, appropriate for gestational age.  Pain/Pressure: Present     Pelvic: Cervical exam deferred        Extremities: Normal range of motion.  Edema: Trace  Mental Status: Normal mood and affect. Normal behavior. Normal judgment and thought content.   Assessment and Plan:  Pregnancy: G2P1001 at 2146w3d  1. Supervision of other normal pregnancy, antepartum      Doing well.  [facility] at 39 weeks.  Medications refilled.   2. Susceptible to varicella (non-immune), currently pregnant     Varicella postpartum  3. 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   4.  History of C-section     [facility]AC[facility].    5. Positive GBS test     PCN for labor/delivery discussed.    Term labor symptoms and general obstetric precautions including but not limited to vaginal bleeding, contractions, leaking of fluid and fetal movement were reviewed in detail with the patient. Please refer to After Visit Summary for other counseling recommendations.  Return in about 1 week (around 11/27/2017) for ROB, NST.  Future Appointments  Date Time Provider Department Center  11/27/2017  3:30 PM Roe Coombsenney, Darryl Blumenstein A, CNM [facility] None    Roe Coombsachelle A Saketh Daubert, CNM

## 2017-11-25 ENCOUNTER — Inpatient Hospital Stay (EMERGENCY_DEPARTMENT_HOSPITAL)
Admission: AD | Admit: 2017-11-25 | Discharge: 2017-11-25 | Disposition: A | Discharge status: Home / Self Care | Payer: Medicaid Other | Source: Ambulatory Visit | Attending: Family Medicine | Admitting: Family Medicine

## 2017-11-25 ENCOUNTER — Encounter (HOSPITAL_COMMUNITY): Payer: Self-pay

## 2017-11-25 DIAGNOSIS — A6004 Herpesviral vulvovaginitis: Secondary | ICD-10-CM

## 2017-11-25 DIAGNOSIS — O479 False labor, unspecified: Secondary | ICD-10-CM | POA: Diagnosis not present

## 2017-11-25 HISTORY — DX: Herpesviral infection, unspecified: B00.9

## 2017-11-25 MED ORDER — HYDROCODONE-ACETAMINOPHEN 5-325 MG PO TABS
1.0000 | ORAL_TABLET | Freq: Once | ORAL | Status: DC
  Filled 2017-11-25: qty 1

## 2017-11-25 NOTE — MAU Note (Cosign Needed)
Phyllis Kidd is a 22 y.o. 412P1001 female at 747w1d  RN Labor check, not seen by provider SVE by RN: Dilation: 2.5 Effacement (%): 90 Station: -3 Exam by:: n druebbisch rn NST: FHR baseline 135 bpm, Variability: moderate, Accelerations:present, Decelerations:  Absent= Cat 1/Reactive Toco: irregular  D/C home  Cheral MarkerKimberly R Shy Guallpa CNM, [facility]NP-BC 11/25/2017 10:57 AM

## 2017-11-25 NOTE — MAU Note (Signed)
Pt having ctx started around 2100 last night. Pain 10/10. No bleeding or LOF, +FM

## 2017-11-25 NOTE — MAU Provider Note (Signed)
First Provider Initiated Contact with Patient 11/25/17 [id number]     S: Ms. Cecile HearingShanequa Kidd is a 22 y.o. G2P1001 at 8761w1d  who presents to MAU today complaining of leaking of fluid since cervical exam in MAU a few minutes prior. She denies vaginal bleeding. She endorses contractions. She reports normal fetal movement.    O: BP 117/71 (BP Location: Right Arm)   Pulse 86   Temp 98.2 F (36.8 C) (Oral)   Resp 16   Wt 183 lb (83 kg)   LMP 03/14/2017   SpO2 99%   BMI 33.47 kg/m  GENERAL: Well-developed, well-nourished female in no acute distress.  HEAD: Normocephalic, atraumatic.  CHEST: Normal effort of breathing, regular heart rate ABDOMEN: Soft, nontender, gravid PELVIC: Normal external female genitalia. Vagina is pink and rugated. Cervix with normal contour, no lesions. Normal discharge.  Negative pooling.   Cervical exam:  Dilation: 2 Effacement (%): 90 Cervical Position: Anterior Station: -3 Presentation: Vertex Exam by:: n druebbisch rn   Fetal Monitoring: Baseline: 130 bpm Variability: moderate Accelerations: 10 x 10 Decelerations: none  Contractions: few, irregular    Fern - negative  A: SIUP at 1561w1d  Membranes intact  P: RN to continue RN labor evaluation and consult with CNM on LD  Kathlene CoteWenzel, Kolbie Clarkston N, PA-C 11/25/2017 8:44 AM

## 2017-11-25 NOTE — Discharge Instructions (Signed)
Braxton Hicks Contractions °Contractions of the uterus can occur throughout pregnancy, but they are not always a sign that you are in labor. You may have practice contractions called Braxton Hicks contractions. These false labor contractions are sometimes confused with true labor. °What are Braxton Hicks contractions? °Braxton Hicks contractions are tightening movements that occur in the muscles of the uterus before labor. Unlike true labor contractions, these contractions do not result in opening (dilation) and thinning of the cervix. Toward the end of pregnancy (32-34 weeks), Braxton Hicks contractions can happen more often and may become stronger. These contractions are sometimes difficult to tell apart from true labor because they can be very uncomfortable. You should not feel embarrassed if you go to the hospital with false labor. °Sometimes, the only way to tell if you are in true labor is for your health care provider to look for changes in the cervix. The health care provider will do a physical exam and may monitor your contractions. If you are not in true labor, the exam should show that your cervix is not dilating and your water has not broken. °If there are other health problems associated with your pregnancy, it is completely safe for you to be sent home with false labor. You may continue to have Braxton Hicks contractions until you go into true labor. °How to tell the difference between true labor and false labor °True labor °· Contractions last 30-70 seconds. °· Contractions become very regular. °· Discomfort is usually felt in the top of the uterus, and it spreads to the lower abdomen and low back. °· Contractions do not go away with walking. °· Contractions usually become more intense and increase in frequency. °· The cervix dilates and gets thinner. °False labor °· Contractions are usually shorter and not as strong as true labor contractions. °· Contractions are usually irregular. °· Contractions  are often felt in the front of the lower abdomen and in the groin. °· Contractions may go away when you walk around or change positions while lying down. °· Contractions get weaker and are shorter-lasting as time goes on. °· The cervix usually does not dilate or become thin. °Follow these instructions at home: °· Take over-the-counter and prescription medicines only as told by your health care provider. °· Keep up with your usual exercises and follow other instructions from your health care provider. °· Eat and drink lightly if you think you are going into labor. °· If Braxton Hicks contractions are making you uncomfortable: °? Change your position from lying down or resting to walking, or change from walking to resting. °? Sit and rest in a tub of warm water. °? Drink enough fluid to keep your urine pale yellow. Dehydration may cause these contractions. °? Do slow and deep breathing several times an hour. °· Keep all follow-up prenatal visits as told by your health care provider. This is important. °Contact a health care provider if: °· You have a fever. °· You have continuous pain in your abdomen. °Get help right away if: °· Your contractions become stronger, more regular, and closer together. °· You have fluid leaking or gushing from your vagina. °· You pass blood-tinged mucus (bloody show). °· You have bleeding from your vagina. °· You have low back pain that you never had before. °· You feel your baby’s head pushing down and causing pelvic pressure. °· Your baby is not moving inside you as much as it used to. °Summary °· Contractions that occur before labor are called Braxton   Hicks contractions, false labor, or practice contractions. °· Braxton Hicks contractions are usually shorter, weaker, farther apart, and less regular than true labor contractions. True labor contractions usually become progressively stronger and regular and they become more frequent. °· Manage discomfort from Braxton Hicks contractions by  changing position, resting in a warm bath, drinking plenty of water, or practicing deep breathing. °This information is not intended to replace advice given to you by your health care provider. Make sure you discuss any questions you have with your health care provider. °Document Released: 12/08/2016 Document Revised: 12/08/2016 Document Reviewed: 12/08/2016 °Elsevier Interactive Patient Education © 2018 [employer] Inc. ° °Fetal Movement Counts °Patient Name: ________________________________________________ Patient Due Date: ____________________ °What is a fetal movement count? °A fetal movement count is the number of times that you feel your baby move during a certain amount of time. This may also be called a fetal kick count. A fetal movement count is recommended for every pregnant woman. You may be asked to start counting fetal movements as early as week 28 of your pregnancy. °Pay attention to when your baby is most active. You may notice your baby's sleep and wake cycles. You may also notice things that make your baby move more. You should do a fetal movement count: °· When your baby is normally most active. °· At the same time each day. ° °A good time to count movements is while you are resting, after having something to eat and drink. °How do I count fetal movements? °1. Find a quiet, comfortable area. Sit, or lie down on your side. °2. Write down the date, the start time and stop time, and the number of movements that you felt between those two times. Take this information with you to your health care visits. °3. For 2 hours, count kicks, flutters, swishes, rolls, and jabs. You should feel at least 10 movements during 2 hours. °4. You may stop counting after you have felt 10 movements. °5. If you do not feel 10 movements in 2 hours, have something to eat and drink. Then, keep resting and counting for 1 hour. If you feel at least 4 movements during that hour, you may stop counting. °Contact a health care  provider if: °· You feel fewer than 4 movements in 2 hours. °· Your baby is not moving like he or she usually does. °Date: ____________ Start time: ____________ Stop time: ____________ Movements: ____________ °Date: ____________ Start time: ____________ Stop time: ____________ Movements: ____________ °Date: ____________ Start time: ____________ Stop time: ____________ Movements: ____________ °Date: ____________ Start time: ____________ Stop time: ____________ Movements: ____________ °Date: ____________ Start time: ____________ Stop time: ____________ Movements: ____________ °Date: ____________ Start time: ____________ Stop time: ____________ Movements: ____________ °Date: ____________ Start time: ____________ Stop time: ____________ Movements: ____________ °Date: ____________ Start time: ____________ Stop time: ____________ Movements: ____________ °Date: ____________ Start time: ____________ Stop time: ____________ Movements: ____________ °This information is not intended to replace advice given to you by your health care provider. Make sure you discuss any questions you have with your health care provider. °Document Released: 08/24/2006 Document Revised: 03/23/2016 Document Reviewed: 09/03/2015 °Elsevier Interactive Patient Education © 2018 [employer] Inc. ° °

## 2017-11-26 ENCOUNTER — Inpatient Hospital Stay (HOSPITAL_COMMUNITY): Payer: Medicaid Other | Admitting: Anesthesiology

## 2017-11-26 ENCOUNTER — Encounter (HOSPITAL_COMMUNITY): Payer: Self-pay | Admitting: *Deleted

## 2017-11-26 ENCOUNTER — Other Ambulatory Visit: Payer: Self-pay

## 2017-11-26 ENCOUNTER — Inpatient Hospital Stay (HOSPITAL_COMMUNITY)
Admission: AD | Admit: 2017-11-26 | Discharge: 2017-11-28 | DRG: 806 | Disposition: A | Discharge status: Home / Self Care | Payer: Medicaid Other | Source: Ambulatory Visit | Attending: Family Medicine | Admitting: Family Medicine

## 2017-11-26 DIAGNOSIS — A6004 Herpesviral vulvovaginitis: Secondary | ICD-10-CM | POA: Diagnosis present

## 2017-11-26 DIAGNOSIS — Z283 Underimmunization status: Secondary | ICD-10-CM

## 2017-11-26 DIAGNOSIS — O99824 Streptococcus B carrier state complicating childbirth: Secondary | ICD-10-CM | POA: Diagnosis present

## 2017-11-26 DIAGNOSIS — O34219 Maternal care for unspecified type scar from previous cesarean delivery: Secondary | ICD-10-CM | POA: Diagnosis present

## 2017-11-26 DIAGNOSIS — O9832 Other infections with a predominantly sexual mode of transmission complicating childbirth: Secondary | ICD-10-CM | POA: Diagnosis present

## 2017-11-26 DIAGNOSIS — O480 Post-term pregnancy: Secondary | ICD-10-CM | POA: Diagnosis not present

## 2017-11-26 DIAGNOSIS — Z3480 Encounter for supervision of other normal pregnancy, unspecified trimester: Secondary | ICD-10-CM

## 2017-11-26 DIAGNOSIS — O09899 Supervision of other high risk pregnancies, unspecified trimester: Secondary | ICD-10-CM

## 2017-11-26 DIAGNOSIS — B951 Streptococcus, group B, as the cause of diseases classified elsewhere: Secondary | ICD-10-CM | POA: Diagnosis present

## 2017-11-26 DIAGNOSIS — Z3483 Encounter for supervision of other normal pregnancy, third trimester: Secondary | ICD-10-CM | POA: Diagnosis present

## 2017-11-26 DIAGNOSIS — Z98891 History of uterine scar from previous surgery: Secondary | ICD-10-CM

## 2017-11-26 DIAGNOSIS — O0930 Supervision of pregnancy with insufficient antenatal care, unspecified trimester: Secondary | ICD-10-CM

## 2017-11-26 DIAGNOSIS — Z3A40 40 weeks gestation of pregnancy: Secondary | ICD-10-CM | POA: Diagnosis not present

## 2017-11-26 DIAGNOSIS — A6000 Herpesviral infection of urogenital system, unspecified: Secondary | ICD-10-CM | POA: Diagnosis present

## 2017-11-26 LAB — TYPE AND SCREEN
ABO/RH(D): B POS
Antibody Screen: NEGATIVE

## 2017-11-26 LAB — CBC
HCT: 35.2 % — ABNORMAL LOW (ref 36.0–46.0)
HEMOGLOBIN: 12.3 g/dL (ref 12.0–15.0)
MCH: 31.1 pg (ref 26.0–34.0)
MCHC: 34.9 g/dL (ref 30.0–36.0)
MCV: 88.9 fL (ref 78.0–100.0)
Platelets: 259 10*3/uL (ref 150–400)
RBC: 3.96 MIL/uL (ref 3.87–5.11)
RDW: 13.1 % (ref 11.5–15.5)
WBC: 10.8 10*3/uL — ABNORMAL HIGH (ref 4.0–10.5)

## 2017-11-26 LAB — RPR: RPR: NONREACTIVE

## 2017-11-26 MED ORDER — FENTANYL 2.5 MCG/ML BUPIVACAINE 1/10 % EPIDURAL INFUSION (WH - ANES)
14.0000 mL/h | INTRAMUSCULAR | Status: DC | PRN
  Administered 2017-11-26: 14 mL/h via EPIDURAL

## 2017-11-26 MED ORDER — ONDANSETRON HCL 4 MG PO TABS: 4.0000 mg | ORAL_TABLET | ORAL | Status: DC | PRN

## 2017-11-26 MED ORDER — ACETAMINOPHEN 325 MG PO TABS
650.0000 mg | ORAL_TABLET | ORAL | Status: DC | PRN
  Filled 2017-11-26: qty 2

## 2017-11-26 MED ORDER — FLEET ENEMA 7-19 GM/118ML RE ENEM: 1.0000 | ENEMA | RECTAL | Status: DC | PRN

## 2017-11-26 MED ORDER — EPHEDRINE 5 MG/ML INJ
INTRAVENOUS | Status: AC
  Filled 2017-11-26: qty 4

## 2017-11-26 MED ORDER — ONDANSETRON HCL 4 MG/2ML IJ SOLN
4.0000 mg | Freq: Four times a day (QID) | INTRAMUSCULAR | Status: DC | PRN
  Administered 2017-11-26: 4 mg via INTRAVENOUS
  Filled 2017-11-26: qty 2

## 2017-11-26 MED ORDER — OXYTOCIN 40 UNITS IN LACTATED RINGERS INFUSION - SIMPLE MED
INTRAVENOUS | Status: AC
  Filled 2017-11-26: qty 1000

## 2017-11-26 MED ORDER — LIDOCAINE-EPINEPHRINE (PF) 2 %-1:200000 IJ SOLN
INTRAMUSCULAR | Status: DC | PRN
  Administered 2017-11-26: 5 mL via EPIDURAL
  Administered 2017-11-26: 3 mL via EPIDURAL

## 2017-11-26 MED ORDER — SIMETHICONE 80 MG PO CHEW: 80.0000 mg | CHEWABLE_TABLET | ORAL | Status: DC | PRN

## 2017-11-26 MED ORDER — SENNOSIDES-DOCUSATE SODIUM 8.6-50 MG PO TABS
2.0000 | ORAL_TABLET | ORAL | Status: DC
  Administered 2017-11-27 (×2): 2 via ORAL
  Filled 2017-11-26 (×2): qty 2

## 2017-11-26 MED ORDER — SOD CITRATE-CITRIC ACID 500-334 MG/5ML PO SOLN: 30.0000 mL | ORAL | Status: DC | PRN

## 2017-11-26 MED ORDER — PHENYLEPHRINE 40 MCG/ML (10ML) SYRINGE FOR IV PUSH (FOR BLOOD PRESSURE SUPPORT)
PREFILLED_SYRINGE | INTRAVENOUS | Status: AC
  Filled 2017-11-26: qty 20

## 2017-11-26 MED ORDER — ACETAMINOPHEN 325 MG PO TABS
650.0000 mg | ORAL_TABLET | ORAL | Status: DC | PRN
  Administered 2017-11-26: 650 mg via ORAL

## 2017-11-26 MED ORDER — MEASLES, MUMPS & RUBELLA VAC ~~LOC~~ INJ
0.5000 mL | INJECTION | Freq: Once | SUBCUTANEOUS | Status: DC
  Filled 2017-11-26: qty 0.5

## 2017-11-26 MED ORDER — LIDOCAINE HCL (PF) 1 % IJ SOLN
30.0000 mL | INTRAMUSCULAR | Status: DC | PRN
  Filled 2017-11-26: qty 30

## 2017-11-26 MED ORDER — ONDANSETRON HCL 4 MG/2ML IJ SOLN: 4.0000 mg | INTRAMUSCULAR | Status: DC | PRN

## 2017-11-26 MED ORDER — TETANUS-DIPHTH-ACELL PERTUSSIS 5-2.5-18.5 LF-MCG/0.5 IM SUSP: 0.5000 mL | Freq: Once | INTRAMUSCULAR | Status: DC

## 2017-11-26 MED ORDER — EPHEDRINE 5 MG/ML INJ
10.0000 mg | INTRAVENOUS | Status: DC | PRN
  Filled 2017-11-26: qty 2

## 2017-11-26 MED ORDER — ZOLPIDEM TARTRATE 5 MG PO TABS: 5.0000 mg | ORAL_TABLET | Freq: Every evening | ORAL | Status: DC | PRN

## 2017-11-26 MED ORDER — TERBUTALINE SULFATE 1 MG/ML IJ SOLN
0.2500 mg | Freq: Once | INTRAMUSCULAR | Status: DC | PRN
  Filled 2017-11-26: qty 1

## 2017-11-26 MED ORDER — DIPHENHYDRAMINE HCL 50 MG/ML IJ SOLN: 12.5000 mg | INTRAMUSCULAR | Status: DC | PRN

## 2017-11-26 MED ORDER — OXYTOCIN 40 UNITS IN LACTATED RINGERS INFUSION - SIMPLE MED
1.0000 m[IU]/min | INTRAVENOUS | Status: DC
  Administered 2017-11-26: 2 m[IU]/min via INTRAVENOUS

## 2017-11-26 MED ORDER — COCONUT OIL OIL: 1.0000 "application " | TOPICAL_OIL | Status: DC | PRN

## 2017-11-26 MED ORDER — PRENATAL MULTIVITAMIN CH
1.0000 | ORAL_TABLET | Freq: Every day | ORAL | Status: DC
  Administered 2017-11-27 – 2017-11-28 (×2): 1 via ORAL
  Filled 2017-11-26 (×2): qty 1

## 2017-11-26 MED ORDER — PHENYLEPHRINE 40 MCG/ML (10ML) SYRINGE FOR IV PUSH (FOR BLOOD PRESSURE SUPPORT)
80.0000 ug | PREFILLED_SYRINGE | INTRAVENOUS | Status: DC | PRN
  Filled 2017-11-26: qty 5

## 2017-11-26 MED ORDER — DIBUCAINE 1 % RE OINT: 1.0000 "application " | TOPICAL_OINTMENT | RECTAL | Status: DC | PRN

## 2017-11-26 MED ORDER — FENTANYL 2.5 MCG/ML BUPIVACAINE 1/10 % EPIDURAL INFUSION (WH - ANES)
INTRAMUSCULAR | Status: AC
  Filled 2017-11-26: qty 100

## 2017-11-26 MED ORDER — OXYTOCIN BOLUS FROM INFUSION
500.0000 mL | Freq: Once | INTRAVENOUS | Status: AC
  Administered 2017-11-26: 500 mL via INTRAVENOUS

## 2017-11-26 MED ORDER — SODIUM CHLORIDE 0.9% FLUSH
3.0000 mL | INTRAVENOUS | Status: DC | PRN
  Administered 2017-11-26: 3 mL via INTRAVENOUS
  Filled 2017-11-26: qty 3

## 2017-11-26 MED ORDER — OXYCODONE-ACETAMINOPHEN 5-325 MG PO TABS
2.0000 | ORAL_TABLET | ORAL | Status: DC | PRN
  Administered 2017-11-27 – 2017-11-28 (×6): 2 via ORAL
  Filled 2017-11-26 (×6): qty 2

## 2017-11-26 MED ORDER — WITCH HAZEL-GLYCERIN EX PADS: 1.0000 "application " | MEDICATED_PAD | CUTANEOUS | Status: DC | PRN

## 2017-11-26 MED ORDER — LACTATED RINGERS IV SOLN: 500.0000 mL | INTRAVENOUS | Status: DC | PRN

## 2017-11-26 MED ORDER — DIPHENHYDRAMINE HCL 25 MG PO CAPS: 25.0000 mg | ORAL_CAPSULE | Freq: Four times a day (QID) | ORAL | Status: DC | PRN

## 2017-11-26 MED ORDER — LACTATED RINGERS IV SOLN: 500.0000 mL | Freq: Once | INTRAVENOUS | Status: DC

## 2017-11-26 MED ORDER — SODIUM CHLORIDE 0.9 % IV SOLN: 250.0000 mL | INTRAVENOUS | Status: DC | PRN

## 2017-11-26 MED ORDER — SODIUM CHLORIDE 0.9 % IV SOLN
2.0000 g | Freq: Once | INTRAVENOUS | Status: AC
  Administered 2017-11-26: 2 g via INTRAVENOUS
  Filled 2017-11-26: qty 2000

## 2017-11-26 MED ORDER — SODIUM CHLORIDE 0.9% FLUSH: 3.0000 mL | Freq: Two times a day (BID) | INTRAVENOUS | Status: DC

## 2017-11-26 MED ORDER — IBUPROFEN 600 MG PO TABS
600.0000 mg | ORAL_TABLET | Freq: Four times a day (QID) | ORAL | Status: DC
  Administered 2017-11-26 – 2017-11-28 (×8): 600 mg via ORAL
  Filled 2017-11-26 (×8): qty 1

## 2017-11-26 MED ORDER — OXYCODONE-ACETAMINOPHEN 5-325 MG PO TABS
1.0000 | ORAL_TABLET | ORAL | Status: DC | PRN
  Administered 2017-11-27: 1 via ORAL
  Filled 2017-11-26: qty 1

## 2017-11-26 MED ORDER — BENZOCAINE-MENTHOL 20-0.5 % EX AERO
1.0000 "application " | INHALATION_SPRAY | CUTANEOUS | Status: DC | PRN
  Administered 2017-11-26: 1 via TOPICAL
  Filled 2017-11-26: qty 56

## 2017-11-26 MED ORDER — OXYTOCIN 40 UNITS IN LACTATED RINGERS INFUSION - SIMPLE MED
2.5000 [IU]/h | INTRAVENOUS | Status: DC
Start: 2017-11-26 — End: 2017-11-28

## 2017-11-26 MED ORDER — LACTATED RINGERS IV SOLN: INTRAVENOUS | Status: DC

## 2017-11-26 MED ORDER — LIDOCAINE HCL (PF) 1 % IJ SOLN
INTRAMUSCULAR | Status: AC
  Filled 2017-11-26: qty 30

## 2017-11-26 NOTE — MAU Note (Signed)
Pt woke up this morning with contractions and bloody show

## 2017-11-26 NOTE — Anesthesia Preprocedure Evaluation (Signed)
Anesthesia Evaluation  Patient identified by MRN, date of birth, ID band Patient awake    History of Anesthesia Complications Negative for: history of anesthetic complications  Airway Mallampati: II  TM Distance: >3 FB Neck ROM: Full    Dental no notable dental hx.    Pulmonary neg pulmonary ROS,    Pulmonary exam normal        Cardiovascular negative cardio ROS Normal cardiovascular exam Rhythm:Regular     Neuro/Psych Anxiety negative neurological ROS     GI/Hepatic negative GI ROS, Neg liver ROS,   Endo/Other  negative endocrine ROS  Renal/GU negative Renal ROS  negative genitourinary   Musculoskeletal negative musculoskeletal ROS (+)   Abdominal   Peds negative pediatric ROS (+)  Hematology negative hematology ROS (+)   Anesthesia Other Findings   Reproductive/Obstetrics (+) Pregnancy                             Anesthesia Physical Anesthesia Plan  ASA: II  Anesthesia Plan: Epidural   Post-op Pain Management:    Induction:   PONV Risk Score and Plan:   Airway Management Planned:   Additional Equipment:   Intra-op Plan:   Post-operative Plan:   Informed Consent:   Plan Discussed with:   Anesthesia Plan Comments:         Anesthesia Quick Evaluation

## 2017-11-26 NOTE — Anesthesia Postprocedure Evaluation (Signed)
Anesthesia Post Note  Patient: Darrel Primo  Procedure(s) Performed: AN AD HOC LABOR EPIDURAL     Patient location during evaluation: Mother Baby Anesthesia Type: Epidural Level of consciousness: awake and alert and oriented Pain management: satisfactory to patient Vital Signs Assessment: post-procedure vital signs reviewed and stable Respiratory status: respiratory function stable Cardiovascular status: stable Postop Assessment: no headache, no backache, epidural receding, patient able to bend at knees, no signs of nausea or vomiting and adequate PO intake Anesthetic complications: no    Last Vitals:  Vitals:   11/26/17 1407 11/26/17 1530  BP: 125/72 110/62  Pulse: 72 90  Resp: 18 18  Temp: 36.4 C 36.8 C  SpO2: 99%     Last Pain:  Vitals:   11/26/17 1407  TempSrc: Axillary  PainSc:    Pain Goal:                 Tavita Eastham

## 2017-11-26 NOTE — Anesthesia Pain Management Evaluation Note (Signed)
  [facility] Pain Management Visit Note  Patient: Phyllis Kidd, 22 y.o., female  "Hello I am a member of the anesthesia team at [facility]. We have an anesthesia team available at all times to provide care throughout the hospital, including epidural management and anesthesia for C-section. I don't know your plan for the delivery whether it a natural birth, water birth, IV sedation, nitrous supplementation, doula or epidural, but we want to meet your pain goals."   1.Was your pain managed to your expectations on prior hospitalizations?   Yes   2.What is your expectation for pain management during this hospitalization?     Epidural  3.How can we help you reach that goal? unsure  Record the patient's initial score and the patient's pain goal.   Pain: 0  Pain Goal: 10 [facility] wants you to be able to say your pain was always managed very well.  Phyllis ShellingBURGER,Phyllis Quinney 11/26/2017

## 2017-11-26 NOTE — H&P (Signed)
LABOR AND DELIVERY ADMISSION HISTORY AND PHYSICAL NOTE  Phyllis Kidd is a 22 y.o. female G2P1001 with IUP at 279w2d by 8 week scan presenting for SOL.  She reports positive fetal movement. She denies leakage of fluid or vaginal bleeding.  Prenatal History/Complications: PNC at [facility] Pregnancy complications:  - Limited prenatal care  -History of C/S  - HSV  -Varicella (non-immune)   Past Medical History: Past Medical History:  Diagnosis Date  . Anxiety 2016  . Herpes simplex virus (HSV) infection     Past Surgical History: Past Surgical History:  Procedure Laterality Date  . CESAREAN SECTION  01/18/2016    Obstetrical History: OB History    Gravida  2   Para  1   Term  1   Preterm  0   AB  0   Living  1     SAB  0   TAB  0   Ectopic  0   Multiple      Live Births  1           Social History: Social History   Socioeconomic History  . Marital status: Single    Spouse name: Not on file  . Number of children: Not on file  . Years of education: Not on file  . Highest education level: Not on file  Occupational History  . Not on file  Social Needs  . Financial resource strain: Not on file  . Food insecurity:    Worry: Not on file    Inability: Not on file  . Transportation needs:    Medical: Not on file    Non-medical: Not on file  Tobacco Use  . Smoking status: Never Smoker  . Smokeless tobacco: Never Used  Substance and Sexual Activity  . Alcohol use: No  . Drug use: No  . Sexual activity: Never    Birth control/protection: None  Lifestyle  . Physical activity:    Days per week: Not on file    Minutes per session: Not on file  . Stress: Not on file  Relationships  . Social connections:    Talks on phone: Not on file    Gets together: Not on file    Attends religious service: Not on file    Active member of club or organization: Not on file    Attends meetings of clubs or organizations: Not on file    Relationship status:  Not on file  Other Topics Concern  . Not on file  Social History Narrative   ** Merged History Encounter **        Family History: No family history on file.  Allergies: No Known Allergies  Medications Prior to Admission  Medication Sig Dispense Refill Last Dose  . Prenat-FeCbn-FeAspGl-FA-Omega (OB COMPLETE PETITE) 35-5-1-200 MG CAPS Take 1 tablet by mouth daily. 30 capsule 12 Past Week at Unknown time  . valACYclovir (VALTREX) 500 MG tablet Take two tablets by mouth twice daily for ten days; then one tablet twice daily for remainder of pregnancy 180 tablet 2 11/25/2017 at Unknown time  . Elastic Bandages & Supports (COMFORT FIT MATERNITY SUPP SM) MISC 1 Units daily as needed by Does not apply route. (Patient not taking: Reported on 08/22/2017) 1 each 0 Not Taking  . Prenatal-DSS-FeCb-FeGl-FA (CITRANATAL BLOOM) 90-1 MG TABS Take 1 tablet by mouth daily. (Patient not taking: Reported on 11/25/2017) 30 tablet 12 Not Taking at Unknown time     Review of Systems  All systems reviewed and  negative except as stated in HPI  Physical Exam Blood pressure 130/78, pulse (!) 104, temperature 98.5 F (36.9 C), resp. rate 18, height 5\' 2"  (1.575 m), weight 179 lb (81.2 kg), last menstrual period 03/14/2017, SpO2 99 %, unknown if currently breastfeeding. General appearance: alert, cooperative and no distress Lungs: clear to auscultation bilaterally Heart: regular rate and rhythm Abdomen: soft, non-tender; bowel sounds normal Extremities: No calf swelling or tenderness Presentation: cephalic  Fetal monitoring: 145/ moderate/ +accels/ no decels  Uterine activity: 3-5/ moderate by palpation  Dilation: 8 Station: -1 Exam by:: Traci   Prenatal labs: ABO, Rh: B/Positive/-- (10/24 1641) Antibody: Negative (10/24 1641) Rubella: 1.48 (10/24 1641) RPR: Non Reactive (01/29 1114)  HBsAg: Negative (10/24 1641)  HIV: Non Reactive (01/29 1114)  GC/Chlamydia: Negative (1/31) GBS:   Positive (4/12) 2  hr Glucola: 72-129-97 Genetic screening:  Negative  Anatomy US: normal female   [facility] [place] Prenatal Labs  Dating 8 week scan Blood type: B/Positive/-- (10/24 1641) B pos  Genetic Screen Quad:  Negative NIPT:  Antibody:Negative (10/24 1641)  Anatomic [place] Limited spine at 19w, Arly.Keller ] rescan at 24 weeks; normal female fetus Rubella: 1.48 (10/24 1641)  GTT  Third trimester: WNL RPR: Non Reactive (01/29 1114)   Flu vaccine Declined  HBsAg: Negative (10/24 1641)   TDaP vaccine  Declined                                  HIV: Non Reactive (01/29 1114)   Baby Food Breast                                        GBS: POS  Contraception Depo  Pap:n/a <21 years  Circumcision N/a female fetus   Pediatrician Undecided  CF:: Neg  Support Person Joshua  SMA: Neg  Prenatal Classes no Hgb electrophoresis: normal    Prenatal Transfer Tool  Maternal Diabetes: No Genetic Screening: Normal Maternal Ultrasounds/Referrals: Declined Fetal Ultrasounds or other Referrals:  None Maternal Substance Abuse:  No Significant Maternal Medications:  None Significant Maternal Lab Results: Lab values include: Group B Strep positive  No results found for this or any previous visit (from the past 24 hour(s)).  Patient Active Problem List   Diagnosis Date Noted  . Positive GBS test 11/20/2017  . 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 History of C-section 09/05/2017  . Susceptible to varicella (non-immune), currently pregnant 06/05/2017  . Supervision of other normal pregnancy, antepartum 05/31/2017  . Limited prenatal care 11/26/2015    Assessment: Phyllis Kidd is a 22 y.o. G2P1001 at [redacted]w[redacted]d here for SOL.  #Labor: Expectant management. Possible AROM after GBS prophylaxis  #Pain: Planning epidural  #FWB: Cat I #ID:  GBS Pos  #MOF: Breast #MOC:Depo #Circ:  N/A- Girl   Sharyon Cable, CNM 11/26/2017, 6:45 AM

## 2017-11-26 NOTE — Anesthesia Procedure Notes (Signed)
Epidural Patient location during procedure: OB Start time: 11/26/2017 7:29 AM  Staffing Anesthesiologist: Odette FractionHarkins, Alonza Knisley, MD Performed: anesthesiologist   Preanesthetic Checklist Completed: patient identified, site marked, surgical consent, pre-op evaluation, timeout performed, IV checked, risks and benefits discussed and monitors and equipment checked  Epidural Patient position: sitting Prep: DuraPrep Patient monitoring: heart rate, continuous pulse ox and blood pressure Location: L3-L4 Injection technique: LOR saline and LOR air  Needle:  Needle type: Tuohy  Needle gauge: 17 G Needle length: 9 cm and 9 Needle insertion depth: 8 cm Catheter type: closed end flexible Catheter size: 20 Guage Catheter at skin depth: 15 cm Test dose: negative and 2% lidocaine with Epi 1:200 K  Assessment Events: blood not aspirated, injection not painful, no injection resistance, negative IV test and no paresthesia  Additional Notes Patient identified. Risks/Benefits/Options discussed with patient including but not limited to bleeding, infection, nerve damage, paralysis, failed block, incomplete pain control, headache, blood pressure changes, nausea, vomiting, reactions to medication both or allergic, itching and postpartum back pain. Confirmed with bedside nurse the patient's most recent platelet count. Confirmed with patient that they are not currently taking any anticoagulation, have any bleeding history or any family history of bleeding disorders. Patient expressed understanding and wished to proceed. All questions were answered. Sterile technique was used throughout the entire procedure. Please see nursing notes for vital signs. Test dose was given through epidural needle and negative prior to continuing to dose epidural or start infusion. Warning signs of high block given to the patient including shortness of breath, tingling/numbness in hands, complete motor block, or any concerning symptoms with  instructions to call for help. Patient was given instructions on fall risk and not to get out of bed. All questions and concerns addressed with instructions to call with any issues.

## 2017-11-27 ENCOUNTER — Encounter (HOSPITAL_COMMUNITY): Payer: Self-pay | Admitting: Advanced Practice Midwife

## 2017-11-27 ENCOUNTER — Other Ambulatory Visit: Payer: Medicaid Other

## 2017-11-27 ENCOUNTER — Encounter: Payer: Medicaid Other | Admitting: Certified Nurse Midwife

## 2017-11-27 DIAGNOSIS — Z3A40 40 weeks gestation of pregnancy: Secondary | ICD-10-CM

## 2017-11-27 DIAGNOSIS — O480 Post-term pregnancy: Secondary | ICD-10-CM

## 2017-11-27 DIAGNOSIS — O99824 Streptococcus B carrier state complicating childbirth: Secondary | ICD-10-CM

## 2017-11-27 NOTE — Progress Notes (Signed)

## 2017-11-27 NOTE — Progress Notes (Signed)
Post Partum Day #1 Subjective: no complaints, up ad lib and tolerating PO; bottlefeeding; desires Depo for pp contraception; inadequate ppx for GBS  Objective: Blood pressure 110/70, pulse 62, temperature 98.3 F (36.8 C), temperature source Oral, resp. rate 18, height 5\' 2"  (1.575 m), weight 81.2 kg (179 lb), last menstrual period 03/14/2017, SpO2 99 %, unknown if currently breastfeeding.  Physical Exam:  General: alert, cooperative and no distress Lochia: appropriate Uterine Fundus: firm DVT Evaluation: No evidence of DVT seen on physical exam.  Recent Labs    11/26/17 0645  HGB 12.3  HCT 35.2*    Assessment/Plan: Plan for discharge tomorrow   LOS: 1 day   Korey Arroyo CNM 11/27/2017, 9:05 AM

## 2017-11-28 MED ORDER — IBUPROFEN 600 MG PO TABS: 600.0000 mg | ORAL_TABLET | Freq: Four times a day (QID) | ORAL | 0 refills | Status: DC

## 2017-11-28 NOTE — Discharge Summary (Addendum)
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 yo [id number] at [redacted]w[redacted]d was admitted in Active Labor on 11/26/2017. Patient had an uncomplicated labor course as follows:  Membrane Rupture Time/Date: 5:00 AM ,11/26/2017   Intrapartum Procedures: Episiotomy: None [1]                                         Lacerations:  None [1]  Patient had a delivery of a Viable infant. 11/26/2017  Information for the patient's newborn:  Jariah, [place] Girl Brylee [[id number]]  Delivery Method: Vag-Spont    Pateint had an uncomplicated postpartum course.  She is ambulating, tolerating a regular diet, passing flatus, and urinating well. Patient is discharged home in stable condition on 11/28/17.   Physical exam  Vitals:   11/27/17 0500 11/27/17 0544 11/27/17 1812 11/28/17 0545  BP: 110/61 110/70 (!) 112/57 (!) 99/53  Pulse: 72 62 72 (!) 58  Resp: 18 18 18 18   Temp: 98.3 F (36.8 C)  98.5 F (36.9 C) 98.4 F (36.9 C)  TempSrc: Oral   Oral  SpO2:   100%   Weight:      Height:       General: alert,  cooperative, no distress Lochia: appropriate Uterine Fundus: firm Incision: N/A DVT Evaluation: No evidence of DVT seen on physical exam. Labs: Lab Results  Component Value Date   WBC 10.8 (H) 11/26/2017   HGB 12.3 11/26/2017   HCT 35.2 (L) 11/26/2017   MCV 88.9 11/26/2017   PLT 259 11/26/2017   CMP Latest Ref Rng & Units 09/06/2017  Glucose 65 - 99 mg/dL 81  BUN 6 - 20 mg/dL 5(L)  Creatinine 1.30 - 1.00 mg/dL 8.65  Sodium 784 - 696 mmol/L 136  Potassium 3.5 - 5.1 mmol/L 3.5  Chloride 101 - 111 mmol/L 106  CO2 22 - 32 mmol/L 21(L)  Calcium 8.9 - 10.3 mg/dL 2.9(B)  Total Protein 6.5 - 8.1 g/dL 6.8  Total Bilirubin 0.3 - 1.2 mg/dL 0.4  Alkaline Phos 38 - 126  U/L 44  AST 15 - 41 U/L 14(L)  ALT 14 - 54 U/L 11(L)    Discharge instruction: per After Visit Summary and "Baby and Me Booklet".  Allergies as of 11/28/2017   No Known Allergies     Medication List    STOP taking these medications   CITRANATAL BLOOM 90-1 MG Tabs   COMFORT FIT MATERNITY SUPP SM Misc   valACYclovir 500 MG tablet Commonly known as:  VALTREX     TAKE these medications   ibuprofen 600 MG tablet Commonly known as:  ADVIL,MOTRIN Take 1 tablet (600 mg total) by mouth every 6 (six) hours.   OB COMPLETE PETITE 35-5-1-200 MG Caps Take 1 tablet by mouth daily.       Diet: routine diet  Activity: Advance as tolerated. Pelvic rest for 6 weeks.   Outpatient follow up:4 wekks Follow up Appt:No future appointments. Follow up Visit:No follow-ups on file.  Postpartum contraception: Depo Provera  Newborn Data: Live born female  Birth Weight: 6 lb 11.6 oz (3050 g) APGAR: 8, 9  Newborn Delivery   Birth date/time:  11/26/2017 12:20:00 Delivery type:  VBAC, Spontaneous     Baby Feeding: Bottle Disposition:home with mother   11/28/2017 Gorden HarmsMegan Campbell, MD  CNM attestation I have seen and examined this patient and agree with above documentation in the resident's note.   [name] Audrie LiaFrasier is  a 22 y.o. [id number] s/p VBAC.   Pain is well controlled.  Plan for birth control is Depo-Provera.  Method of Feeding: bottle  PE:  BP (!) 99/53 (BP Location: Left Arm)   Pulse (!) 58   Temp 98.4 F (36.9 C) (Oral)   Resp 18   Ht 5\' 2"  (1.575 m)   Wt 81.2 kg (179 lb)   LMP 03/14/2017   SpO2 100%   Breastfeeding? Unknown   BMI 32.74 kg/m  Fundus firm  Recent Labs    11/26/17 0645  HGB 12.3  HCT 35.2*     Plan: discharge today - postpartum care discussed - f/u clinic in 4 weeks for postpartum visit   Cam HaiSHAW, Deaunna Olarte, CNM 1:41 PM 11/28/2017

## 2017-11-28 NOTE — Progress Notes (Signed)
CSW contacted by RN with concerns for need for resources.  CSW met with MOB who states she is wondering if the hospital could provide her with diapers and wipes to go home.  CSW explained that this is not something the hospital can do and further explored her situation and preparedness for baby.  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  She plans to look for work after a period of time home with her baby.  She states PGF lives in the home with them and provides child care so they can work.  She states FOB works at [employer] and that he supports the family on his income.  She states that they have basic supplies for infant, although states their plan is to sleep with baby in bed with them.  CSW informed MOB that they cannot sleep with the baby in bed with them and informed her of SIDS risks.  CSW set her up on [facility] [employer] and asked her to contact the RN desk once she has her code.  She agreed to change her plan from co-sleeping to utilizing the baby box.  CSW cautioned her to place the box in a safe place where her almost 21 year old cannot climb into it.  She stated understanding. MOB denies any emotional needs or mental health hx.  CSW encouraged a [employer] referral as an agency to help with positive parenting, early literacy, linking with resources, etc.  She states she participated in this program with her son and is interested again.  CSW will make referral.  MOB stated appreciation. CSW discussed PMADs and the importance of monitoring mental and emotional health throughout the postpartum time period with the use of the [place] Postnatal Depression Scale and to notify her doctor if she has concerns at any time.  She agreed and states no concerns after her son was born once they got home from the hospital.  She states she cried when her boyfriend left to go to work while  she was still in the hospital because she didn't like being there alone, but states she has not felt this way after this delivery.  MOB was appreciative of the visit.  CSW identifies no barriers to discharge.  CSW asked that security take a baby bundle pack to MOB's room.

## 2017-11-28 NOTE — Discharge Instructions (Signed)
Postpartum Care After Vaginal Delivery °The period of time right after you deliver your newborn is called the postpartum period. °What kind of medical care will I receive? °· You may continue to receive fluids and medicines through an IV tube inserted into one of your veins. °· If an incision was made near your vagina (episiotomy) or if you had some vaginal tearing during delivery, cold compresses may be placed on your episiotomy or your tear. This helps to reduce pain and swelling. °· You may be given a squirt bottle to use when you go to the bathroom. You may use this until you are comfortable wiping as usual. To use the squirt bottle, follow these steps: °? Before you urinate, fill the squirt bottle with warm water. Do not use hot water. °? After you urinate, while you are sitting on the toilet, use the squirt bottle to rinse the area around your urethra and vaginal opening. This rinses away any urine and blood. °? You may do this instead of wiping. As you start healing, you may use the squirt bottle before wiping yourself. Make sure to wipe gently. °? Fill the squirt bottle with clean water every time you use the bathroom. °· You will be given sanitary pads to wear. °How can I expect to feel? °· You may not feel the need to urinate for several hours after delivery. °· You will have some soreness and pain in your abdomen and vagina. °· If you are breastfeeding, you may have uterine contractions every time you breastfeed for up to several weeks postpartum. Uterine contractions help your uterus return to its normal size. °· It is normal to have vaginal bleeding (lochia) after delivery. The amount and appearance of lochia is often similar to a menstrual period in the first week after delivery. It will gradually decrease over the next few weeks to a dry, yellow-brown discharge. For most women, lochia stops completely by 6-8 weeks after delivery. Vaginal bleeding can vary from woman to woman. °· Within the first few  days after delivery, you may have breast engorgement. This is when your breasts feel heavy, full, and uncomfortable. Your breasts may also throb and feel hard, tightly stretched, warm, and tender. After this occurs, you may have milk leaking from your breasts. Your health care provider can help you relieve discomfort due to breast engorgement. Breast engorgement should go away within a few days. °· You may feel more sad or worried than normal due to hormonal changes after delivery. These feelings should not last more than a few days. If these feelings do not go away after several days, speak with your health care provider. °How should I care for myself? °· Tell your health care provider if you have pain or discomfort. °· Drink enough water to keep your urine clear or pale yellow. °· Wash your hands thoroughly with soap and water for at least 20 seconds after changing your sanitary pads, after using the toilet, and before holding or feeding your baby. °· If you are not breastfeeding, avoid touching your breasts a lot. Doing this can make your breasts produce more milk. °· If you become weak or lightheaded, or you feel like you might faint, ask for help before: °? Getting out of bed. °? Showering. °· Change your sanitary pads frequently. Watch for any changes in your flow, such as a sudden increase in volume, a change in color, the passing of large blood clots. If you pass a blood clot from your vagina, save it   to show to your health care provider. Do not flush blood clots down the toilet without having your health care provider look at them. °· Make sure that all your vaccinations are up to date. This can help protect you and your baby from getting certain diseases. You may need to have immunizations done before you leave the hospital. °· If desired, talk with your health care provider about methods of family planning or birth control (contraception). °How can I start bonding with my baby? °Spending as much time as  possible with your baby is very important. During this time, you and your baby can get to know each other and develop a bond. Having your baby stay with you in your room (rooming in) can give you time to get to know your baby. Rooming in can also help you become comfortable caring for your baby. Breastfeeding can also help you bond with your baby. °How can I plan for returning home with my baby? °· Make sure that you have a car seat installed in your vehicle. °? Your car seat should be checked by a certified car seat installer to make sure that it is installed safely. °? Make sure that your baby fits into the car seat safely. °· Ask your health care provider any questions you have about caring for yourself or your baby. Make sure that you are able to contact your health care provider with any questions after leaving the hospital. °This information is not intended to replace advice given to you by your health care provider. Make sure you discuss any questions you have with your health care provider. °Document Released: 05/22/2007 Document Revised: 12/28/2015 Document Reviewed: 06/29/2015 °Elsevier Interactive Patient Education © 2018 [employer] ° °

## 2017-12-18 ENCOUNTER — Encounter: Payer: Self-pay | Admitting: Certified Nurse Midwife

## 2017-12-18 ENCOUNTER — Ambulatory Visit (INDEPENDENT_AMBULATORY_CARE_PROVIDER_SITE_OTHER): Payer: Medicaid Other | Admitting: Certified Nurse Midwife

## 2017-12-18 VITALS — BP 128/82 | HR 93 | Wt 165.0 lb

## 2017-12-18 DIAGNOSIS — Z1389 Encounter for screening for other disorder: Secondary | ICD-10-CM | POA: Diagnosis not present

## 2017-12-18 DIAGNOSIS — Z3042 Encounter for surveillance of injectable contraceptive: Secondary | ICD-10-CM

## 2017-12-18 DIAGNOSIS — Z30013 Encounter for initial prescription of injectable contraceptive: Secondary | ICD-10-CM

## 2017-12-18 MED ORDER — MEDROXYPROGESTERONE ACETATE 150 MG/ML IM SUSP
150.0000 mg | Freq: Once | INTRAMUSCULAR | Status: AC
  Administered 2017-12-18: 150 mg via INTRAMUSCULAR

## 2017-12-18 MED ORDER — MEDROXYPROGESTERONE ACETATE 150 MG/ML IM SUSP: 150.0000 mg | INTRAMUSCULAR | 4 refills | Status: DC

## 2017-12-18 NOTE — Progress Notes (Signed)
Post Partum Exam  Phyllis Kidd is a 22 y.o. G49P2002 female who presents for a postpartum visit. She is 3 weeks postpartum following a spontaneous vaginal delivery. I have fully reviewed the prenatal and intrapartum course. The delivery was at 40.2 gestational weeks.  Anesthesia: epidural. Postpartum course has been doing well. Baby's course has been doing well. Baby is feeding by bottle - [employer] and [employer]. Bleeding no bleeding. Bowel function is normal. Bladder function is normal. Patient is sexually active. Contraception method is none.  Postpartum depression screening:neg, score 0.  The following portions of the patient's history were reviewed and updated as appropriate: allergies, current medications, past family history, past medical history, past social history, past surgical history and problem list. Last pap smear done n/a <21 years   Review of Systems Pertinent items noted in HPI and remainder of comprehensive ROS otherwise negative.    Objective:  Last menstrual period 03/14/2017, unknown if currently breastfeeding.  General:  alert, cooperative and no distress   Breasts:  inspection negative, no nipple discharge or bleeding, no masses or nodularity palpable  Lungs: clear to auscultation bilaterally  Heart:  regular rate and rhythm, S1, S2 normal, no murmur, click, rub or gallop  Abdomen: soft, non-tender; bowel sounds normal; no masses,  no organomegaly  Pelvic/Rectal Exam: Not performed.        Assessment:    Normal 4 week postpartum exam. Pap smear not done at today's visit.   Plan:   1. Contraception: abstinence 2. Depo provera given today.  B/P rechecked: normotensive.  3. Follow up in: 3 months  or as needed.

## 2018-03-12 ENCOUNTER — Ambulatory Visit: Payer: Medicaid Other

## 2018-03-13 ENCOUNTER — Ambulatory Visit: Payer: Medicaid Other

## 2018-03-14 ENCOUNTER — Telehealth: Payer: Self-pay | Admitting: *Deleted

## 2018-03-14 NOTE — Telephone Encounter (Signed)
Spoke with patient about missed DEPO appt patient stated sister  is a Engineer, civil (consulting)nurse and administered the shot to her on 03/13/2018. Patient did schedule appointment with us for next depo.

## 2018-05-09 ENCOUNTER — Ambulatory Visit: Payer: Medicaid Other | Admitting: Certified Nurse Midwife

## 2018-05-11 ENCOUNTER — Ambulatory Visit (HOSPITAL_COMMUNITY)
Admission: EM | Admit: 2018-05-11 | Discharge: 2018-05-11 | Disposition: A | Discharge status: Home / Self Care | Payer: Medicaid Other | Attending: Family Medicine | Admitting: Family Medicine

## 2018-05-11 ENCOUNTER — Encounter (HOSPITAL_COMMUNITY): Payer: Self-pay

## 2018-05-11 DIAGNOSIS — R1084 Generalized abdominal pain: Secondary | ICD-10-CM | POA: Insufficient documentation

## 2018-05-11 DIAGNOSIS — F419 Anxiety disorder, unspecified: Secondary | ICD-10-CM | POA: Insufficient documentation

## 2018-05-11 DIAGNOSIS — R109 Unspecified abdominal pain: Secondary | ICD-10-CM | POA: Diagnosis present

## 2018-05-11 LAB — COMPREHENSIVE METABOLIC PANEL
ALBUMIN: 4.4 g/dL (ref 3.5–5.0)
ALK PHOS: 48 U/L (ref 38–126)
ALT: 12 U/L (ref 0–44)
AST: 18 U/L (ref 15–41)
Anion gap: 8 (ref 5–15)
BILIRUBIN TOTAL: 0.6 mg/dL (ref 0.3–1.2)
BUN: 8 mg/dL (ref 6–20)
CO2: 24 mmol/L (ref 22–32)
Calcium: 9.4 mg/dL (ref 8.9–10.3)
Chloride: 109 mmol/L (ref 98–111)
Creatinine, Ser: 0.98 mg/dL (ref 0.44–1.00)
GFR calc Af Amer: >60 mL/min (ref >60)
GFR calc non Af Amer: >60 mL/min (ref >60)
GLUCOSE: 93 mg/dL (ref 70–99)
Potassium: 3.6 mmol/L (ref 3.5–5.1)
Sodium: 141 mmol/L (ref 135–145)
Total Protein: 7.8 g/dL (ref 6.5–8.1)

## 2018-05-11 LAB — CBC
HEMATOCRIT: 38.7 % (ref 36.0–46.0)
HEMOGLOBIN: 12.6 g/dL (ref 12.0–15.0)
MCH: 28.4 pg (ref 26.0–34.0)
MCHC: 32.6 g/dL (ref 30.0–36.0)
MCV: 87.2 fL (ref 78.0–100.0)
Platelets: 378 10*3/uL (ref 150–400)
RBC: 4.44 MIL/uL (ref 3.87–5.11)
RDW: 13.1 % (ref 11.5–15.5)
WBC: 6.9 10*3/uL (ref 4.0–10.5)

## 2018-05-11 LAB — POCT URINALYSIS DIP (DEVICE)
BILIRUBIN URINE: NEGATIVE
Glucose, UA: NEGATIVE mg/dL
HGB URINE DIPSTICK: NEGATIVE
Leukocytes, UA: NEGATIVE
NITRITE: NEGATIVE
Protein, ur: NEGATIVE mg/dL
Specific Gravity, Urine: >=1.03 (ref 1.005–1.030)
Urobilinogen, UA: 1 mg/dL (ref 0.0–1.0)
pH: 5.5 (ref 5.0–8.0)

## 2018-05-11 LAB — POCT PREGNANCY, URINE: Preg Test, Ur: NEGATIVE

## 2018-05-11 LAB — LIPASE, BLOOD: Lipase: 47 U/L (ref 11–51)

## 2018-05-11 NOTE — ED Triage Notes (Signed)
Pt presents with cramping in stomach in her stomach and radiates into both sides x 2 months. Denies any other symptoms.

## 2018-05-11 NOTE — ED Provider Notes (Signed)
[facility]    CSN: [id number] Arrival date & time: 05/11/18  1453     History   Chief Complaint Chief Complaint  Patient presents with  . Abdominal Pain    HPI Phyllis Kidd is a 22 y.o. female history of anxiety, HSV presenting today for evaluation of abdominal pain.  Patient states that she has had abdominal cramping for the past 2-1/2 months.  Symptoms occasionally in her upper abdomen, and occasionally on both sides.  She has not had many associated symptoms.  Pain is described as a sharp pain that comes and goes.  She is not having the pain at the time of visit.  She states that she occasionally will have nausea after eating, but pain is not always correlated with eating.  Will also happen when she is in bed.  She has felt as if she has had the urge to urinate, but only urinates slightly.  Believes that her symptoms may be a UTI.  Patient does not have a regular LMP as she is using Depo-Provera for birth control.  Bowel movements have been normal, denies any straining.  Has approximately 2 bowel movements a day.  Denies diarrhea.  Denies pelvic pain, vaginal discharge, abnormal vaginal bleeding.  Denies concern for STDs.  Denies chest pain or shortness of breath.  HPI  Past Medical History:  Diagnosis Date  . Anxiety 2016  . Herpes simplex virus (HSV) infection   . VBAC, delivered     Patient Active Problem List   Diagnosis Date Noted  . 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 CESAREAN SECTION  01/18/2016    OB History    Gravida  2   Para  2   Term  2   Preterm  0   AB  0   Living  2     SAB  0   TAB  0   Ectopic  0   Multiple  0   Live Births  2            Home Medications    Prior to Admission medications   Medication Sig Start Date End Date Taking? Authorizing Provider  ibuprofen (ADVIL,MOTRIN) 600 MG tablet Take 1 tablet (600 mg total) by mouth  every 6 (six) hours. 11/28/17   Lise Auer, MD  medroxyPROGESTERone (DEPO-PROVERA) 150 MG/ML injection Inject 1 mL (150 mg total) into the muscle every 3 (three) months. 02/16/18   Denney, Rachelle A, CNM  Prenat-FeCbn-FeAspGl-FA-Omega (OB COMPLETE PETITE) 35-5-1-200 MG CAPS Take 1 tablet by mouth daily. 11/20/17   Roe Coombs, CNM    Family History No family history on file.  Social History Social History   Tobacco Use  . Smoking status: Never Smoker  . Smokeless tobacco: Never Used  Substance Use Topics  . Alcohol use: No  . Drug use: No     Allergies   Patient has no known allergies.   Review of Systems Review of Systems  Constitutional: Negative for activity change, appetite change, chills, fatigue and fever.  HENT: Negative for congestion, ear pain, rhinorrhea, sinus pressure, sore throat and trouble swallowing.   Eyes: Negative for discharge and redness.  Respiratory: Negative for cough, chest tightness and shortness of breath.   Cardiovascular: Negative for chest pain.  Gastrointestinal: Positive for abdominal pain and nausea. Negative for diarrhea and vomiting.  Genitourinary: Negative for dysuria, flank pain, genital sores, hematuria, menstrual  problem, vaginal bleeding, vaginal discharge and vaginal pain.  Musculoskeletal: Negative for back pain and myalgias.  Skin: Negative for rash.  Neurological: Negative for dizziness, light-headedness and headaches.     Physical Exam Triage Vital Signs ED Triage Vitals [05/11/18 1515]  Enc Vitals Group     BP      Pulse      Resp      Temp      Temp src      SpO2      Weight      Height      Head Circumference      Peak Flow      Pain Score 7     Pain Loc      Pain Edu?      Excl. in GC?    No data found.  Updated Vital Signs BP 135/80   Pulse 68   Temp 98 F (36.7 C)   Resp 18   SpO2 100%   Visual Acuity Right Eye Distance:   Left Eye Distance:   Bilateral Distance:    Right Eye  Near:   Left Eye Near:    Bilateral Near:     Physical Exam  Constitutional: She appears well-developed and well-nourished. No distress.  HENT:  Head: Normocephalic and atraumatic.  Oral mucosa pink and moist, no tonsillar enlargement or exudate. Posterior pharynx patent and nonerythematous, no uvula deviation or swelling. Normal phonation.   Eyes: Conjunctivae are normal.  Neck: Normal range of motion. Neck supple.  Cardiovascular: Normal rate and regular rhythm.  No murmur heard. Pulmonary/Chest: Effort normal and breath sounds normal. No respiratory distress.  Breathing comfortably at rest, CTABL, no wheezing, rales or other adventitious sounds auscultated  Abdominal: Soft. There is no tenderness.  Mild tenderness to palpation in epigastric area and right flank, nontender throughout 4 quadrants of abdomen, negative Murphy's, negative rebound, negative McBurney's  Genitourinary:  Genitourinary Comments: Deferred  Musculoskeletal: She exhibits no edema.  Neurological: She is alert.  Skin: Skin is warm and dry.  Psychiatric: She has a normal mood and affect.  Nursing note and vitals reviewed.    UC Treatments / Results  Labs (all labs ordered are listed, but only abnormal results are displayed) Labs Reviewed  POCT URINALYSIS DIP (DEVICE) - Abnormal; Notable for the following components:      Result Value   Ketones, ur TRACE (*)    All other components within normal limits  CBC  COMPREHENSIVE METABOLIC PANEL  LIPASE, BLOOD  POCT PREGNANCY, URINE  CERVICOVAGINAL ANCILLARY ONLY    EKG None  Radiology No results found.  Procedures Procedures (including critical care time)  Medications Ordered in UC Medications - No data to display  Initial Impression / Assessment and Plan / UC Course  I have reviewed the triage vital signs and the nursing notes.  Pertinent labs & imaging results that were available during my care of the patient were reviewed by me and considered  in my medical decision making (see chart for details).     Patient with 2.5 months of abdominal cramping.  UA unremarkable.  Discussed evaluating patient for STDs, patient agreed to, vaginal swab obtained.  Blood work also drawn to check liver, gallbladder and pancreas as causes.  Discussed with patient doing a trial of reflux medicines for 2 weeks if she is noticing her symptoms more so into her upper abdomen and after meals.  Will call patient with any abnormal results and provide further recommendations.  Discussed  strict return precautions. Patient verbalized understanding and is agreeable with plan.  Final Clinical Impressions(s) / UC Diagnoses   Final diagnoses:  Generalized abdominal pain     Discharge Instructions     Your urine did not show signs of infection today. We drew some blood work to check your liver, gallbladder, pancreas and general signs of infection, we will call you if any of these come back abnormal We will also send the swab off to check for pelvic causes including STDs as a cause of your abdominal cramping.  If you start to notice the pain to be more in your upper abdomen and after eating you may begin taking either omeprazole 20 mg daily or Zantac 150 mg twice daily, both of these may be found over-the-counter  Please continue to monitor your symptoms, follow-up if developing fever, worsening pain, vomiting, diarrhea, weakness or lightheadedness    ED Prescriptions    None     Controlled Substance Prescriptions [place] Controlled Substance Registry consulted? Not Applicable   Lew Dawes, [place] 05/11/18 1714

## 2018-05-11 NOTE — Discharge Instructions (Signed)
Your urine did not show signs of infection today. We drew some blood work to check your liver, gallbladder, pancreas and general signs of infection, we will call you if any of these come back abnormal We will also send the swab off to check for pelvic causes including STDs as a cause of your abdominal cramping.  If you start to notice the pain to be more in your upper abdomen and after eating you may begin taking either omeprazole 20 mg daily or Zantac 150 mg twice daily, both of these may be found over-the-counter  Please continue to monitor your symptoms, follow-up if developing fever, worsening pain, vomiting, diarrhea, weakness or lightheadedness

## 2018-05-14 LAB — CERVICOVAGINAL ANCILLARY ONLY
BACTERIAL VAGINITIS: POSITIVE — AB
Candida vaginitis: NEGATIVE
Chlamydia: NEGATIVE
NEISSERIA GONORRHEA: NEGATIVE
TRICH (WINDOWPATH): NEGATIVE

## 2018-05-17 ENCOUNTER — Telehealth (HOSPITAL_COMMUNITY): Payer: Self-pay

## 2018-05-17 MED ORDER — METRONIDAZOLE 500 MG PO TABS: 500.0000 mg | ORAL_TABLET | Freq: Two times a day (BID) | ORAL | 0 refills | Status: DC

## 2018-05-17 NOTE — Telephone Encounter (Signed)
Bacterial vaginosis is positive. This was not treated at the urgent care visit.  Flagyl 500 mg BID x 7 days #14 no refills sent to patients pharmacy of choice.  Attempted to reach patient. No answer at this time. 

## 2018-06-05 ENCOUNTER — Ambulatory Visit: Payer: Medicaid Other

## 2018-06-12 ENCOUNTER — Encounter: Payer: Self-pay | Admitting: Obstetrics

## 2018-08-04 ENCOUNTER — Emergency Department (HOSPITAL_COMMUNITY)
Admission: EM | Admit: 2018-08-04 | Discharge: 2018-08-04 | Disposition: A | Discharge status: Home / Self Care | Payer: Self-pay | Attending: Emergency Medicine | Admitting: Emergency Medicine

## 2018-08-04 ENCOUNTER — Emergency Department (HOSPITAL_COMMUNITY): Payer: Self-pay

## 2018-08-04 ENCOUNTER — Encounter (HOSPITAL_COMMUNITY): Payer: Self-pay

## 2018-08-04 DIAGNOSIS — L03211 Cellulitis of face: Secondary | ICD-10-CM | POA: Insufficient documentation

## 2018-08-04 DIAGNOSIS — K0381 Cracked tooth: Secondary | ICD-10-CM | POA: Insufficient documentation

## 2018-08-04 DIAGNOSIS — Z79899 Other long term (current) drug therapy: Secondary | ICD-10-CM | POA: Insufficient documentation

## 2018-08-04 LAB — I-STAT CHEM 8, ED
BUN: 10 mg/dL (ref 6–20)
CREATININE: 0.8 mg/dL (ref 0.44–1.00)
Calcium, Ion: 1.14 mmol/L — ABNORMAL LOW (ref 1.15–1.40)
Chloride: 101 mmol/L (ref 98–111)
Glucose, Bld: 87 mg/dL (ref 70–99)
HCT: 45 % (ref 36.0–46.0)
Hemoglobin: 15.3 g/dL — ABNORMAL HIGH (ref 12.0–15.0)
Potassium: 4.2 mmol/L (ref 3.5–5.1)
Sodium: 137 mmol/L (ref 135–145)
TCO2: 28 mmol/L (ref 22–32)

## 2018-08-04 LAB — CBC WITH DIFFERENTIAL/PLATELET
Abs Immature Granulocytes: 0.04 10*3/uL (ref 0.00–0.07)
Basophils Absolute: 0 10*3/uL (ref 0.0–0.1)
Basophils Relative: 1 %
EOS PCT: 0 %
Eosinophils Absolute: 0 10*3/uL (ref 0.0–0.5)
HCT: 44 % (ref 36.0–46.0)
Hemoglobin: 14.4 g/dL (ref 12.0–15.0)
Immature Granulocytes: 1 %
Lymphocytes Relative: 23 %
Lymphs Abs: 2 10*3/uL (ref 0.7–4.0)
MCH: 29 pg (ref 26.0–34.0)
MCHC: 32.7 g/dL (ref 30.0–36.0)
MCV: 88.5 fL (ref 80.0–100.0)
Monocytes Absolute: 0.8 10*3/uL (ref 0.1–1.0)
Monocytes Relative: 10 %
Neutro Abs: 5.7 10*3/uL (ref 1.7–7.7)
Neutrophils Relative %: 65 %
Platelets: 395 10*3/uL (ref 150–400)
RBC: 4.97 MIL/uL (ref 3.87–5.11)
RDW: 12.9 % (ref 11.5–15.5)
WBC: 8.6 10*3/uL (ref 4.0–10.5)
nRBC: 0 % (ref 0.0–0.2)

## 2018-08-04 LAB — I-STAT BETA HCG BLOOD, ED (MC, WL, AP ONLY): I-stat hCG, quantitative: <5 m[IU]/mL (ref <5)

## 2018-08-04 MED ORDER — SODIUM CHLORIDE (PF) 0.9 % IJ SOLN
INTRAMUSCULAR | Status: AC
  Filled 2018-08-04: qty 50

## 2018-08-04 MED ORDER — CLINDAMYCIN PHOSPHATE 600 MG/50ML IV SOLN
600.0000 mg | Freq: Once | INTRAVENOUS | Status: AC
  Administered 2018-08-04: 600 mg via INTRAVENOUS
  Filled 2018-08-04: qty 50

## 2018-08-04 MED ORDER — KETOROLAC TROMETHAMINE 30 MG/ML IJ SOLN
30.0000 mg | Freq: Once | INTRAMUSCULAR | Status: AC
  Administered 2018-08-04: 30 mg via INTRAVENOUS
  Filled 2018-08-04: qty 1

## 2018-08-04 MED ORDER — IOPAMIDOL (ISOVUE-300) INJECTION 61%
INTRAVENOUS | Status: AC
  Administered 2018-08-04: 75 mL
  Filled 2018-08-04: qty 100

## 2018-08-04 MED ORDER — CLINDAMYCIN HCL 150 MG PO CAPS: 450.0000 mg | ORAL_CAPSULE | Freq: Three times a day (TID) | ORAL | 0 refills | Status: AC

## 2018-08-04 NOTE — ED Provider Notes (Signed)
[facility] Provider Note   CSN: [id number] Arrival date & time: 08/04/18  1325     History   Chief Complaint No chief complaint on file.   HPI Phyllis Kidd is a 22 y.o. female past medical history of anxiety, HSV who presents for evaluation of 4 days of left-sided facial pain and swelling.  She reports that over the last 4 days, the swelling is gotten worse.  She states that she had not noticed any overlying redness but has noted some warmth.  She reports difficulty opening her mouth secondary to pain.  She states that over the last day, the swelling is started to extend down from her cheek to just right at the chin.  She reports she is still able to swallow her secretions but has not been able to eat or drink in the last 2 days secondary to pain in left side of her face.  She reports that if she tries to open her mouth into, she has pain in her left cheek.  Patient reports she does not take any medication for the pain.  She has not had any fevers.  She has not had any tongue or lip swelling.  The history is provided by the patient.    Past Medical History:  Diagnosis Date  . Anxiety 2016  . Herpes simplex virus (HSV) infection   . VBAC, delivered     Patient Active Problem List   Diagnosis Date Noted  . 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 CESAREAN SECTION  01/18/2016     OB History    Gravida  2   Para  2   Term  2   Preterm  0   AB  0   Living  2     SAB  0   TAB  0   Ectopic  0   Multiple  0   Live Births  2            Home Medications    Prior to Admission medications   Medication Sig Start Date End Date Taking? Authorizing Provider  medroxyPROGESTERone (DEPO-PROVERA) 150 MG/ML injection Inject 1 mL (150 mg total) into the muscle every 3 (three) months. 02/16/18  Yes Denney, Rachelle A, CNM  clindamycin (CLEOCIN) 150 MG  capsule Take 3 capsules (450 mg total) by mouth 3 (three) times daily for 7 days. 08/04/18 08/11/18  Maxwell CaulLayden, Chen Holzman A, PA-C  ibuprofen (ADVIL,MOTRIN) 600 MG tablet Take 1 tablet (600 mg total) by mouth every 6 (six) hours. Patient not taking: Reported on 08/04/2018 11/28/17   Lise Auerampbell, Megan C, MD  metroNIDAZOLE (FLAGYL) 500 MG tablet Take 1 tablet (500 mg total) by mouth 2 (two) times daily. Patient not taking: Reported on 08/04/2018 05/17/18   Eustace MooreNelson, Yvonne Sue, MD  Prenat-FeCbn-FeAspGl-FA-Omega (OB COMPLETE PETITE) 35-5-1-200 MG CAPS Take 1 tablet by mouth daily. Patient not taking: Reported on 08/04/2018 11/20/17   Roe Coombsenney, Rachelle A, CNM    Family History No family history on file.  Social History Social History   Tobacco Use  . Smoking status: Never Smoker  . Smokeless tobacco: Never Used  Substance Use Topics  . Alcohol use: No  . Drug use: No     Allergies   Patient has no known allergies.   Review of Systems Review of Systems  Constitutional: Negative for fever.  HENT: Positive for dental problem, facial swelling and trouble swallowing.  Negative for drooling.   Respiratory: Negative for shortness of breath.   Gastrointestinal: Negative for nausea and vomiting.  Neurological: Negative for headaches.  All other systems reviewed and are negative.    Physical Exam Updated Vital Signs BP 107/84   Pulse 92   Temp 98.8 F (37.1 C) (Oral)   Resp 18   SpO2 100%   Physical Exam Vitals signs and nursing note reviewed.  Constitutional:      Appearance: She is well-developed.  HENT:     Head: Normocephalic and atraumatic.      Comments: Diffuse soft tissue swelling noted to left lower face that spreads distally towards the chin but does not cross over to the chin.  No overlying warmth or erythema.    Mouth/Throat:     Mouth: Mucous membranes are moist.     Dentition: Abnormal dentition.     Pharynx: Oropharynx is clear. Uvula midline.      Comments: Airways  patent, phonation is intact.  Patient only able to open her mouth to about 1-2 finger widths.  Tooth #19 is partially cracked.  There is some fluctuance noted to the posterior aspect of the left lower gum.  No swelling noted to the floor of mouth.  No swelling noted to the lips or tongue. Eyes:     General: No scleral icterus.       Right eye: No discharge.        Left eye: No discharge.     Conjunctiva/sclera: Conjunctivae normal.  Neck:     Musculoskeletal: Full passive range of motion without pain. No edema.  Pulmonary:     Effort: Pulmonary effort is normal.  Skin:    General: Skin is warm and dry.  Neurological:     Mental Status: She is alert.  Psychiatric:        Speech: Speech normal.        Behavior: Behavior normal.      ED Treatments / Results  Labs (all labs ordered are listed, but only abnormal results are displayed) Labs Reviewed  I-STAT CHEM 8, ED - Abnormal; Notable for the following components:      Result Value   Calcium, Ion 1.14 (*)    Hemoglobin 15.3 (*)    All other components within normal limits  CBC WITH DIFFERENTIAL/PLATELET  I-STAT BETA HCG BLOOD, ED ([facility], [facility], [facility] ONLY)    EKG None  Radiology Ct Maxillofacial W Contrast  Result Date: 08/04/2018 CLINICAL DATA:  Left-sided jaw swelling beginning 4 days ago. Left ear pain. Difficulty breathing. Broken tooth. EXAM: CT MAXILLOFACIAL WITH CONTRAST TECHNIQUE: Multidetector CT imaging of the maxillofacial structures was performed with intravenous contrast. Multiplanar CT image reconstructions were also generated. CONTRAST:  75mL ISOVUE-300 IOPAMIDOL (ISOVUE-300) INJECTION 61% COMPARISON:  None. FINDINGS: Osseous: No acute fracture or mandibular dislocation. Multiple dental caries including prominent involvement of the left mandibular first molar tooth with associated mild periapical lucency. Orbits: Unremarkable. Sinuses: Mild scattered paranasal sinus mucosal thickening without fluid. Clear mastoid air  cells. Soft tissues: Inflammatory stranding and soft tissue thickening involving the left buccal space and overlying subcutaneous tissues. No drainable fluid collection identified. Small upper cervical lymph nodes more notable on the left, likely reactive. Limited intracranial: Unremarkable. IMPRESSION: Inflammation involving the left buccal and subcutaneous facial soft tissues compatible with cellulitis and possibly of dental origin. No evidence of abscess. Electronically Signed   By: Sebastian AcheAllen  Grady M.D.   On: 08/04/2018 18:21    Procedures Procedures (including critical care  time)  Medications Ordered in ED Medications  ketorolac (TORADOL) 30 MG/ML injection 30 mg (30 mg Intravenous Given 08/04/18 1709)  iopamidol (ISOVUE-300) 61 % injection (75 mLs  Contrast Given 08/04/18 1752)  clindamycin (CLEOCIN) IVPB 600 mg (0 mg Intravenous Stopped 08/04/18 2019)     Initial Impression / Assessment and Plan / ED Course  I have reviewed the triage vital signs and the nursing notes.  Pertinent labs & imaging results that were available during my care of the patient were reviewed by me and considered in my medical decision making (see chart for details).     22 year old female who presents for evaluation of left-sided facial swelling that began 4 days ago.  No fevers.  Difficulty eating secondary to pain.  Patient is afebrile, he appears uncomfortable but no acute distress.  Vital signs stable.  On exam, she has left-sided facial swelling that begins in the left lower part and extends over to the chin.  No overlying warmth, erythema.  On exam, she has partially cracked tooth #19 with no surrounding gingival edema.  There is some fluctuance noted.  No swelling of floor of mouth.  Exam not concerning for peritonsillar abscess.  Consider dental abscess.  At this time, do not suspect Ludwig angina as a cause of patient's symptoms but given swelling, difficulty opening mouth, will plan for imaging.  I-STAT  Chem-8 unremarkable.  I-STAT beta negative.  CBC without any significant leukocytosis or anemia.  CT scan shows inflammation involving the left pupil and subcutaneous soft tissues compatible with cellulitis.  No obvious dental abscess identified.  Patient given a dose of IV clindamycin here in the ED.  Reevaluation after antibiotics.  Patient reports some improvement in pain and symptoms.  She is able to open her mouth significantly more so than she was initially.  She can now open her mouth about 3 fingers width.  Reevaluation shows no swelling of tongue or lips.  No swelling noted to floor of mouth.   She has been able to tolerate secretions and p.o. here in the department without any difficulty.  Vital signs are stable.  Reevaluation shows no swelling of floor of mouth, tongue, lips.  No swelling in neck.  Reevaluation after improvement in trismus shows posterior oropharynx is clear.  Patient states she feels comfortable going home. At this time, patient exhibits no emergent life-threatening condition that require further evaluation in ED or admission.  I discussed with patient that while at this time her exam is not concerning for Ludwig's angina, she should closely monitor symptoms and return emergency department immediately if she has any worsening or concerning symptoms. Patient had ample opportunity for questions and discussion. All patient's questions were answered with full understanding. Strict return precautions discussed. Patient expresses understanding and agreement to plan.   Final Clinical Impressions(s) / ED Diagnoses   Final diagnoses:  Facial cellulitis    ED Discharge Orders         Ordered    clindamycin (CLEOCIN) 150 MG capsule  3 times daily     08/04/18 2009           Rosana Hoes 08/04/18 2323    Little, Ambrose Finland, MD 08/05/18 2358

## 2018-08-04 NOTE — Discharge Instructions (Signed)
You can take Tylenol or Ibuprofen as directed for pain. You can alternate Tylenol and Ibuprofen every 4 hours. If you take Tylenol at 1pm, then you can take Ibuprofen at 5pm. Then you can take Tylenol again at 9pm.   Take antibiotics as directed. Please take all of your antibiotics until finished.  [facility] to establish a primary care doctor if you do not have one.   Return emergency department for any worsening swelling, difficulty swallowing or secretions, difficulty eating or drinking, fevers, swelling of your lips or tongue or any other worsening concerning symptoms.

## 2018-08-04 NOTE — ED Triage Notes (Signed)
Patient here with c/o tooth ache pain and left side of her face swelling.

## 2018-09-25 ENCOUNTER — Emergency Department (HOSPITAL_COMMUNITY)
Admission: EM | Admit: 2018-09-25 | Discharge: 2018-09-25 | Disposition: A | Discharge status: Home / Self Care | Payer: Self-pay | Attending: Emergency Medicine | Admitting: Emergency Medicine

## 2018-09-25 ENCOUNTER — Encounter (HOSPITAL_COMMUNITY): Payer: Self-pay | Admitting: Internal Medicine

## 2018-09-25 ENCOUNTER — Emergency Department (HOSPITAL_BASED_OUTPATIENT_CLINIC_OR_DEPARTMENT_OTHER): Payer: Self-pay

## 2018-09-25 ENCOUNTER — Emergency Department (HOSPITAL_COMMUNITY): Payer: Self-pay

## 2018-09-25 DIAGNOSIS — M79609 Pain in unspecified limb: Secondary | ICD-10-CM

## 2018-09-25 DIAGNOSIS — R609 Edema, unspecified: Secondary | ICD-10-CM

## 2018-09-25 DIAGNOSIS — M79642 Pain in left hand: Secondary | ICD-10-CM

## 2018-09-25 DIAGNOSIS — M79662 Pain in left lower leg: Secondary | ICD-10-CM | POA: Insufficient documentation

## 2018-09-25 DIAGNOSIS — M79605 Pain in left leg: Secondary | ICD-10-CM

## 2018-09-25 DIAGNOSIS — Z79899 Other long term (current) drug therapy: Secondary | ICD-10-CM | POA: Insufficient documentation

## 2018-09-25 DIAGNOSIS — M7989 Other specified soft tissue disorders: Secondary | ICD-10-CM

## 2018-09-25 NOTE — Progress Notes (Signed)
Left lower extremity venous duplex exam completed. Please see preliminary notes on CV PROC under chart review. Reyce Lubeck H Vernel Donlan(RDMS RVT) 09/25/18 2:08 PM

## 2018-09-25 NOTE — ED Provider Notes (Signed)
[facility] EMERGENCY DEPARTMENT Provider Note   CSN: [id number] Arrival date & time: 09/25/18  1246    History   Chief Complaint Chief Complaint  Patient presents with  . Leg Pain    HPI Phyllis Kidd is a 23 y.o. female.     HPI   23 year old female presents today with several complaints.  Patient notes 1 month history of left lower leg pain.  She notes intermittent swelling, none presently.  She notes the pain is along the anterior aspect and posterior calf.  Denies any history DVT or PE, denies any chest pain or shortness of breath, no significant risk factors for the same, she is a non-smoker not on birth control with no recent trauma or surgery.  Patient also notes yesterday she was wrestling around and jammed her left third and fourth fingers, she notes pain along the proximal phalanx, no obvious deformities no medications prior to arrival.  Past Medical History:  Diagnosis Date  . Anxiety 2016  . Herpes simplex virus (HSV) infection   . VBAC, delivered     Patient Active Problem List   Diagnosis Date Noted  . 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 CESAREAN SECTION  01/18/2016     OB History    Gravida  2   Para  2   Term  2   Preterm  0   AB  0   Living  2     SAB  0   TAB  0   Ectopic  0   Multiple  0   Live Births  2            Home Medications    Prior to Admission medications   Medication Sig Start Date End Date Taking? Authorizing Provider  ibuprofen (ADVIL,MOTRIN) 600 MG tablet Take 1 tablet (600 mg total) by mouth every 6 (six) hours. Patient not taking: Reported on 08/04/2018 11/28/17   Lise Auer, MD  medroxyPROGESTERone (DEPO-PROVERA) 150 MG/ML injection Inject 1 mL (150 mg total) into the muscle every 3 (three) months. 02/16/18   Orvilla Cornwall A, CNM  metroNIDAZOLE (FLAGYL) 500 MG tablet Take 1 tablet (500 mg total)  by mouth 2 (two) times daily. Patient not taking: Reported on 08/04/2018 05/17/18   Eustace Moore, MD  Prenat-FeCbn-FeAspGl-FA-Omega (OB COMPLETE PETITE) 35-5-1-200 MG CAPS Take 1 tablet by mouth daily. Patient not taking: Reported on 08/04/2018 11/20/17   Roe Coombs, CNM    Family History No family history on file.  Social History Social History   Tobacco Use  . Smoking status: Never Smoker  . Smokeless tobacco: Never Used  Substance Use Topics  . Alcohol use: No  . Drug use: No     Allergies   Patient has no known allergies.   Review of Systems Review of Systems  All other systems reviewed and are negative.   Physical Exam Updated Vital Signs BP 125/79   Pulse 67   Temp 98.5 F (36.9 C) (Oral)   Resp 16   LMP  (LMP Unknown)   SpO2 100%   Physical Exam Vitals signs and nursing note reviewed.  Constitutional:      Appearance: She is well-developed.  HENT:     Head: Normocephalic and atraumatic.  Eyes:     General: No scleral icterus.       Right eye: No discharge.  Left eye: No discharge.     Conjunctiva/sclera: Conjunctivae normal.     Pupils: Pupils are equal, round, and reactive to light.  Neck:     Musculoskeletal: Normal range of motion.     Vascular: No JVD.     Trachea: No tracheal deviation.  Pulmonary:     Effort: Pulmonary effort is normal.     Breath sounds: No stridor.  Musculoskeletal:     Comments: Minor bruising the left anterior shin, joint compartments soft, tenderness to the calf, no edema, pedal pulse 2+ distal sensation intact  Hand symmetrical bilateral no swelling or edema, tenderness palpation of the proximal phalanx of the third and fourth fingers left hand, full range of motion at the joints  Neurological:     Mental Status: She is alert and oriented to person, place, and time.     Coordination: Coordination normal.  Psychiatric:        Behavior: Behavior normal.        Thought Content: Thought content  normal.        Judgment: Judgment normal.      ED Treatments / Results  Labs (all labs ordered are listed, but only abnormal results are displayed) Labs Reviewed - No data to display  EKG None  Radiology Dg Hand Complete Left  Result Date: 09/25/2018 CLINICAL DATA:  Pain after trauma EXAM: LEFT HAND - COMPLETE 3+ VIEW COMPARISON:  05/07/2013 FINDINGS: There is no evidence of fracture or dislocation. There is no evidence of arthropathy or other focal bone abnormality. Soft tissues are unremarkable. IMPRESSION: Negative. Electronically Signed   By: Jasmine PangKim  Fujinaga M.D.   On: 09/25/2018 15:00   Vas [place] Lower Extremity Venous (dvt) (only [facility])  Result Date: 09/25/2018  Lower Venous Study Indications: Pain, Swelling, and Edema.  Limitations: Musculoskeletal features and body habitus. Comparison Study: No prior study available Performing Technologist: Melodie BouillonSelina Cole  Examination Guidelines: A complete evaluation includes B-mode imaging, spectral Doppler, color Doppler, and power Doppler as needed of all accessible portions of each vessel. Bilateral testing is considered an integral part of a complete examination. Limited examinations for reoccurring indications may be performed as noted.  Right Venous Findings: +---+---------------+---------+-----------+----------+-------+    CompressibilityPhasicitySpontaneityPropertiesSummary +---+---------------+---------+-----------+----------+-------+ CFVFull           Yes      Yes                          +---+---------------+---------+-----------+----------+-------+  Left Venous Findings: +---------+---------------+---------+-----------+----------+-------+          CompressibilityPhasicitySpontaneityPropertiesSummary +---------+---------------+---------+-----------+----------+-------+ CFV      Full           Yes      Yes                          +---------+---------------+---------+-----------+----------+-------+ SFJ      Full                                                  +---------+---------------+---------+-----------+----------+-------+ FV Prox  Full                                                 +---------+---------------+---------+-----------+----------+-------+  FV Mid   Full                                                 +---------+---------------+---------+-----------+----------+-------+ FV DistalFull                                                 +---------+---------------+---------+-----------+----------+-------+ PFV      Full                                                 +---------+---------------+---------+-----------+----------+-------+ POP      Full           Yes      Yes                          +---------+---------------+---------+-----------+----------+-------+ PTV      Full                                                 +---------+---------------+---------+-----------+----------+-------+ PERO     Full                                                 +---------+---------------+---------+-----------+----------+-------+    Summary: Right: No evidence of common femoral vein obstruction. Left: There is no evidence of deep vein thrombosis in the lower extremity. No cystic structure found in the popliteal fossa.  *See table(s) above for measurements and observations. Electronically signed by Waverly Ferrari MD on 09/25/2018 at 2:44:28 PM.    Final     Procedures Procedures (including critical care time)  Medications Ordered in ED Medications - No data to display   Initial Impression / Assessment and Plan / ED Course  I have reviewed the triage vital signs and the nursing notes.  Pertinent labs & imaging results that were available during my care of the patient were reviewed by me and considered in my medical decision making (see chart for details).        Labs:   Imaging: Lower extremity venous, DG hand  complete  Consults:  Therapeutics:  Discharge Meds:   Assessment/Plan: 23 year old female presents today with several complaints.  Both are likely muscular in nature.  Patient with no edema on my exam, no signs of DVT.  She does have some minor bruising along the anterior aspect of the knee.  She will follow-up with her primary care for reevaluation.  Patient also with pain in her hand this is atraumatic appearing.  Patient discharged with symptomatic care and strict return precautions.  She verbalized understanding and agreement to today's plan had no further questions or concerns.    Final Clinical Impressions(s) / ED Diagnoses   Final diagnoses:  Left leg pain  Pain of left hand    ED Discharge Orders    None  Eyvonne Mechanic, PA-C 09/25/18 1538    Loren Racer, MD 09/26/18 [phone]

## 2018-09-25 NOTE — ED Triage Notes (Signed)
Pt here for evaluation of left leg pain that has become progressively worse over the last month. Also c/o left third and fourth digit pain on her hand. States she was "playing around and jammed her fingers yesterday." Ambulatory without difficulty to room. A/Ox4.

## 2018-09-25 NOTE — ED Notes (Signed)
ED Provider at bedside. 

## 2018-09-25 NOTE — Discharge Instructions (Signed)
Please read attached information. If you experience any new or worsening signs or symptoms please return to the emergency room for evaluation. Please follow-up with your primary care provider or specialist as discussed. Please use tylenol or ibuprofen as needed for pain.  °

## 2018-09-25 NOTE — ED Notes (Signed)
Vascular ultrasound bedside.

## 2019-01-14 ENCOUNTER — Ambulatory Visit (HOSPITAL_COMMUNITY)
Admission: EM | Admit: 2019-01-14 | Discharge: 2019-01-14 | Disposition: A | Discharge status: Home / Self Care | Payer: Medicaid Other | Attending: Family Medicine | Admitting: Family Medicine

## 2019-01-14 ENCOUNTER — Other Ambulatory Visit: Payer: Self-pay

## 2019-01-14 ENCOUNTER — Encounter (HOSPITAL_COMMUNITY): Payer: Self-pay

## 2019-01-14 DIAGNOSIS — K047 Periapical abscess without sinus: Secondary | ICD-10-CM

## 2019-01-14 MED ORDER — HYDROCODONE-ACETAMINOPHEN 5-325 MG PO TABS
ORAL_TABLET | ORAL | Status: AC
  Filled 2019-01-14: qty 1

## 2019-01-14 MED ORDER — HYDROCODONE-ACETAMINOPHEN 5-325 MG PO TABS: 1.0000 | ORAL_TABLET | Freq: Four times a day (QID) | ORAL | 0 refills | Status: DC | PRN

## 2019-01-14 MED ORDER — AMOXICILLIN-POT CLAVULANATE 875-125 MG PO TABS: 1.0000 | ORAL_TABLET | Freq: Two times a day (BID) | ORAL | 0 refills | Status: AC

## 2019-01-14 MED ORDER — IBUPROFEN 800 MG PO TABS: 800.0000 mg | ORAL_TABLET | Freq: Three times a day (TID) | ORAL | 0 refills | Status: DC

## 2019-01-14 MED ORDER — HYDROCODONE-ACETAMINOPHEN 5-325 MG PO TABS
1.0000 | ORAL_TABLET | Freq: Once | ORAL | Status: AC
  Administered 2019-01-14: 1 via ORAL

## 2019-01-14 NOTE — Discharge Instructions (Signed)

## 2019-01-14 NOTE — ED Triage Notes (Signed)
Pt presents with dental pain and oral swelling on left side X 3 days.

## 2019-01-15 ENCOUNTER — Other Ambulatory Visit: Payer: Self-pay

## 2019-01-15 ENCOUNTER — Emergency Department (HOSPITAL_COMMUNITY)
Admission: EM | Admit: 2019-01-15 | Discharge: 2019-01-15 | Disposition: A | Discharge status: Home / Self Care | Payer: Medicaid Other | Attending: Emergency Medicine | Admitting: Emergency Medicine

## 2019-01-15 ENCOUNTER — Encounter (HOSPITAL_COMMUNITY): Payer: Self-pay | Admitting: Emergency Medicine

## 2019-01-15 ENCOUNTER — Emergency Department (HOSPITAL_COMMUNITY): Payer: Medicaid Other

## 2019-01-15 ENCOUNTER — Ambulatory Visit (HOSPITAL_COMMUNITY)
Admission: EM | Admit: 2019-01-15 | Discharge: 2019-01-15 | Disposition: A | Discharge status: Home / Self Care | Payer: Medicaid Other | Attending: Internal Medicine | Admitting: Internal Medicine

## 2019-01-15 DIAGNOSIS — Z79899 Other long term (current) drug therapy: Secondary | ICD-10-CM | POA: Diagnosis not present

## 2019-01-15 DIAGNOSIS — K122 Cellulitis and abscess of mouth: Secondary | ICD-10-CM

## 2019-01-15 DIAGNOSIS — L03211 Cellulitis of face: Secondary | ICD-10-CM | POA: Diagnosis not present

## 2019-01-15 DIAGNOSIS — K0889 Other specified disorders of teeth and supporting structures: Secondary | ICD-10-CM | POA: Diagnosis present

## 2019-01-15 DIAGNOSIS — K047 Periapical abscess without sinus: Secondary | ICD-10-CM | POA: Insufficient documentation

## 2019-01-15 LAB — CBC WITH DIFFERENTIAL/PLATELET
Abs Immature Granulocytes: 0.03 10*3/uL (ref 0.00–0.07)
Basophils Absolute: 0 10*3/uL (ref 0.0–0.1)
Basophils Relative: 0 %
Eosinophils Absolute: 0 10*3/uL (ref 0.0–0.5)
Eosinophils Relative: 0 %
HCT: 35.7 % — ABNORMAL LOW (ref 36.0–46.0)
Hemoglobin: 11.6 g/dL — ABNORMAL LOW (ref 12.0–15.0)
Immature Granulocytes: 0 %
Lymphocytes Relative: 20 %
Lymphs Abs: 1.7 10*3/uL (ref 0.7–4.0)
MCH: 27 pg (ref 26.0–34.0)
MCHC: 32.5 g/dL (ref 30.0–36.0)
MCV: 83 fL (ref 80.0–100.0)
Monocytes Absolute: 0.6 10*3/uL (ref 0.1–1.0)
Monocytes Relative: 7 %
Neutro Abs: 6.2 10*3/uL (ref 1.7–7.7)
Neutrophils Relative %: 73 %
Platelets: 362 10*3/uL (ref 150–400)
RBC: 4.3 MIL/uL (ref 3.87–5.11)
RDW: 14.6 % (ref 11.5–15.5)
WBC: 8.6 10*3/uL (ref 4.0–10.5)
nRBC: 0 % (ref 0.0–0.2)

## 2019-01-15 LAB — BASIC METABOLIC PANEL
Anion gap: 11 (ref 5–15)
BUN: 7 mg/dL (ref 6–20)
CO2: 23 mmol/L (ref 22–32)
Calcium: 9.5 mg/dL (ref 8.9–10.3)
Chloride: 103 mmol/L (ref 98–111)
Creatinine, Ser: 0.75 mg/dL (ref 0.44–1.00)
GFR calc Af Amer: >60 mL/min (ref >60)
GFR calc non Af Amer: >60 mL/min (ref >60)
Glucose, Bld: 89 mg/dL (ref 70–99)
Potassium: 3.3 mmol/L — ABNORMAL LOW (ref 3.5–5.1)
Sodium: 137 mmol/L (ref 135–145)

## 2019-01-15 LAB — POC URINE PREG, ED: Preg Test, Ur: NEGATIVE

## 2019-01-15 LAB — I-STAT BETA HCG BLOOD, ED (MC, WL, AP ONLY): I-stat hCG, quantitative: 8.5 m[IU]/mL — ABNORMAL HIGH (ref <5)

## 2019-01-15 MED ORDER — CLINDAMYCIN HCL 150 MG PO CAPS: 450.0000 mg | ORAL_CAPSULE | Freq: Three times a day (TID) | ORAL | 0 refills | Status: DC

## 2019-01-15 MED ORDER — MORPHINE SULFATE (PF) 4 MG/ML IV SOLN
4.0000 mg | Freq: Once | INTRAVENOUS | Status: AC
  Administered 2019-01-15: 4 mg via INTRAVENOUS
  Filled 2019-01-15: qty 1

## 2019-01-15 MED ORDER — KETOROLAC TROMETHAMINE 30 MG/ML IJ SOLN
30.0000 mg | Freq: Once | INTRAMUSCULAR | Status: AC
  Administered 2019-01-15: 30 mg via INTRAMUSCULAR

## 2019-01-15 MED ORDER — OXYCODONE-ACETAMINOPHEN 5-325 MG PO TABS: 1.0000 | ORAL_TABLET | Freq: Four times a day (QID) | ORAL | 0 refills | Status: DC | PRN

## 2019-01-15 MED ORDER — OXYCODONE-ACETAMINOPHEN 5-325 MG PO TABS: 1.0000 | ORAL_TABLET | Freq: Once | ORAL | Status: DC

## 2019-01-15 MED ORDER — IOHEXOL 300 MG/ML  SOLN
75.0000 mL | Freq: Once | INTRAMUSCULAR | Status: AC | PRN
  Administered 2019-01-15: 75 mL via INTRAVENOUS

## 2019-01-15 MED ORDER — CLINDAMYCIN PHOSPHATE 600 MG/50ML IV SOLN
600.0000 mg | Freq: Once | INTRAVENOUS | Status: AC
  Administered 2019-01-15: 600 mg via INTRAVENOUS
  Filled 2019-01-15: qty 50

## 2019-01-15 MED ORDER — KETOROLAC TROMETHAMINE 30 MG/ML IJ SOLN
INTRAMUSCULAR | Status: AC
  Filled 2019-01-15: qty 1

## 2019-01-15 NOTE — ED Provider Notes (Signed)
[facility]    CSN: [id number] Arrival date & time: 01/15/19  1629     History   Chief Complaint Chief Complaint  Patient presents with  . Dental Pain  . Follow-up    HPI Phyllis Kidd is a 23 y.o. female with no past medical history comes to urgent care with a 3-day history of dental pain.  Patient was seen in urgent care yesterday and started on Augmentin.  Patient swelling was localized to the left mandible in the region of tooth #19 on the left side.  Patient returns to urgent care today with complaints of worsening swelling, erythema and extension of the swelling to be right side of the face.  Patient is complaining of some fever with no chills.  Patient complains of difficulty breathing.  Pain medications are not helping with severe pain.  Pain is aggravated by opening her mouth and to touch.  Pain is currently 10 out of 10.   Past Medical History:  Diagnosis Date  . Anxiety 2016  . Herpes simplex virus (HSV) infection   . VBAC, delivered     Patient Active Problem List   Diagnosis Date Noted  . 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 CESAREAN SECTION  01/18/2016    OB History    Gravida  2   Para  2   Term  2   Preterm  0   AB  0   Living  2     SAB  0   TAB  0   Ectopic  0   Multiple  0   Live Births  2            Home Medications    Prior to Admission medications   Medication Sig Start Date End Date Taking? Authorizing Provider  amoxicillin-clavulanate (AUGMENTIN) 875-125 MG tablet Take 1 tablet by mouth every 12 (twelve) hours for 7 days. 01/14/19 01/21/19  Wieters, Hallie C, PA-C  HYDROcodone-acetaminophen (NORCO/VICODIN) 5-325 MG tablet Take 1 tablet by mouth every 6 (six) hours as needed for severe pain. 01/14/19   Wieters, Hallie C, PA-C  ibuprofen (ADVIL) 800 MG tablet Take 1 tablet (800 mg total) by mouth 3 (three) times daily. 01/14/19   Wieters,  Hallie C, PA-C  medroxyPROGESTERone (DEPO-PROVERA) 150 MG/ML injection Inject 1 mL (150 mg total) into the muscle every 3 (three) months. 02/16/18   Roe Coombsenney, Rachelle A, CNM    Family History Family History  Family history unknown: Yes    Social History Social History   Tobacco Use  . Smoking status: Never Smoker  . Smokeless tobacco: Never Used  Substance Use Topics  . Alcohol use: No  . Drug use: No     Allergies   Patient has no known allergies.   Review of Systems Review of Systems  Constitutional: Positive for fever. Negative for activity change, appetite change, chills and fatigue.  HENT: Positive for dental problem and mouth sores. Negative for drooling, ear discharge, ear pain, postnasal drip, rhinorrhea, sinus pressure, sinus pain and sore throat.   Respiratory: Positive for shortness of breath. Negative for wheezing and stridor.   Cardiovascular: Negative.   Gastrointestinal: Negative.   Endocrine: Negative.   Neurological: Positive for facial asymmetry and headaches. Negative for dizziness, syncope, speech difficulty, weakness, light-headedness and numbness.  Hematological: Negative.   Psychiatric/Behavioral: Negative.   All other systems reviewed and are negative.    Physical Exam  Triage Vital Signs ED Triage Vitals  Enc Vitals Group     BP 01/15/19 1652 135/84     Pulse Rate 01/15/19 1652 (!) 125     Resp 01/15/19 1652 18     Temp 01/15/19 1652 99.6 F (37.6 C)     Temp Source 01/15/19 1652 Oral     SpO2 01/15/19 1652 97 %     Weight --      Height --      Head Circumference --      Peak Flow --      Pain Score 01/15/19 1653 10     Pain Loc --      Pain Edu? --      Excl. in [place]? --    No data found.  Updated Vital Signs BP 135/84 (BP Location: Right Arm)   Pulse (!) 125   Temp 99.6 F (37.6 C) (Oral)   Resp 18   LMP 12/26/2018   SpO2 97%   Visual Acuity Right Eye Distance:   Left Eye Distance:   Bilateral Distance:    Right Eye  Near:   Left Eye Near:    Bilateral Near:     Physical Exam Constitutional:      General: She is in acute distress.     Appearance: She is ill-appearing and toxic-appearing.  HENT:     Right Ear: Tympanic membrane normal.     Left Ear: Tympanic membrane normal.     Nose: No congestion or rhinorrhea.     Mouth/Throat:     Mouth: Mucous membranes are moist.     Pharynx: No oropharyngeal exudate.     Comments: Difficulty examining the pharynx secondary to pain on opening mouth.  Poor dental hygiene with dental cavity on the left mandibular premolar. Eyes:     Conjunctiva/sclera: Conjunctivae normal.  Neck:     Musculoskeletal: Neck supple. Muscular tenderness present. No neck rigidity.  Cardiovascular:     Rate and Rhythm: Tachycardia present.     Pulses: Normal pulses.     Heart sounds: Normal heart sounds.  Pulmonary:     Effort: Pulmonary effort is normal. No respiratory distress.     Breath sounds: Normal breath sounds. No stridor. No wheezing.  Abdominal:     General: Bowel sounds are normal.     Palpations: Abdomen is soft.  Musculoskeletal: Normal range of motion.  Lymphadenopathy:     Cervical: Cervical adenopathy present.  Skin:    Capillary Refill: Capillary refill takes less than 2 seconds.  Neurological:     General: No focal deficit present.     Mental Status: She is alert.      UC Treatments / Results  Labs (all labs ordered are listed, but only abnormal results are displayed) Labs Reviewed - No data to display  EKG None  Radiology No results found.  Procedures Procedures (including critical care time)  Medications Ordered in UC Medications  ketorolac (TORADOL) 30 MG/ML injection 30 mg (has no administration in time range)    Initial Impression / Assessment and Plan / UC Course  I have reviewed the triage vital signs and the nursing notes.  Pertinent labs & imaging results that were available during my care of the patient were reviewed by me  and considered in my medical decision making (see chart for details).     1.  Dental abscess with rapidly progressive swelling involving the submandibular area (concerning for Ludwig's angina): Toradol IM given for pain Patient is  advised to go to emergency department for further evaluation, possible IV antibiotics Patient will need tooth extraction    Final Clinical Impressions(s) / UC Diagnoses   Final diagnoses:  Ludwig's angina   Discharge Instructions   None    ED Prescriptions    None     Controlled Substance Prescriptions [place] Controlled Substance Registry consulted? No   Merrilee JanskyLamptey, Kayte Borchard O, MD 01/15/19 1740

## 2019-01-15 NOTE — ED Notes (Signed)
Patient transported to CT 

## 2019-01-15 NOTE — ED Provider Notes (Signed)
[facility]    CSN: [id number] Arrival date & time: 01/14/19  1420     History   Chief Complaint Chief Complaint  Patient presents with  . Dental Pain  . Oral Swelling    HPI Phyllis Kidd is a 23 y.o. female no contributing past medical history presenting today for evaluation of dental pain and swelling.  Patient states that over the past 3 days she has had progressively worsening pain and swelling to her left lower jaw.  She notes that she has a tooth with a hole in it where she has had the majority of her pain.  She is also noticed that she is coming in in the back of her jaw that is also contributing.  She denies any fevers or difficulty moving neck.  Denies difficulty swallowing.  She has been taking ibuprofen and Tylenol without relief.  Pain is brought her to tears.  She has been unable to sleep in the past 3 days due to pain.  HPI  Past Medical History:  Diagnosis Date  . Anxiety 2016  . Herpes simplex virus (HSV) infection   . VBAC, delivered     Patient Active Problem List   Diagnosis Date Noted  . 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 CESAREAN SECTION  01/18/2016    OB History    Gravida  2   Para  2   Term  2   Preterm  0   AB  0   Living  2     SAB  0   TAB  0   Ectopic  0   Multiple  0   Live Births  2            Home Medications    Prior to Admission medications   Medication Sig Start Date End Date Taking? Authorizing Provider  amoxicillin-clavulanate (AUGMENTIN) 875-125 MG tablet Take 1 tablet by mouth every 12 (twelve) hours for 7 days. 01/14/19 01/21/19  ,  C, PA-C  HYDROcodone-acetaminophen (NORCO/VICODIN) 5-325 MG tablet Take 1 tablet by mouth every 6 (six) hours as needed for severe pain. 01/14/19   ,  C, PA-C  ibuprofen (ADVIL) 800 MG tablet Take 1 tablet (800 mg total) by mouth 3 (three) times daily. 01/14/19    ,  C, PA-C  medroxyPROGESTERone (DEPO-PROVERA) 150 MG/ML injection Inject 1 mL (150 mg total) into the muscle every 3 (three) months. 02/16/18   Roe Coombsenney, Rachelle A, CNM    Family History Family History  Family history unknown: Yes    Social History Social History   Tobacco Use  . Smoking status: Never Smoker  . Smokeless tobacco: Never Used  Substance Use Topics  . Alcohol use: No  . Drug use: No     Allergies   Patient has no known allergies.   Review of Systems Review of Systems  Constitutional: Negative for activity change, appetite change, chills, fatigue and fever.  HENT: Positive for dental problem and facial swelling. Negative for congestion, ear pain, rhinorrhea, sinus pressure, sore throat and trouble swallowing.   Eyes: Negative for discharge and redness.  Respiratory: Negative for cough, chest tightness and shortness of breath.   Cardiovascular: Negative for chest pain.  Gastrointestinal: Negative for abdominal pain, diarrhea, nausea and vomiting.  Musculoskeletal: Negative for myalgias.  Skin: Negative for rash.  Neurological: Negative for dizziness, light-headedness and headaches.     Physical Exam Triage Vital  Signs ED Triage Vitals  Enc Vitals Group     BP 01/14/19 1532 140/87     Pulse Rate 01/14/19 1532 80     Resp 01/14/19 1532 16     Temp 01/14/19 1532 98.4 F (36.9 C)     Temp Source 01/14/19 1532 Oral     SpO2 01/14/19 1532 100 %     Weight --      Height --      Head Circumference --      Peak Flow --      Pain Score 01/14/19 1542 10     Pain Loc --      Pain Edu? --      Excl. in [place]? --    No data found.  Updated Vital Signs BP 140/87 (BP Location: Right Arm)   Pulse 80   Temp 98.4 F (36.9 C) (Oral)   Resp 16   LMP 12/26/2018   SpO2 100%   Visual Acuity Right Eye Distance:   Left Eye Distance:   Bilateral Distance:    Right Eye Near:   Left Eye Near:    Bilateral Near:     Physical Exam Vitals signs  and nursing note reviewed.  Constitutional:      Appearance: She is well-developed.     Comments: Tearful and emotional in room, appears to be in pain  HENT:     Head: Normocephalic and atraumatic.     Comments: Swelling noted to left lower jaw, tender to touch over this area, no erythema    Ears:     Comments: Bilateral ears without tenderness to palpation of external auricle, tragus and mastoid, EAC's without erythema or swelling, TM's with good bony landmarks and cone of light. Non erythematous.     Mouth/Throat:     Comments: Oral mucosa pink and moist, no tonsillar enlargement or exudate. Posterior pharynx patent and nonerythematous, no uvula deviation or swelling. Normal phonation.  Left lower jaw with cracked, decaying teeth at approximately #19, surrounding gingival erythema and swelling, significant tender to touch, posterior molar has partial coverage of gingiva as if this were coming in.  Mild tenderness around this tooth.  No soft palate swelling, posterior pharynx patent, uvula midline Eyes:     Conjunctiva/sclera: Conjunctivae normal.  Neck:     Musculoskeletal: Neck supple.     Comments: Full active range of motion of neck, no lymphadenopathy, no overlying erythema or swelling Cardiovascular:     Rate and Rhythm: Normal rate and regular rhythm.     Heart sounds: No murmur.  Pulmonary:     Effort: Pulmonary effort is normal. No respiratory distress.     Breath sounds: Normal breath sounds.  Abdominal:     Palpations: Abdomen is soft.     Tenderness: There is no abdominal tenderness.  Skin:    General: Skin is warm and dry.  Neurological:     Mental Status: She is alert.      UC Treatments / Results  Labs (all labs ordered are listed, but only abnormal results are displayed) Labs Reviewed - No data to display  EKG None  Radiology No results found.  Procedures Procedures (including critical care time)  Medications Ordered in UC Medications   HYDROcodone-acetaminophen (NORCO/VICODIN) 5-325 MG per tablet 1 tablet (1 tablet Oral Given 01/14/19 1624)    Initial Impression / Assessment and Plan / UC Course  I have reviewed the triage vital signs and the nursing notes.  Pertinent labs & imaging  results that were available during my care of the patient were reviewed by me and considered in my medical decision making (see chart for details).     Patient appears to have dental abscess, will treat with Augmentin.  Continue Tylenol and ibuprofen for mild to moderate pain.  Did provide 2 days worth of hydrocodone to use for severe pain and advised to use sparingly and only at bedtime.  Do not drive or work after taking.  Patient had drive her home, did provide 1 tablet in clinic prior to discharge.  Continue to monitor, follow-up if not resolving or worsening despite use of Augmentin.Discussed strict return precautions. Patient verbalized understanding and is agreeable with plan.  Final Clinical Impressions(s) / UC Diagnoses   Final diagnoses:  Dental abscess     Discharge Instructions     Please use dental resource to contact offices to seek permenant treatment/relief.   Today we have given you an antibiotic. This should help with pain as any infection is cleared.   For pain please take 600mg -800mg  of Ibuprofen every 8 hours, take with 1000 mg of Tylenol Extra strength every 8 hours. These are safe to take together. Please take with food.   I have also provided 2 days worth of stronger pain medication. This should only be used for severe pain. Do not drive or operate machinery while taking this medication.   Please return if you start to experience significant swelling of your face, experiencing fever.   ED Prescriptions    Medication Sig Dispense Auth. Provider   amoxicillin-clavulanate (AUGMENTIN) 875-125 MG tablet Take 1 tablet by mouth every 12 (twelve) hours for 7 days. 14 tablet ,  C, PA-C   ibuprofen (ADVIL)  800 MG tablet Take 1 tablet (800 mg total) by mouth 3 (three) times daily. 21 tablet ,  C, PA-C   HYDROcodone-acetaminophen (NORCO/VICODIN) 5-325 MG tablet Take 1 tablet by mouth every 6 (six) hours as needed for severe pain. 8 tablet , [place] C, PA-C     Controlled Substance Prescriptions [place] Controlled Substance Registry consulted? Yes, I have consulted the [place] Controlled Substances Registry for this patient, and feel the risk/benefit ratio today is favorable for proceeding with this prescription for a controlled substance.   Lew Dawes,  C, [place]PA-C 01/15/19 [phone]

## 2019-01-15 NOTE — Discharge Instructions (Addendum)
Please read attached information. If you experience any new or worsening signs or symptoms please return to the emergency room for evaluation. Please follow-up with your primary care provider or specialist as discussed. Please use medication prescribed only as directed and discontinue taking if you have any concerning signs or symptoms.   °

## 2019-01-15 NOTE — ED Triage Notes (Signed)
Pt c/o left sided dental pain and swelling x 3 days

## 2019-01-15 NOTE — ED Provider Notes (Signed)
Phyllis Kidd   CSN: [id number] Arrival date & time: 01/15/19  1738    History   Chief Complaint Chief Complaint  Patient presents with  . Dental Problem    HPI Phyllis Kidd is a 23 y.o. female.     HPI   23 year old female presents today with complaints of dental pain and swelling.  Patient notes proximate 3 days ago she developed pain to the left lower jaw and gumline.  She notes she was seen at urgent care yesterday.  She was given pain medication and started on antibiotics (Augmentin).  She notes his symptoms have worsened, she notes pain and swelling under her jaw.  She denies fever. She does Kidd some discharge today.  No history of the same.  She notes taking pain medication without improvement in symptoms.  In urgent care today and referred to the emergency room for further evaluation.  Past Medical History:  Diagnosis Date  . Anxiety 2016  . Herpes simplex virus (HSV) infection   . VBAC, delivered     Patient Active Problem List   Diagnosis Date Noted  . 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 CESAREAN SECTION  01/18/2016     OB History    Gravida  2   Para  2   Term  2   Preterm  0   AB  0   Living  2     SAB  0   TAB  0   Ectopic  0   Multiple  0   Live Births  2            Home Medications    Prior to Admission medications   Medication Sig Start Date End Date Taking? Authorizing Provider  amoxicillin-clavulanate (AUGMENTIN) 875-125 MG tablet Take 1 tablet by mouth every 12 (twelve) hours for 7 days. 01/14/19 01/21/19  Wieters, Hallie C, PA-C  clindamycin (CLEOCIN) 150 MG capsule Take 3 capsules (450 mg total) by mouth 3 (three) times daily. 01/15/19   Adrik Khim, Dellis Filbert, PA-C  HYDROcodone-acetaminophen (NORCO/VICODIN) 5-325 MG tablet Take 1 tablet by mouth every 6 (six) hours as needed for severe pain. 01/14/19    Wieters, Hallie C, PA-C  ibuprofen (ADVIL) 800 MG tablet Take 1 tablet (800 mg total) by mouth 3 (three) times daily. 01/14/19   Wieters, Hallie C, PA-C  medroxyPROGESTERone (DEPO-PROVERA) 150 MG/ML injection Inject 1 mL (150 mg total) into the muscle every 3 (three) months. 02/16/18   Morene Crocker, CNM    Family History Family History  Family history unknown: Yes    Social History Social History   Tobacco Use  . Smoking status: Never Smoker  . Smokeless tobacco: Never Used  Substance Use Topics  . Alcohol use: No  . Drug use: No     Allergies   Patient has no known allergies.   Review of Systems Review of Systems  All other systems reviewed and are negative.    Physical Exam Updated Vital Signs BP 108/72   Pulse 73   Temp 99.8 F (37.7 C) (Oral)   Resp 19   Ht 5\' 3"  (1.6 m)   Wt 79.8 kg   LMP 12/26/2018   SpO2 99%   BMI 31.18 kg/m   Physical Exam Vitals signs and nursing Kidd reviewed.  Constitutional:      Appearance: She is well-developed.  HENT:  Head: Normocephalic and atraumatic.     Comments: Obvious swelling noted to the left jaw and submandibular space-minor overlying-fluctuance noted on the left lateral gumline with small drainage-floor the mouth is soft, submandibular soft tissue tender to palpation and firm-no swelling of the neck this is supple with full active range of motion    Mouth/Throat:     Comments: Oropharynx without swelling edema or erythema Eyes:     General: No scleral icterus.       Right eye: No discharge.        Left eye: No discharge.     Conjunctiva/sclera: Conjunctivae normal.     Pupils: Pupils are equal, round, and reactive to light.  Neck:     Musculoskeletal: Normal range of motion.     Vascular: No JVD.     Trachea: No tracheal deviation.     Comments: Neck is supple full active range of motion Pulmonary:     Effort: Pulmonary effort is normal.     Breath sounds: No stridor.  Neurological:     Mental  Status: She is alert and oriented to person, place, and time.     Coordination: Coordination normal.  Psychiatric:        Behavior: Behavior normal.        Thought Content: Thought content normal.        Judgment: Judgment normal.      ED Treatments / Results  Labs (all labs ordered are listed, but only abnormal results are displayed) Labs Reviewed  CBC WITH DIFFERENTIAL/PLATELET - Abnormal; Notable for the following components:      Result Value   Hemoglobin 11.6 (*)    HCT 35.7 (*)    All other components within normal limits  BASIC METABOLIC PANEL - Abnormal; Notable for the following components:   Potassium 3.3 (*)    All other components within normal limits  I-STAT BETA HCG BLOOD, ED ([facility], [facility], [facility] ONLY) - Abnormal; Notable for the following components:   I-stat hCG, quantitative 8.5 (*)    All other components within normal limits  POC URINE PREG, ED    EKG None  Radiology Ct Soft Tissue Neck W Contrast  Result Date: 01/15/2019 CLINICAL DATA:  Left-sided dental pain and swelling over the last 3 days. EXAM: CT NECK WITH CONTRAST TECHNIQUE: Multidetector CT imaging of the neck was performed using the standard protocol following the bolus administration of intravenous contrast. CONTRAST:  75mL OMNIPAQUE IOHEXOL 300 MG/ML  SOLN COMPARISON:  08/04/2018 FINDINGS: Pharynx and larynx: No mucosal or submucosal lesion. Salivary glands: Parotid and submandibular glands are normal. Thyroid: Normal Lymph nodes: No enlarged or low-density nodes on either side of the neck. Vascular: Normal Limited intracranial: Normal Visualized orbits: Normal Mastoids and visualized paranasal sinuses: Clear Skeleton: Dental decay a affecting left mandibular tooth 19. Periodontal disease with radicular cysts that have gotten worse since the previous study. This includes lateral cortical erosion of the anterior root. This is associated with the regional soft tissue swelling described below. Upper chest: Normal  Other: Soft tissue swelling of the left side of the lower face, likely odontogenic from left mandibular tooth 19. No evidence of low-density or drainable abscess. Findings consistent with regional cellulitis. 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 Radicular cysts including lateral cortical erosion of the anterior root. Adjacent soft tissue inflammatory changes of the lower left face consistent with facial cellulitis. No sign of drainable soft tissue abscess. Electronically Signed   By: Loraine LericheMark  Shogry M.D.   On: 01/15/2019 20:07    Procedures Procedures (including critical care time)  Medications Ordered in ED Medications  clindamycin (CLEOCIN) IVPB 600 mg (has no administration in time range)  morphine 4 MG/ML injection 4 mg (4 mg Intravenous Given 01/15/19 1806)  iohexol (OMNIPAQUE) 300 MG/ML solution 75 mL (75 mLs Intravenous Contrast Given 01/15/19 1945)     Initial Impression / Assessment and Plan / ED Course  I have reviewed the triage vital signs and the nursing notes.  Pertinent labs & imaging results that were available during my care of the patient were reviewed by me and considered in my medical decision making (see chart for details).        Labs: CBC, BMP, i-STAT beta-hCG, point-of-care urine pregnancy  Imaging: CT soft tissue neck  Consults:  Therapeutics: Morphine, clindamycin  Discharge Meds: Clindamycin  Assessment/Plan: 23 year old female presents today dental abscess and cellulitis.  Patient has area that is already draining presently.  I did numb up the area and widen the incision.  CT scan showing cellulitis but no fluid collection.  No signs of Ludwig's.  He is afebrile with no elevation in white count.  I will switch her to clindamycin, give her a dose IV before she goes home this evening.  I do feel she is stable for outpatient management.  She will be given a prescription of Percocet as the Norco is not helping her symptoms she  only received 8 last night, she will be given 6 today.  She will return immediately if she develops any new or worsening signs or symptoms, return in 2 days if symptoms are not improving, she will schedule appointment with surgery.  She verbalized understanding and agreement to today's plan and had no further questions or concerns.  Case management will be consulted for assistance with antibiotics as patient reports she is unable to afford antibiotics.      Final Clinical Impressions(s) / ED Diagnoses   Final diagnoses:  Dental abscess    ED Discharge Orders         Ordered    clindamycin (CLEOCIN) 150 MG capsule  3 times daily     01/15/19 2037           Eyvonne MechanicHedges, Rowen Wilmer, PA-C 01/15/19 2047    Linwood DibblesKnapp, Jon, MD 01/17/19 [phone]

## 2019-01-15 NOTE — ED Triage Notes (Signed)
Pt here for left sided dental pain with swelling; seen here yesterday and returned due to feeling worse; pt sts taken 2 doses of antibiotics

## 2019-01-15 NOTE — Care Management (Signed)
ED CM received consult concerning medication assistance, CM reviewed patient's record, no health insurance listed,confirmed information with patient. Patient is eligible for MATCH. Discussed MATCH program and the guidelines including the  $3 co-pay per prescription. Pt verbalized understanding and is agreeable with accepting the assistance. But does report she does not have the co-pay at this time she will not have the money until next week.  Pt enrolled and [place] letter printed and given to patient with list of participating pharmacies.  Patient was instructed to present  prescriptions with [facility] letter to Pharmacy. Patient verbalizes  understanding and appreciation for the assistance. Reminded patient of the importance and benefits  of following up with PCP after ED visit  Pt verbalized understanding teach back done. No further ED CM needs identified.

## 2019-01-15 NOTE — ED Provider Notes (Signed)
Discussed with case management, pt's rx sent to [facility] for medication match. Pt completed abx. Pt d/c.   Franchot Heidelberg, PA-C 01/15/19 2221    Dorie Rank, MD 01/17/19 [phone]

## 2019-03-27 ENCOUNTER — Ambulatory Visit: Payer: Medicaid Other | Admitting: Obstetrics and Gynecology

## 2019-03-27 ENCOUNTER — Other Ambulatory Visit (HOSPITAL_COMMUNITY)
Admission: RE | Admit: 2019-03-27 | Discharge: 2019-03-27 | Disposition: A | Discharge status: Home / Self Care | Payer: Medicaid Other | Source: Ambulatory Visit | Attending: Obstetrics and Gynecology | Admitting: Obstetrics and Gynecology

## 2019-03-27 ENCOUNTER — Ambulatory Visit (INDEPENDENT_AMBULATORY_CARE_PROVIDER_SITE_OTHER): Payer: Medicaid Other | Admitting: Obstetrics and Gynecology

## 2019-03-27 ENCOUNTER — Encounter: Payer: Self-pay | Admitting: Obstetrics and Gynecology

## 2019-03-27 ENCOUNTER — Other Ambulatory Visit: Payer: Self-pay

## 2019-03-27 VITALS — BP 109/71 | HR 77 | Temp 98.7°F | Ht 63.0 in | Wt 187.7 lb

## 2019-03-27 DIAGNOSIS — Z124 Encounter for screening for malignant neoplasm of cervix: Secondary | ICD-10-CM

## 2019-03-27 DIAGNOSIS — Z3202 Encounter for pregnancy test, result negative: Secondary | ICD-10-CM | POA: Diagnosis not present

## 2019-03-27 DIAGNOSIS — Z30013 Encounter for initial prescription of injectable contraceptive: Secondary | ICD-10-CM

## 2019-03-27 DIAGNOSIS — R52 Pain, unspecified: Secondary | ICD-10-CM

## 2019-03-27 DIAGNOSIS — N898 Other specified noninflammatory disorders of vagina: Secondary | ICD-10-CM | POA: Diagnosis not present

## 2019-03-27 DIAGNOSIS — L905 Scar conditions and fibrosis of skin: Secondary | ICD-10-CM

## 2019-03-27 DIAGNOSIS — M79641 Pain in right hand: Secondary | ICD-10-CM

## 2019-03-27 LAB — POCT URINE PREGNANCY: Preg Test, Ur: NEGATIVE

## 2019-03-27 MED ORDER — MEDROXYPROGESTERONE ACETATE 150 MG/ML IM SUSP
150.0000 mg | Freq: Once | INTRAMUSCULAR | Status: AC
  Administered 2019-03-27: 16:00:00 150 mg via INTRAMUSCULAR

## 2019-03-27 MED ORDER — MEDROXYPROGESTERONE ACETATE 150 MG/ML IM SUSP: 150.0000 mg | INTRAMUSCULAR | 3 refills | Status: DC

## 2019-03-27 NOTE — Progress Notes (Addendum)
GYN presents for AEX/PAP.  She wants to restart her DEPO.  Her last one was last April. C/o pain in her both hands 10/10 and they lock up. GAD-7=2  UPT Today is NEGATIVE. Next DEPO November 4-18/2020  Administrations This Visit    medroxyPROGESTERone (DEPO-PROVERA) injection 150 mg    Admin Date 03/27/2019 Action Given Dose 150 mg Route Intramuscular Administered By Tamela Oddi, RMA

## 2019-03-27 NOTE — Progress Notes (Addendum)
GYNECOLOGY OFFICE VISIT NOTE  History:  23 y.o. [id number] here today to restart depo. Has been on it before and likes it, no other concerns. Denies unprotected intercourse in the last two weeks and states she just completed a period. She denies any abnormal vaginal discharge, bleeding, pelvic pain or other concerns.  Also complains of sharp pain in her hands with "locking up." States this is new, has never had this issue before. Has not seen a PCP. Also complains of new onset pain at c-section scar, particularly when it is cold out. Has never had this issue before.   Desires STI screen, declines HIV/RPR/Hepatitis B& C.  Past Medical History:  Diagnosis Date  . Anxiety 2016  . Herpes simplex virus (HSV) infection   . VBAC, delivered     Past Surgical History:  Procedure Laterality Date  . CESAREAN SECTION  01/18/2016     Current Outpatient Medications:  .  clindamycin (CLEOCIN) 150 MG capsule, Take 3 capsules (450 mg total) by mouth 3 (three) times daily. (Patient not taking: Reported on 03/27/2019), Disp: 90 capsule, Rfl: 0 .  HYDROcodone-acetaminophen (NORCO/VICODIN) 5-325 MG tablet, Take 1 tablet by mouth every 6 (six) hours as needed for severe pain. (Patient not taking: Reported on 03/27/2019), Disp: 8 tablet, Rfl: 0 .  ibuprofen (ADVIL) 800 MG tablet, Take 1 tablet (800 mg total) by mouth 3 (three) times daily. (Patient not taking: Reported on 03/27/2019), Disp: 21 tablet, Rfl: 0 .  medroxyPROGESTERone (DEPO-PROVERA) 150 MG/ML injection, Inject 1 mL (150 mg total) into the muscle every 3 (three) months. (Patient not taking: Reported on 03/27/2019), Disp: 1 mL, Rfl: 4 .  medroxyPROGESTERone (DEPO-PROVERA) 150 MG/ML injection, Inject 1 mL (150 mg total) into the muscle every 3 (three) months., Disp: 1 mL, Rfl: 3 .  oxyCODONE-acetaminophen (PERCOCET/ROXICET) 5-325 MG tablet, Take 1 tablet by mouth every 6 (six) hours as needed for severe pain. (Patient not taking: Reported on 03/27/2019),  Disp: 6 tablet, Rfl: 0  The following portions of the patient's history were reviewed and updated as appropriate: allergies, current medications, past family history, past medical history, past social history, past surgical history and problem list.    Review of Systems:  Pertinent items noted in HPI and remainder of comprehensive ROS otherwise negative.   Objective:  Physical Exam BP 109/71   Pulse 77   Temp 98.7 F (37.1 C)   Ht 5\' 3"  (1.6 m)   Wt 187 lb 11.2 oz (85.1 kg)   LMP 03/09/2019 (Approximate)   BMI 33.25 kg/m  CONSTITUTIONAL: Well-developed, well-nourished female in no acute distress.  HENT:  Normocephalic, atraumatic. External right and left ear normal. Oropharynx is clear and moist EYES: Conjunctivae and EOM are normal. Pupils are equal, round, and reactive to light. No scleral icterus.  NECK: Normal range of motion, supple, no masses SKIN: Skin is warm and dry. No rash noted. Not diaphoretic. No erythema. No pallor. NEUROLOGIC: Alert and oriented to person, place, and time. Normal reflexes, muscle tone coordination. No cranial nerve deficit noted. PSYCHIATRIC: Normal mood and affect. Normal behavior. Normal judgment and thought content. CARDIOVASCULAR: Normal heart rate noted RESPIRATORY: Effort normal, no problems with respiration noted ABDOMEN: Soft, no distention noted.   PELVIC: Normal appearing external genitalia; normal appearing vaginal mucosa and cervix.  No abnormal discharge noted.  Pap smear obtained.  pelvic cultures obtained. Normal uterine size, no other palpable masses, no uterine or adnexal tenderness. MUSCULOSKELETAL: Normal range of motion. No edema noted.  Exam done  with chaperone present.  Labs and Imaging No results found.  Assessment & Plan:   1. Cervical cancer screening - Cytology - PAP( [facility]) - POCT urine pregnancy - recommended Gardisil, patient has not had, gave info, she will consider  2. Encounter for initial prescription  of injectable contraceptive Desires to restart depo, not interested in other methods Last unprotected intercourse > 2 weeks ago - POCT urine pregnancy  3. Pain in both hands Sounds nerve related, possible carpal tunnel although "locking up" is concerning - referred to primary care  4. Painful scar Unknown cause of new onset pain in c-section scar after 3 years, recommended warm compress to alleviate pain as needed, call if worsens   Routine preventative health maintenance measures emphasized. Please refer to After Visit Summary for other counseling recommendations.   Return in about 3 months (around 06/27/2019) for depo.   Total face-to-face time with patient: 15 minutes. Over 50% of encounter was spent on counseling and coordination of care.   Baldemar LenisK. Meryl , M.D. [facility]ending [facility](Faculty Practice)

## 2019-03-27 NOTE — Patient Instructions (Signed)
HPV Vaccine Information for Parents    HPV (human papillomavirus) is a common virus that spreads from person to person through sexual contact. It can spread during vaginal, anal, or oral sex. There are many types of HPV viruses, and some may cause cancer.  Your child can get a vaccination to prevent HPV infection and cancer. The vaccine is both safe and effective. It is recommended for boys and girls at about 11-22 years of age. Getting the vaccination at this age--before becoming sexually active--gives your child the best chance at protection from HPV infection through adulthood.  How can HPV affect my child?  HPV infection can cause:  · Genital warts.  · Mouth or throat cancer (oropharyngeal cancer).  · Anal cancer.  · Cervical, vulvar, or vaginal cancer.  · Penile cancer.  During pregnancy, HPV infection can be passed to the baby. This infection can cause warts to develop in a baby's throat and windpipe.  What actions can I take to lower my child's risk for HPV?  To lower your child's risk for HPV infection, have him or her get the HPV vaccination before becoming sexually active. The best time for vaccination is between ages 11 and 12, though it can be given to children as young as 9 years old. If your child gets the first dose before age 15, the vaccination can be given as 2 shots (doses), 6-12 months apart. In some situations, 3 doses are needed:  · If your child starts the vaccine before age 15 but does not have a second dose within 6-12 months, your child will need 3 doses to complete the vaccination. When your child has the first dose, it is important to make an appointment for the next shot and keep the appointment.  · Teens who are not vaccinated before age 15 will need 3 doses given within 6 months.  · If your child has a weak immune system, he or she may need 3 doses.  Young adults can also get the vaccination, even if they are already sexually active and even if they have already been infected with HPV.  The vaccination can still help prevent the types of cancer-causing HPV that a person has not been infected with.  What are the risks and benefits of the HPV vaccine?  Benefits  The main benefit of getting vaccinated is to prevent certain cancers, including:  · Cervical, vulvar, and vaginal cancer in females.  · Penile cancer in males.  · Oral and anal cancer in both males and females.  The risk of these cancers is lower if your child gets vaccinated before he or she becomes sexually active.  The vaccine also prevents genital warts caused by HPV.  Risks  The risks, although low, include side effects or reactions to the vaccine. Very few reactions have been reported, but they can include:  · Soreness, redness, or swelling at the injection site.  · Dizziness or headache.  · Fever.  Who should not get the HPV vaccine or should wait to get it?  Some children should not get the HPV vaccine or should wait. Discuss the risks and benefits of the vaccine with your child's health care provider if your child:  · Has had a severe allergic reaction to other vaccinations.  · Is allergic to yeast.  · Has a fever.  · Has had a recent illness.  · Is pregnant or may be pregnant.  Where to find more information  · Centers for Disease Control and   get a vaccination to prevent HPV infection and cancer. It is best to get the vaccination before becoming sexually active.  The HPV vaccine can protect your child from genital warts and certain types of cancer, including cancer of the cervix, throat, mouth, vulva, vagina, anus, and penis.  The HPV vaccine is both safe and effective.  The best time for boys and girls to get the vaccination is when they are between ages 11 and 12.  This information is not intended to replace advice given to you by your health care provider. Make sure you discuss any questions you have with your health care provider. Document Released: 10/12/2017 Document Revised: 12/11/2017 Document Reviewed: 10/12/2017 Elsevier Patient Education  2020 Elsevier Inc.  

## 2019-03-28 ENCOUNTER — Other Ambulatory Visit: Payer: Self-pay

## 2019-03-28 DIAGNOSIS — N898 Other specified noninflammatory disorders of vagina: Secondary | ICD-10-CM

## 2019-03-28 LAB — CYTOLOGY - PAP: Diagnosis: NEGATIVE

## 2019-03-29 ENCOUNTER — Other Ambulatory Visit: Payer: Self-pay | Admitting: Obstetrics and Gynecology

## 2019-03-29 LAB — CERVICOVAGINAL ANCILLARY ONLY
Bacterial vaginitis: POSITIVE — AB
Candida vaginitis: POSITIVE — AB
Chlamydia: NEGATIVE
Neisseria Gonorrhea: NEGATIVE
Trichomonas: NEGATIVE

## 2019-03-29 MED ORDER — FLUCONAZOLE 150 MG PO TABS: 150.0000 mg | ORAL_TABLET | Freq: Once | ORAL | 1 refills | Status: AC

## 2019-03-29 MED ORDER — METRONIDAZOLE 500 MG PO TABS: 500.0000 mg | ORAL_TABLET | Freq: Two times a day (BID) | ORAL | 0 refills | Status: DC

## 2019-03-29 NOTE — Progress Notes (Signed)
Script sent for yeast and BV

## 2019-04-02 ENCOUNTER — Encounter: Payer: Self-pay | Admitting: *Deleted

## 2019-04-20 ENCOUNTER — Encounter (HOSPITAL_COMMUNITY): Payer: Self-pay

## 2019-04-20 ENCOUNTER — Emergency Department (HOSPITAL_COMMUNITY)
Admission: EM | Admit: 2019-04-20 | Discharge: 2019-04-20 | Disposition: A | Discharge status: Home / Self Care | Payer: Medicaid Other | Attending: Emergency Medicine | Admitting: Emergency Medicine

## 2019-04-20 ENCOUNTER — Other Ambulatory Visit: Payer: Self-pay

## 2019-04-20 DIAGNOSIS — K029 Dental caries, unspecified: Secondary | ICD-10-CM | POA: Insufficient documentation

## 2019-04-20 DIAGNOSIS — N939 Abnormal uterine and vaginal bleeding, unspecified: Secondary | ICD-10-CM | POA: Diagnosis not present

## 2019-04-20 DIAGNOSIS — Z79899 Other long term (current) drug therapy: Secondary | ICD-10-CM | POA: Insufficient documentation

## 2019-04-20 DIAGNOSIS — K0889 Other specified disorders of teeth and supporting structures: Secondary | ICD-10-CM | POA: Diagnosis present

## 2019-04-20 MED ORDER — FLUCONAZOLE 200 MG PO TABS: 200.0000 mg | ORAL_TABLET | Freq: Every day | ORAL | 0 refills | Status: AC

## 2019-04-20 MED ORDER — MELOXICAM 7.5 MG PO TABS: 7.5000 mg | ORAL_TABLET | Freq: Every day | ORAL | 0 refills | Status: AC

## 2019-04-20 MED ORDER — PENICILLIN V POTASSIUM 500 MG PO TABS: 500.0000 mg | ORAL_TABLET | Freq: Four times a day (QID) | ORAL | 0 refills | Status: AC

## 2019-04-20 MED ORDER — KETOROLAC TROMETHAMINE 60 MG/2ML IM SOLN
30.0000 mg | Freq: Once | INTRAMUSCULAR | Status: AC
  Administered 2019-04-20: 12:00:00 30 mg via INTRAMUSCULAR
  Filled 2019-04-20: qty 2

## 2019-04-20 NOTE — Discharge Instructions (Addendum)
Take penicillin as prescribed and complete the full course. Antibiotics will sometimes give people a yeast infection, if you develop vaginal itching or thick discharge, take Diflucan as prescribed. Take meloxicam as needed as prescribed for pain. Rinse with Listerine or salt water after every meal. Follow-up with your dentist as scheduled on September 15. Contact your gynecologist office regarding ongoing vaginal bleeding.

## 2019-04-20 NOTE — ED Triage Notes (Signed)
Pt has been bleeding for 2 mths, takes Depo. Previously treated dental pain.

## 2019-04-20 NOTE — ED Notes (Signed)
Patient verbalizes understanding of discharge instructions. Opportunity for questioning and answers were provided. Armband removed by staff, pt discharged from ED.  

## 2019-04-20 NOTE — ED Provider Notes (Signed)
[facility] EMERGENCY DEPARTMENT Provider Note   CSN: [id number] Arrival date & time: 04/20/19  1100     History   Chief Complaint Chief Complaint  Patient presents with  . Vaginal Bleeding  . Dental Pain    HPI Phyllis Kidd is a 23 y.o. female.     23 year old female presents with complaint of dental pain.  Patient reports left lower dental cavity and incoming right lower wisdom tooth causing her mouth pain.  She denies fever, drainage, trauma.  Patient reports same tooth is causing the problem that she was seen for in June, is taking leftover clindamycin currently.  Patient is scheduled to see her dentist on September 15.  Patient is not taking anything for pain currently, states she is out of the Percocet given at last visit and requesting more.  Also reports ongoing vaginal spotting since getting her Depo shot 1 month ago.  Denies feeling weak, dizzy, short of breath.  No other complaints or concerns.     Past Medical History:  Diagnosis Date  . Anxiety 2016  . Herpes simplex virus (HSV) infection   . VBAC, delivered     Patient Active Problem List   Diagnosis Date Noted  . 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 CESAREAN SECTION  01/18/2016     OB History    Gravida  2   Para  2   Term  2   Preterm  0   AB  0   Living  2     SAB  0   TAB  0   Ectopic  0   Multiple  0   Live Births  2            Home Medications    Prior to Admission medications   Medication Sig Start Date End Date Taking? Authorizing Provider  fluconazole (DIFLUCAN) 200 MG tablet Take 1 tablet (200 mg total) by mouth daily for 1 day. Take if you develop yeast infection from antibiotic 04/20/19 04/21/19  Jeannie Fend, PA-C  medroxyPROGESTERone (DEPO-PROVERA) 150 MG/ML injection Inject 1 mL (150 mg total) into the muscle every 3 (three) months. Patient not taking: Reported  on 03/27/2019 02/16/18   Orvilla Cornwall A, CNM  medroxyPROGESTERone (DEPO-PROVERA) 150 MG/ML injection Inject 1 mL (150 mg total) into the muscle every 3 (three) months. 03/27/19   Conan Bowens, MD  meloxicam (MOBIC) 7.5 MG tablet Take 1 tablet (7.5 mg total) by mouth daily for 10 days. 04/20/19 04/30/19  Jeannie Fend, PA-C  metroNIDAZOLE (FLAGYL) 500 MG tablet Take 1 tablet (500 mg total) by mouth 2 (two) times daily. 03/29/19   Conan Bowens, MD  penicillin v potassium (VEETID) 500 MG tablet Take 1 tablet (500 mg total) by mouth 4 (four) times daily for 10 days. 04/20/19 04/30/19  Jeannie Fend, PA-C    Family History Family History  Family history unknown: Yes    Social History Social History   Tobacco Use  . Smoking status: Never Smoker  . Smokeless tobacco: Never Used  Substance Use Topics  . Alcohol use: No  . Drug use: No     Allergies   Patient has no known allergies.   Review of Systems Review of Systems  Constitutional: Negative for chills, diaphoresis and fever.  HENT: Positive for dental problem. Negative for ear pain, facial swelling, trouble swallowing and voice change.  Respiratory: Negative for shortness of breath.   Genitourinary: Positive for vaginal bleeding.  Skin: Negative for rash and wound.  Allergic/Immunologic: Negative for immunocompromised state.  Neurological: Negative for dizziness, weakness and light-headedness.  Hematological: Negative for adenopathy.  All other systems reviewed and are negative.    Physical Exam Updated Vital Signs BP 113/77   Pulse 61   Temp 98.7 F (37.1 C) (Oral)   Resp 20   LMP 03/09/2019   SpO2 100%   Physical Exam Vitals signs and nursing note reviewed.  Constitutional:      General: She is not in acute distress.    Appearance: She is well-developed. She is not diaphoretic.  HENT:     Head: Normocephalic and atraumatic.     Mouth/Throat:     Mouth: Mucous membranes are moist.     Dentition: Abnormal  dentition. Dental caries present. No gingival swelling or dental abscesses.     Palate: No mass and lesions.     Pharynx: Oropharynx is clear. Uvula midline. No oropharyngeal exudate or posterior oropharyngeal erythema.   Neck:     Musculoskeletal: Neck supple.  Cardiovascular:     Rate and Rhythm: Normal rate and regular rhythm.     Pulses: Normal pulses.     Heart sounds: Normal heart sounds.  Pulmonary:     Effort: Pulmonary effort is normal.     Breath sounds: Normal breath sounds.  Abdominal:     Palpations: Abdomen is soft.     Tenderness: There is no abdominal tenderness.  Lymphadenopathy:     Cervical: No cervical adenopathy.  Skin:    General: Skin is warm and dry.     Findings: No erythema or rash.  Neurological:     Mental Status: She is alert and oriented to person, place, and time.  Psychiatric:        Behavior: Behavior normal.      ED Treatments / Results  Labs (all labs ordered are listed, but only abnormal results are displayed) Labs Reviewed - No data to display  EKG None  Radiology No results found.  Procedures Procedures (including critical care time)  Medications Ordered in ED Medications  ketorolac (TORADOL) injection 30 mg (30 mg Intramuscular Given 04/20/19 1226)     Initial Impression / Assessment and Plan / ED Course  I have reviewed the triage vital signs and the nursing notes.  Pertinent labs & imaging results that were available during my care of the patient were reviewed by me and considered in my medical decision making (see chart for details).  Clinical Course as of Apr 19 1236  Sat Apr 20, 2019  [id number] 23 year old female with dental pain and vaginal bleeding.  Patient reports dental pain ongoing for several months, no trauma, no fevers, no drainage.  Patient is taking leftover clindamycin, is scheduled to see her dentist in 3 days.  On exam no trismus, no cervical adenopathy, no swelling.  Patient does have a large cavity to left  lower tooth does not appear to have abscess.  Patient requesting refill of her Percocet from her previous visit.  Advised patient narcotics would not be given however offered injection of Toradol, given lollicaine as well as prescription for meloxicam to take.  Patient does not have full course of clindamycin to take, advised to discontinue use and start penicillin. Regarding patient's vaginal bleeding, reports as persistent spotting for the past month since receiving a Depo shot.  Vital signs are stable, no symptoms to suggest anemia,  abdomen is soft and nontender.  Recommend patient follow-up with her gynecologist for abnormal bleeding.   [LM]    Clinical Course User Index [LM] Tacy Learn, PA-C      Final Clinical Impressions(s) / ED Diagnoses   Final diagnoses:  Pain due to dental caries  Vaginal bleeding    ED Discharge Orders         Ordered    penicillin v potassium (VEETID) 500 MG tablet  4 times daily     04/20/19 1213    meloxicam (MOBIC) 7.5 MG tablet  Daily     04/20/19 1213    fluconazole (DIFLUCAN) 200 MG tablet  Daily     04/20/19 1213           Roque Lias 04/20/19 1237    Malvin Johns, MD 04/20/19 1550

## 2019-04-22 ENCOUNTER — Ambulatory Visit: Payer: Medicaid Other | Admitting: Obstetrics and Gynecology

## 2019-05-16 ENCOUNTER — Emergency Department (HOSPITAL_COMMUNITY)
Admission: EM | Admit: 2019-05-16 | Discharge: 2019-05-16 | Disposition: A | Discharge status: Home / Self Care | Payer: Medicaid Other | Attending: Emergency Medicine | Admitting: Emergency Medicine

## 2019-05-16 ENCOUNTER — Encounter (HOSPITAL_COMMUNITY): Payer: Self-pay | Admitting: Emergency Medicine

## 2019-05-16 ENCOUNTER — Other Ambulatory Visit: Payer: Self-pay

## 2019-05-16 DIAGNOSIS — R000 Tachycardia, unspecified: Secondary | ICD-10-CM | POA: Diagnosis not present

## 2019-05-16 DIAGNOSIS — R079 Chest pain, unspecified: Secondary | ICD-10-CM | POA: Diagnosis not present

## 2019-05-16 DIAGNOSIS — K0889 Other specified disorders of teeth and supporting structures: Secondary | ICD-10-CM | POA: Insufficient documentation

## 2019-05-16 DIAGNOSIS — R110 Nausea: Secondary | ICD-10-CM | POA: Insufficient documentation

## 2019-05-16 DIAGNOSIS — K029 Dental caries, unspecified: Secondary | ICD-10-CM | POA: Insufficient documentation

## 2019-05-16 DIAGNOSIS — R52 Pain, unspecified: Secondary | ICD-10-CM | POA: Diagnosis not present

## 2019-05-16 DIAGNOSIS — I10 Essential (primary) hypertension: Secondary | ICD-10-CM | POA: Diagnosis not present

## 2019-05-16 LAB — CBC
HCT: 39 % (ref 36.0–46.0)
Hemoglobin: 12.5 g/dL (ref 12.0–15.0)
MCH: 27.7 pg (ref 26.0–34.0)
MCHC: 32.1 g/dL (ref 30.0–36.0)
MCV: 86.3 fL (ref 80.0–100.0)
Platelets: 422 10*3/uL — ABNORMAL HIGH (ref 150–400)
RBC: 4.52 MIL/uL (ref 3.87–5.11)
RDW: 14.1 % (ref 11.5–15.5)
WBC: 8 10*3/uL (ref 4.0–10.5)
nRBC: 0 % (ref 0.0–0.2)

## 2019-05-16 LAB — COMPREHENSIVE METABOLIC PANEL
ALT: 14 U/L (ref 0–44)
AST: 17 U/L (ref 15–41)
Albumin: 4.2 g/dL (ref 3.5–5.0)
Alkaline Phosphatase: 46 U/L (ref 38–126)
Anion gap: 9 (ref 5–15)
BUN: 6 mg/dL (ref 6–20)
CO2: 24 mmol/L (ref 22–32)
Calcium: 9.3 mg/dL (ref 8.9–10.3)
Chloride: 105 mmol/L (ref 98–111)
Creatinine, Ser: 0.96 mg/dL (ref 0.44–1.00)
GFR calc Af Amer: >60 mL/min (ref >60)
GFR calc non Af Amer: >60 mL/min (ref >60)
Glucose, Bld: 87 mg/dL (ref 70–99)
Potassium: 3.6 mmol/L (ref 3.5–5.1)
Sodium: 138 mmol/L (ref 135–145)
Total Bilirubin: 0.4 mg/dL (ref 0.3–1.2)
Total Protein: 6.8 g/dL (ref 6.5–8.1)

## 2019-05-16 LAB — I-STAT BETA HCG BLOOD, ED (MC, WL, AP ONLY): I-stat hCG, quantitative: <5 m[IU]/mL (ref <5)

## 2019-05-16 LAB — LIPASE, BLOOD: Lipase: 30 U/L (ref 11–51)

## 2019-05-16 MED ORDER — KETOROLAC TROMETHAMINE 30 MG/ML IJ SOLN
30.0000 mg | Freq: Once | INTRAMUSCULAR | Status: AC
  Administered 2019-05-16: 18:00:00 30 mg via INTRAMUSCULAR
  Filled 2019-05-16: qty 1

## 2019-05-16 MED ORDER — ONDANSETRON 4 MG PO TBDP: 4.0000 mg | ORAL_TABLET | Freq: Three times a day (TID) | ORAL | 0 refills | Status: DC | PRN

## 2019-05-16 MED ORDER — SODIUM CHLORIDE 0.9% FLUSH: 3.0000 mL | Freq: Once | INTRAVENOUS | Status: DC

## 2019-05-16 MED ORDER — MELOXICAM 7.5 MG PO TABS: 7.5000 mg | ORAL_TABLET | Freq: Every day | ORAL | 0 refills | Status: AC

## 2019-05-16 MED ORDER — ONDANSETRON 4 MG PO TBDP
4.0000 mg | ORAL_TABLET | Freq: Once | ORAL | Status: AC
  Administered 2019-05-16: 18:00:00 4 mg via ORAL
  Filled 2019-05-16: qty 1

## 2019-05-16 MED ORDER — AMOXICILLIN-POT CLAVULANATE 875-125 MG PO TABS: 1.0000 | ORAL_TABLET | Freq: Two times a day (BID) | ORAL | 0 refills | Status: DC

## 2019-05-16 NOTE — ED Triage Notes (Addendum)
Pt BIB [facility] from home, c/o bilateral lower dental pain, scheduled to have teeth removed in 1 week. Reports nausea/vomiting that started yesterday. Also c/o ear pain and headache. Given 29mcg fentanyl IN, with mild relief.

## 2019-05-16 NOTE — ED Provider Notes (Signed)
[place] EMERGENCY DEPARTMENT Provider Note   CSN: [id number] Arrival date & time: 05/16/19  1517     History   Chief Complaint Chief Complaint  Patient presents with  . Dental Pain  . Emesis    HPI Phyllis Kidd is a 23 y.o. female with a past medical history of anxiety who presents today for evaluation of dental pain.  She was given 50 MCG's of fentanyl by EMS.  Chart review shows that she has been seen multiple times in the past for her dental infections of the right sided mandibular second and third molars.  She reports that she has a dentist appointment on the 15th to get these teeth fixed and removed.  Chart review shows that she has also reported that on previous visits.  She denies any fevers.  She took 200 mg of ibuprofen this morning, approximately 8 hours prior to arrival.  She states that ibuprofen and Tylenol never helped her pain so she does not take them.       HPI  Past Medical History:  Diagnosis Date  . Anxiety 2016  . Herpes simplex virus (HSV) infection   . VBAC, delivered     Patient Active Problem List   Diagnosis Date Noted  . 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 CESAREAN SECTION  01/18/2016     OB History    Gravida  2   Para  2   Term  2   Preterm  0   AB  0   Living  2     SAB  0   TAB  0   Ectopic  0   Multiple  0   Live Births  2            Home Medications    Prior to Admission medications   Medication Sig Start Date End Date Taking? Authorizing Provider  amoxicillin-clavulanate (AUGMENTIN) 875-125 MG tablet Take 1 tablet by mouth every 12 (twelve) hours. 05/16/19   Lorin Glass, PA-C  medroxyPROGESTERone (DEPO-PROVERA) 150 MG/ML injection Inject 1 mL (150 mg total) into the muscle every 3 (three) months. Patient not taking: Reported on 03/27/2019 02/16/18   Kandis Cocking A, CNM  medroxyPROGESTERone  (DEPO-PROVERA) 150 MG/ML injection Inject 1 mL (150 mg total) into the muscle every 3 (three) months. 03/27/19   Sloan Leiter, MD  meloxicam (MOBIC) 7.5 MG tablet Take 1 tablet (7.5 mg total) by mouth daily for 10 days. 05/16/19 05/26/19  Lorin Glass, PA-C  metroNIDAZOLE (FLAGYL) 500 MG tablet Take 1 tablet (500 mg total) by mouth 2 (two) times daily. 03/29/19   Sloan Leiter, MD  ondansetron (ZOFRAN ODT) 4 MG disintegrating tablet Take 1 tablet (4 mg total) by mouth every 8 (eight) hours as needed for nausea or vomiting. 05/16/19   Lorin Glass, PA-C    Family History Family History  Family history unknown: Yes    Social History Social History   Tobacco Use  . Smoking status: Never Smoker  . Smokeless tobacco: Never Used  Substance Use Topics  . Alcohol use: No  . Drug use: No     Allergies   Patient has no known allergies.   Review of Systems Review of Systems  Constitutional: Negative for chills and fever.  HENT: Positive for dental problem. Negative for drooling, sinus pressure, sore throat, trouble swallowing and voice change.   Respiratory: Negative  for cough and shortness of breath.   Cardiovascular: Negative for chest pain.  Gastrointestinal: Positive for nausea. Negative for abdominal pain.  Musculoskeletal: Negative for back pain.  Skin: Negative for color change.  Neurological: Negative for dizziness and weakness.  All other systems reviewed and are negative.    Physical Exam Updated Vital Signs BP 132/69   Pulse 82   Temp 99.6 F (37.6 C) (Oral)   Resp 16   SpO2 100%   Physical Exam Vitals signs and nursing note reviewed.  Constitutional:      General: She is not in acute distress.    Appearance: She is well-developed. She is not diaphoretic.  HENT:     Head: Normocephalic and atraumatic.     Right Ear: Tympanic membrane, ear canal and external ear normal. There is no impacted cerumen.     Left Ear: Tympanic membrane, ear canal and  external ear normal. There is no impacted cerumen.     Nose: Nose normal.     Mouth/Throat:     Mouth: Mucous membranes are moist.     Dentition: Abnormal dentition. Does not have dentures. Dental tenderness and dental caries present. No gingival swelling or dental abscesses.     Palate: Mass present.     Pharynx: Oropharynx is clear. Uvula midline. No oropharyngeal exudate.     Tonsils: No tonsillar exudate or tonsillar abscesses.   Eyes:     General: No scleral icterus.       Right eye: No discharge.        Left eye: No discharge.     Conjunctiva/sclera: Conjunctivae normal.  Neck:     Musculoskeletal: Full passive range of motion without pain, normal range of motion and neck supple.     Trachea: Phonation normal.     Comments: No submandibular swelling.  No elevation of the floor the mouth. Cardiovascular:     Rate and Rhythm: Normal rate and regular rhythm.  Pulmonary:     Effort: Pulmonary effort is normal. No respiratory distress.     Breath sounds: No stridor.  Abdominal:     General: There is no distension.     Tenderness: There is no abdominal tenderness.  Musculoskeletal:        General: No deformity.  Lymphadenopathy:     Cervical: No cervical adenopathy.  Skin:    General: Skin is warm and dry.  Neurological:     General: No focal deficit present.     Mental Status: She is alert.     Motor: No abnormal muscle tone.  Psychiatric:        Mood and Affect: Mood normal.        Behavior: Behavior normal.      ED Treatments / Results  Labs (all labs ordered are listed, but only abnormal results are displayed) Labs Reviewed  CBC - Abnormal; Notable for the following components:      Result Value   Platelets 422 (*)    All other components within normal limits  LIPASE, BLOOD  COMPREHENSIVE METABOLIC PANEL  I-STAT BETA HCG BLOOD, ED ([facility], [facility], [facility] ONLY)    EKG None  Radiology No results found.  Procedures Procedures (including critical care time)   Medications Ordered in ED Medications  ketorolac (TORADOL) 30 MG/ML injection 30 mg (30 mg Intramuscular Given 05/16/19 1829)  ondansetron (ZOFRAN-ODT) disintegrating tablet 4 mg (4 mg Oral Given 05/16/19 1829)     Initial Impression / Assessment and Plan / ED Course  I  have reviewed the triage vital signs and the nursing notes.  Pertinent labs & imaging results that were available during my care of the patient were reviewed by me and considered in my medical decision making (see chart for details).       Patient with toothache.  No gross abscess.  Exam unconcerning for Ludwig's angina or spread of infection.  Will treat with amoxicillin and pain medicine.  Per her request she was given a shot of Toradol here.  She is given prescriptions for meloxicam and Zofran in addition to her antibiotic.  We discussed the importance of dental follow-up given that this is a recurrent issue and she states her understanding.   Return precautions were discussed with patient who states their understanding.  At the time of discharge patient denied any unaddressed complaints or concerns.  Patient is agreeable for discharge home.   Final Clinical Impressions(s) / ED Diagnoses   Final diagnoses:  Pain, dental    ED Discharge Orders         Ordered    meloxicam (MOBIC) 7.5 MG tablet  Daily     05/16/19 1816    ondansetron (ZOFRAN ODT) 4 MG disintegrating tablet  Every 8 hours PRN     05/16/19 1816    amoxicillin-clavulanate (AUGMENTIN) 875-125 MG tablet  Every 12 hours     05/16/19 1816           Cristina Gong, Cordelia Poche 05/16/19 2228    Tegeler, Canary Brim, MD 05/17/19 0000

## 2019-05-16 NOTE — Discharge Instructions (Addendum)
Today you are given a shot of Toradol.  Please do not take any ibuprofen, Motrin, Aleve, naproxen, meloxicam, Mobic, Voltaren or other NSAID medications for 8 hours.  You have been given a prescription for meloxicam.  Please do not take any ibuprofen, Motrin, Aleve, naproxen, Naprosyn or other NSAIDs while you are taking this. You may use Tylenol to help with your pain as directed below. Please call your dentist to see if they can see you sooner.  You may have diarrhea from the antibiotics.  It is very important that you continue to take the antibiotics even if you get diarrhea unless a medical professional tells you that you may stop taking them.  If you stop too early the bacteria you are being treated for will become stronger and you may need different, more powerful antibiotics that have more side effects and worsening diarrhea.  Please stay well hydrated and consider probiotics as they may decrease the severity of your diarrhea.  Please be aware that if you take any hormonal contraception (birth control pills, nexplanon, the ring, etc) that your birth control will not work while you are taking antibiotics and you need to use back up protection as directed on the birth control medication information insert.   Please take Tylenol (acetaminophen) to relieve your pain.  You may take tylenol, up to 1,000 mg (two extra strength pills).  Do not take more than 3,000 mg tylenol in a 24 hour period.  Please check all medication labels as many medications such as pain and cold medications may contain tylenol. Please do not drink alcohol while taking this medication.   EMS gave you medications that may make you sleepy.  Please do not drive, operate heavy machinery, or perform other potentially dangerous tasks for the next 24 hours.

## 2019-05-23 DIAGNOSIS — K010 Embedded teeth: Secondary | ICD-10-CM | POA: Diagnosis not present

## 2019-06-12 ENCOUNTER — Other Ambulatory Visit: Payer: Self-pay | Admitting: Obstetrics and Gynecology

## 2019-06-12 MED ORDER — MEDROXYPROGESTERONE ACETATE 150 MG/ML IM SUSP: 150.0000 mg | INTRAMUSCULAR | 3 refills | Status: DC

## 2019-06-17 ENCOUNTER — Ambulatory Visit: Payer: Medicaid Other

## 2019-06-18 ENCOUNTER — Telehealth: Payer: Self-pay | Admitting: Obstetrics

## 2019-09-18 ENCOUNTER — Emergency Department (HOSPITAL_COMMUNITY): Payer: Medicaid Other

## 2019-09-18 ENCOUNTER — Other Ambulatory Visit: Payer: Self-pay

## 2019-09-18 ENCOUNTER — Encounter (HOSPITAL_COMMUNITY): Payer: Self-pay | Admitting: Emergency Medicine

## 2019-09-18 ENCOUNTER — Emergency Department (HOSPITAL_COMMUNITY)
Admission: EM | Admit: 2019-09-18 | Discharge: 2019-09-18 | Disposition: A | Discharge status: Home / Self Care | Payer: Medicaid Other | Attending: Emergency Medicine | Admitting: Emergency Medicine

## 2019-09-18 DIAGNOSIS — S99922A Unspecified injury of left foot, initial encounter: Secondary | ICD-10-CM | POA: Diagnosis not present

## 2019-09-18 DIAGNOSIS — M79675 Pain in left toe(s): Secondary | ICD-10-CM | POA: Diagnosis not present

## 2019-09-18 DIAGNOSIS — Z793 Long term (current) use of hormonal contraceptives: Secondary | ICD-10-CM | POA: Insufficient documentation

## 2019-09-18 DIAGNOSIS — Z79899 Other long term (current) drug therapy: Secondary | ICD-10-CM | POA: Diagnosis not present

## 2019-09-18 DIAGNOSIS — R1084 Generalized abdominal pain: Secondary | ICD-10-CM

## 2019-09-18 LAB — POC URINE PREG, ED: Preg Test, Ur: NEGATIVE

## 2019-09-18 LAB — COMPREHENSIVE METABOLIC PANEL
ALT: 15 U/L (ref 0–44)
AST: 18 U/L (ref 15–41)
Albumin: 3.6 g/dL (ref 3.5–5.0)
Alkaline Phosphatase: 46 U/L (ref 38–126)
Anion gap: 11 (ref 5–15)
BUN: 13 mg/dL (ref 6–20)
CO2: 23 mmol/L (ref 22–32)
Calcium: 9.4 mg/dL (ref 8.9–10.3)
Chloride: 107 mmol/L (ref 98–111)
Creatinine, Ser: 0.88 mg/dL (ref 0.44–1.00)
GFR calc Af Amer: >60 mL/min (ref >60)
GFR calc non Af Amer: >60 mL/min (ref >60)
Glucose, Bld: 93 mg/dL (ref 70–99)
Potassium: 4 mmol/L (ref 3.5–5.1)
Sodium: 141 mmol/L (ref 135–145)
Total Bilirubin: 0.6 mg/dL (ref 0.3–1.2)
Total Protein: 6.1 g/dL — ABNORMAL LOW (ref 6.5–8.1)

## 2019-09-18 LAB — LIPASE, BLOOD: Lipase: 33 U/L (ref 11–51)

## 2019-09-18 LAB — URINALYSIS, ROUTINE W REFLEX MICROSCOPIC
Bilirubin Urine: NEGATIVE
Glucose, UA: NEGATIVE mg/dL
Hgb urine dipstick: NEGATIVE
Ketones, ur: NEGATIVE mg/dL
Leukocytes,Ua: NEGATIVE
Nitrite: NEGATIVE
Protein, ur: NEGATIVE mg/dL
Specific Gravity, Urine: 1.026 (ref 1.005–1.030)
pH: 5 (ref 5.0–8.0)

## 2019-09-18 LAB — CBC
HCT: 39.4 % (ref 36.0–46.0)
Hemoglobin: 12.9 g/dL (ref 12.0–15.0)
MCH: 27.8 pg (ref 26.0–34.0)
MCHC: 32.7 g/dL (ref 30.0–36.0)
MCV: 84.9 fL (ref 80.0–100.0)
Platelets: 316 10*3/uL (ref 150–400)
RBC: 4.64 MIL/uL (ref 3.87–5.11)
RDW: 13.4 % (ref 11.5–15.5)
WBC: 7.7 10*3/uL (ref 4.0–10.5)
nRBC: 0 % (ref 0.0–0.2)

## 2019-09-18 MED ORDER — SODIUM CHLORIDE 0.9% FLUSH: 3.0000 mL | Freq: Once | INTRAVENOUS | Status: DC

## 2019-09-18 MED ORDER — DICYCLOMINE HCL 20 MG PO TABS: 20.0000 mg | ORAL_TABLET | Freq: Two times a day (BID) | ORAL | 0 refills | Status: DC

## 2019-09-18 NOTE — ED Triage Notes (Signed)
Pt states for 1 month she has had mid abd pain with burning. denies n/v/d. Pt also states today she stumped her left big toe.

## 2019-09-18 NOTE — Discharge Instructions (Signed)
As discussed, all of your labs looked good today.  I am sending you home with a medication for abdominal pain.  You can take it twice a day as needed.  You may also take over-the-counter ibuprofen or Tylenol.  I have included the number for [facility].  If her symptoms do not improve within the next week call to schedule appointment for further evaluation.  Again your left toe is not broken.  Continue to ice it and elevate it.  Return to the ER for new or worsening symptoms.

## 2019-09-18 NOTE — ED Provider Notes (Signed)
[facility] EMERGENCY DEPARTMENT Provider Note   CSN: [id number] Arrival date & time: 09/18/19  1438     History Chief Complaint  Patient presents with  . Abdominal Pain    Phyllis Kidd is a 24 y.o. female with a past medical history significant for anxiety and herpes simplex virus who presents to the ED due to mid and upper abdominal pain x1 month. Patient describes pain as sharp in sensation and describes them as "period cramps". Patient states pain is worse with movement. Patient denies associative nausea, vomiting, or diarrhea. She notes the pain waxes and wanes throughout the day. She is currently on Depo injections for birth control and has not had a menstrual cycle over the past 4 months. She admits to being sexually active with one female without protection. Patient admits to one previous C-section, but denies all other abdominal operations. He also admits to reflux symptoms over the past few weeks. She also notes she stubbed her left great toe 2 days ago which is caused worsening pain with movement. Patient denies fever and chills. Patient denies vaginal and urinary symptoms.    Past Medical History:  Diagnosis Date  . Anxiety 2016  . Herpes simplex virus (HSV) infection   . VBAC, delivered     Patient Active Problem List   Diagnosis Date Noted  . 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 CESAREAN SECTION  01/18/2016     OB History    Gravida  2   Para  2   Term  2   Preterm  0   AB  0   Living  2     SAB  0   TAB  0   Ectopic  0   Multiple  0   Live Births  2           Family History  Family history unknown: Yes    Social History   Tobacco Use  . Smoking status: Never Smoker  . Smokeless tobacco: Never Used  Substance Use Topics  . Alcohol use: No  . Drug use: No    Home Medications Prior to Admission medications   Medication Sig Start  Date End Date Taking? Authorizing Provider  amoxicillin-clavulanate (AUGMENTIN) 875-125 MG tablet Take 1 tablet by mouth every 12 (twelve) hours. 05/16/19   Lorin Glass, PA-C  dicyclomine (BENTYL) 20 MG tablet Take 1 tablet (20 mg total) by mouth 2 (two) times daily. 09/18/19   Suzy Bouchard, PA-C  medroxyPROGESTERone (DEPO-PROVERA) 150 MG/ML injection Inject 1 mL (150 mg total) into the muscle every 3 (three) months. Patient not taking: Reported on 03/27/2019 02/16/18   Kandis Cocking A, CNM  medroxyPROGESTERone (DEPO-PROVERA) 150 MG/ML injection Inject 1 mL (150 mg total) into the muscle every 3 (three) months. 06/12/19   Sloan Leiter, MD  metroNIDAZOLE (FLAGYL) 500 MG tablet Take 1 tablet (500 mg total) by mouth 2 (two) times daily. 03/29/19   Sloan Leiter, MD  ondansetron (ZOFRAN ODT) 4 MG disintegrating tablet Take 1 tablet (4 mg total) by mouth every 8 (eight) hours as needed for nausea or vomiting. 05/16/19   Lorin Glass, PA-C    Allergies    Patient has no known allergies.  Review of Systems   Review of Systems  Constitutional: Negative for chills and fever.  Respiratory: Negative for shortness of breath.   Cardiovascular: Negative for chest pain.  Gastrointestinal: Positive for abdominal pain. Negative for abdominal distention, diarrhea, nausea and vomiting.  Genitourinary: Negative for dysuria, vaginal bleeding and vaginal discharge.  Musculoskeletal: Negative for back pain.    Physical Exam Updated Vital Signs BP 129/79 (BP Location: Right Arm)   Pulse 85   Temp 98.3 F (36.8 C) (Oral)   Resp 16   SpO2 100%   Physical Exam Vitals and nursing note reviewed.  Constitutional:      General: She is not in acute distress.    Appearance: She is not ill-appearing.     Comments: Well-appearing 24 year old.  HENT:     Head: Normocephalic.  Eyes:     Pupils: Pupils are equal, round, and reactive to light.  Cardiovascular:     Rate and Rhythm: Normal  rate and regular rhythm.     Pulses: Normal pulses.     Heart sounds: Normal heart sounds. No murmur. No friction rub. No gallop.   Pulmonary:     Effort: Pulmonary effort is normal.     Breath sounds: Normal breath sounds.  Abdominal:     General: Abdomen is flat. Bowel sounds are normal. There is no distension.     Palpations: Abdomen is soft.     Tenderness: There is no abdominal tenderness. There is no guarding or rebound.     Comments: Abdomen soft, nondistended, nontender to palpation in all quadrants without guarding or peritoneal signs. No rebound.   Musculoskeletal:     Cervical back: Neck supple.     Comments: Tenderness palpation over left great toe with limited range of motion due to pain. Mild overlying erythema. Both lower extremities neurovascularly intact. Full range of motion of the other toes and left ankle and left knee. No lower extremity edema. No overlying warmth or edema.  Skin:    General: Skin is warm and dry.  Neurological:     General: No focal deficit present.     Mental Status: She is alert.  Psychiatric:        Mood and Affect: Mood normal.        Behavior: Behavior normal.     ED Results / Procedures / Treatments   Labs (all labs ordered are listed, but only abnormal results are displayed) Labs Reviewed  COMPREHENSIVE METABOLIC PANEL - Abnormal; Notable for the following components:      Result Value   Total Protein 6.1 (*)    All other components within normal limits  LIPASE, BLOOD  CBC  URINALYSIS, ROUTINE W REFLEX MICROSCOPIC  POC URINE PREG, ED    EKG None  Radiology DG Toe Great Left  Result Date: 09/18/2019 CLINICAL DATA:  Left great toe injury today EXAM: LEFT GREAT TOE COMPARISON:  None. FINDINGS: Frontal, oblique, lateral views of the first digit left foot are obtained. No acute displaced fracture. Alignment is anatomic. Joint spaces are well preserved. Soft tissues are unremarkable. IMPRESSION: 1. Unremarkable left great toe.  Electronically Signed   By: Sharlet Salina M.D.   On: 09/18/2019 16:06    Procedures Procedures (including critical care time)  Medications Ordered in ED Medications  sodium chloride flush (NS) 0.9 % injection 3 mL (3 mLs Intravenous Not Given 09/18/19 1606)    ED Course  I have reviewed the triage vital signs and the nursing notes.  Pertinent labs & imaging results that were available during my care of the patient were reviewed by me and considered in my medical decision making (see chart for details).    MDM  Rules/Calculators/A&P                     24 year old female presents to the ED due to multiple complaints. She admits to having abdominal pain around her umbilicus and epigastric region x1 month. Denies associative nausea, vomiting, diarrhea. Denies vaginal and urinary symptoms. She also notes that she stubbed her left great toe 2 days ago and has had progressively worsening pain. Vitals all within normal limits. Patient is afebrile, not tachycardic or hypoxic. Patient in no acute distress and rather well-appearing. Physical exam reassuring. Abdomen soft, nondistended, nontender. No concern for surgical abdomen at this time. Negative CVA tenderness bilaterally. Will obtain routine abdominal labs in addition to UA. Will also order left great toe x-ray to rule out bony fracture.   CBC unremarkable with no leukocytosis.  Lipase normal at 33.  Doubt pancreatitis.  Urine pregnancy negative.  No concern for pregnancy related abdominal pain.  CMP reassuring with normal renal function and no electrolyte abnormalities.  UA negative for signs of infection.  Left great toe x-ray personally reviewed which is negative for signs of bony fractures.  Will discharge patient with Bentyl.  Advised patient to take over-the-counter Tylenol or ibuprofen as needed for abdominal pain and left great toe pain. Patient advised to follow-up with PCP if symptoms do not improve within the next week. Strict ED  precautions discussed with patient. Patient states understanding and agrees to plan. Patient discharged home in no acute distress and stable vitals.  Final Clinical Impression(s) / ED Diagnoses Final diagnoses:  Generalized abdominal pain  Great toe pain, left    Rx / DC Orders ED Discharge Orders         Ordered    dicyclomine (BENTYL) 20 MG tablet  2 times daily     09/18/19 1816           Jesusita Oka 09/18/19 Brooke Pace    Arby Barrette, MD 09/20/19 1531

## 2019-12-12 ENCOUNTER — Encounter (HOSPITAL_COMMUNITY): Payer: Self-pay | Admitting: Emergency Medicine

## 2019-12-12 ENCOUNTER — Other Ambulatory Visit: Payer: Self-pay

## 2019-12-12 ENCOUNTER — Emergency Department (HOSPITAL_COMMUNITY)
Admission: EM | Admit: 2019-12-12 | Discharge: 2019-12-12 | Disposition: A | Discharge status: Home / Self Care | Payer: Medicaid Other | Attending: Emergency Medicine | Admitting: Emergency Medicine

## 2019-12-12 DIAGNOSIS — M79604 Pain in right leg: Secondary | ICD-10-CM | POA: Diagnosis not present

## 2019-12-12 DIAGNOSIS — M79605 Pain in left leg: Secondary | ICD-10-CM | POA: Insufficient documentation

## 2019-12-12 DIAGNOSIS — Z5321 Procedure and treatment not carried out due to patient leaving prior to being seen by health care provider: Secondary | ICD-10-CM | POA: Insufficient documentation

## 2019-12-12 DIAGNOSIS — M79671 Pain in right foot: Secondary | ICD-10-CM | POA: Diagnosis not present

## 2019-12-12 DIAGNOSIS — R109 Unspecified abdominal pain: Secondary | ICD-10-CM | POA: Diagnosis not present

## 2019-12-12 DIAGNOSIS — M79672 Pain in left foot: Secondary | ICD-10-CM | POA: Insufficient documentation

## 2019-12-12 LAB — URINALYSIS, ROUTINE W REFLEX MICROSCOPIC
Bilirubin Urine: NEGATIVE
Glucose, UA: NEGATIVE mg/dL
Hgb urine dipstick: NEGATIVE
Ketones, ur: NEGATIVE mg/dL
Leukocytes,Ua: NEGATIVE
Nitrite: NEGATIVE
Protein, ur: NEGATIVE mg/dL
Specific Gravity, Urine: 1.028 (ref 1.005–1.030)
pH: 7 (ref 5.0–8.0)

## 2019-12-12 LAB — I-STAT BETA HCG BLOOD, ED (MC, WL, AP ONLY): I-stat hCG, quantitative: <5 m[IU]/mL (ref <5)

## 2019-12-12 LAB — COMPREHENSIVE METABOLIC PANEL
ALT: 16 U/L (ref 0–44)
AST: 27 U/L (ref 15–41)
Albumin: 3.6 g/dL (ref 3.5–5.0)
Alkaline Phosphatase: 54 U/L (ref 38–126)
Anion gap: 9 (ref 5–15)
BUN: 11 mg/dL (ref 6–20)
CO2: 23 mmol/L (ref 22–32)
Calcium: 8.8 mg/dL — ABNORMAL LOW (ref 8.9–10.3)
Chloride: 108 mmol/L (ref 98–111)
Creatinine, Ser: 1.03 mg/dL — ABNORMAL HIGH (ref 0.44–1.00)
GFR calc Af Amer: >60 mL/min (ref >60)
GFR calc non Af Amer: >60 mL/min (ref >60)
Glucose, Bld: 92 mg/dL (ref 70–99)
Potassium: 3.9 mmol/L (ref 3.5–5.1)
Sodium: 140 mmol/L (ref 135–145)
Total Bilirubin: 0.3 mg/dL (ref 0.3–1.2)
Total Protein: 6.3 g/dL — ABNORMAL LOW (ref 6.5–8.1)

## 2019-12-12 LAB — LIPASE, BLOOD: Lipase: 36 U/L (ref 11–51)

## 2019-12-12 LAB — CBC
HCT: 39.6 % (ref 36.0–46.0)
Hemoglobin: 12.6 g/dL (ref 12.0–15.0)
MCH: 28.2 pg (ref 26.0–34.0)
MCHC: 31.8 g/dL (ref 30.0–36.0)
MCV: 88.6 fL (ref 80.0–100.0)
Platelets: 378 10*3/uL (ref 150–400)
RBC: 4.47 MIL/uL (ref 3.87–5.11)
RDW: 13.5 % (ref 11.5–15.5)
WBC: 7.3 10*3/uL (ref 4.0–10.5)
nRBC: 0 % (ref 0.0–0.2)

## 2019-12-12 MED ORDER — SODIUM CHLORIDE 0.9% FLUSH: 3.0000 mL | Freq: Once | INTRAVENOUS | Status: DC

## 2019-12-12 NOTE — ED Notes (Signed)
Pt states that she is leaving.  

## 2019-12-12 NOTE — ED Triage Notes (Signed)
Pt c/o bil feet and leg pain x's 1 week.  St's now she is also having mid abd pain.  Denies nausea or vomiting  Denies injury

## 2019-12-13 ENCOUNTER — Emergency Department (HOSPITAL_COMMUNITY)
Admission: EM | Admit: 2019-12-13 | Discharge: 2019-12-13 | Disposition: A | Discharge status: Home / Self Care | Payer: Medicaid Other | Attending: Emergency Medicine | Admitting: Emergency Medicine

## 2019-12-13 ENCOUNTER — Emergency Department (HOSPITAL_COMMUNITY): Payer: Medicaid Other

## 2019-12-13 ENCOUNTER — Encounter (HOSPITAL_COMMUNITY): Payer: Self-pay | Admitting: Emergency Medicine

## 2019-12-13 ENCOUNTER — Other Ambulatory Visit: Payer: Self-pay

## 2019-12-13 DIAGNOSIS — R109 Unspecified abdominal pain: Secondary | ICD-10-CM | POA: Diagnosis present

## 2019-12-13 DIAGNOSIS — R935 Abnormal findings on diagnostic imaging of other abdominal regions, including retroperitoneum: Secondary | ICD-10-CM | POA: Diagnosis not present

## 2019-12-13 DIAGNOSIS — R1084 Generalized abdominal pain: Secondary | ICD-10-CM | POA: Diagnosis not present

## 2019-12-13 DIAGNOSIS — Z20822 Contact with and (suspected) exposure to covid-19: Secondary | ICD-10-CM | POA: Diagnosis not present

## 2019-12-13 DIAGNOSIS — L02211 Cutaneous abscess of abdominal wall: Secondary | ICD-10-CM | POA: Diagnosis not present

## 2019-12-13 LAB — I-STAT BETA HCG BLOOD, ED (MC, WL, AP ONLY): I-stat hCG, quantitative: <5 m[IU]/mL (ref <5)

## 2019-12-13 LAB — COMPREHENSIVE METABOLIC PANEL
ALT: 17 U/L (ref 0–44)
AST: 22 U/L (ref 15–41)
Albumin: 3.6 g/dL (ref 3.5–5.0)
Alkaline Phosphatase: 47 U/L (ref 38–126)
Anion gap: 8 (ref 5–15)
BUN: 8 mg/dL (ref 6–20)
CO2: 21 mmol/L — ABNORMAL LOW (ref 22–32)
Calcium: 9 mg/dL (ref 8.9–10.3)
Chloride: 112 mmol/L — ABNORMAL HIGH (ref 98–111)
Creatinine, Ser: 0.94 mg/dL (ref 0.44–1.00)
GFR calc Af Amer: >60 mL/min (ref >60)
GFR calc non Af Amer: >60 mL/min (ref >60)
Glucose, Bld: 97 mg/dL (ref 70–99)
Potassium: 4.1 mmol/L (ref 3.5–5.1)
Sodium: 141 mmol/L (ref 135–145)
Total Bilirubin: 0.5 mg/dL (ref 0.3–1.2)
Total Protein: 6.4 g/dL — ABNORMAL LOW (ref 6.5–8.1)

## 2019-12-13 LAB — CBC WITH DIFFERENTIAL/PLATELET
Abs Immature Granulocytes: 0 10*3/uL (ref 0.00–0.07)
Basophils Absolute: 0.1 10*3/uL (ref 0.0–0.1)
Basophils Relative: 1 %
Eosinophils Absolute: 0.1 10*3/uL (ref 0.0–0.5)
Eosinophils Relative: 1 %
HCT: 40.1 % (ref 36.0–46.0)
Hemoglobin: 12.8 g/dL (ref 12.0–15.0)
Immature Granulocytes: 0 %
Lymphocytes Relative: 42 %
Lymphs Abs: 2.2 10*3/uL (ref 0.7–4.0)
MCH: 28.4 pg (ref 26.0–34.0)
MCHC: 31.9 g/dL (ref 30.0–36.0)
MCV: 88.9 fL (ref 80.0–100.0)
Monocytes Absolute: 0.3 10*3/uL (ref 0.1–1.0)
Monocytes Relative: 6 %
Neutro Abs: 2.7 10*3/uL (ref 1.7–7.7)
Neutrophils Relative %: 50 %
Platelets: 382 10*3/uL (ref 150–400)
RBC: 4.51 MIL/uL (ref 3.87–5.11)
RDW: 13.5 % (ref 11.5–15.5)
WBC: 5.3 10*3/uL (ref 4.0–10.5)
nRBC: 0 % (ref 0.0–0.2)

## 2019-12-13 LAB — URINALYSIS, ROUTINE W REFLEX MICROSCOPIC
Bilirubin Urine: NEGATIVE
Glucose, UA: NEGATIVE mg/dL
Hgb urine dipstick: NEGATIVE
Ketones, ur: NEGATIVE mg/dL
Leukocytes,Ua: NEGATIVE
Nitrite: NEGATIVE
Protein, ur: NEGATIVE mg/dL
Specific Gravity, Urine: 1.025 (ref 1.005–1.030)
pH: 5 (ref 5.0–8.0)

## 2019-12-13 LAB — LIPASE, BLOOD: Lipase: 31 U/L (ref 11–51)

## 2019-12-13 LAB — RESPIRATORY PANEL BY RT PCR (FLU A&B, COVID)
Influenza A by PCR: NEGATIVE
Influenza B by PCR: NEGATIVE
SARS Coronavirus 2 by RT PCR: NEGATIVE

## 2019-12-13 MED ORDER — KETOROLAC TROMETHAMINE 30 MG/ML IJ SOLN
15.0000 mg | Freq: Once | INTRAMUSCULAR | Status: AC
  Administered 2019-12-13: 11:00:00 15 mg via INTRAVENOUS
  Filled 2019-12-13: qty 1

## 2019-12-13 MED ORDER — DICYCLOMINE HCL 20 MG PO TABS
20.0000 mg | ORAL_TABLET | Freq: Two times a day (BID) | ORAL | 0 refills | Status: DC
Start: 2019-12-13 — End: 2020-01-28

## 2019-12-13 MED ORDER — DICYCLOMINE HCL 10 MG/ML IM SOLN
20.0000 mg | Freq: Once | INTRAMUSCULAR | Status: DC
  Filled 2019-12-13: qty 2

## 2019-12-13 MED ORDER — IOHEXOL 300 MG/ML  SOLN
100.0000 mL | Freq: Once | INTRAMUSCULAR | Status: AC | PRN
  Administered 2019-12-13: 100 mL via INTRAVENOUS

## 2019-12-13 NOTE — ED Notes (Signed)
Culture sent with urine sample 

## 2019-12-13 NOTE — ED Notes (Signed)
Pt transported to CT ?

## 2019-12-13 NOTE — Discharge Instructions (Addendum)
As discussed, your evaluation today has been largely reassuring.  But, it is important that you monitor your condition carefully, and do not hesitate to return to the ED if you develop new, or concerning changes in your condition.  Otherwise, please follow-up with your physician for appropriate ongoing care.  In addition to following up with a gastroenterologist, please use the provided referral information to follow-up with our [facility] to establish care with a primary care physician.

## 2019-12-13 NOTE — ED Triage Notes (Signed)
Patient arrives to ED with complaints of bi lateral feet and leg pain x1 week and abdominal & back pain x3 days. Had lab work drawn here yesterday but left before being seen.

## 2019-12-13 NOTE — ED Provider Notes (Signed)
[facility] EMERGENCY DEPARTMENT Provider Note   CSN: [id number] Arrival date & time: 12/13/19  0932     History Chief Complaint  Patient presents with  . Abdominal Pain    Phyllis Kidd is a 24 y.o. female.  HPI    Patient presents for the second time in 2 days with concern for abdominal pain, back pain. Patient states that she is generally well.  She does have a history of 2 prior pregnancies, and C-section performed 5 years ago. She was in her usual state of health until 1 week ago.  At that time, without clear precipitant she developed pain.  The pain was initially in her lower extremities, but subsequently spread to include her back, abdomen, diffusely, sore, persistent, worse with activity.  It does not seem she is taking medication for relief, and there are no clear alleviating factors. With persistent pain she presented yesterday for evaluation.  She left prior to receiving her results or evaluation. She denies concurrent fever, vomiting, diarrhea, incontinence. She takes no medication regularly, does use Depo-Provera injections for birth control. She smokes cigarettes, does not drink alcohol.  Past Medical History:  Diagnosis Date  . Anxiety 2016  . Herpes simplex virus (HSV) infection   . VBAC, delivered     Patient Active Problem List   Diagnosis Date Noted  . 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 CESAREAN SECTION  01/18/2016     OB History    Gravida  2   Para  2   Term  2   Preterm  0   AB  0   Living  2     SAB  0   TAB  0   Ectopic  0   Multiple  0   Live Births  2           Family History  Family history unknown: Yes    Social History   Tobacco Use  . Smoking status: Never Smoker  . Smokeless tobacco: Never Used  Substance Use Topics  . Alcohol use: No  . Drug use: No    Home Medications Prior to Admission medications    Medication Sig Start Date End Date Taking? Authorizing Provider  amoxicillin-clavulanate (AUGMENTIN) 875-125 MG tablet Take 1 tablet by mouth every 12 (twelve) hours. Patient not taking: Reported on 12/13/2019 05/16/19   Lorin Glass, PA-C  dicyclomine (BENTYL) 20 MG tablet Take 1 tablet (20 mg total) by mouth 2 (two) times daily for 10 days. 12/13/19 12/23/19  Carmin Muskrat, MD  medroxyPROGESTERone (DEPO-PROVERA) 150 MG/ML injection Inject 1 mL (150 mg total) into the muscle every 3 (three) months. Patient not taking: Reported on 03/27/2019 02/16/18   Kandis Cocking A, CNM  medroxyPROGESTERone (DEPO-PROVERA) 150 MG/ML injection Inject 1 mL (150 mg total) into the muscle every 3 (three) months. 06/12/19   Sloan Leiter, MD  metroNIDAZOLE (FLAGYL) 500 MG tablet Take 1 tablet (500 mg total) by mouth 2 (two) times daily. Patient not taking: Reported on 12/13/2019 03/29/19   Sloan Leiter, MD  ondansetron (ZOFRAN ODT) 4 MG disintegrating tablet Take 1 tablet (4 mg total) by mouth every 8 (eight) hours as needed for nausea or vomiting. Patient not taking: Reported on 12/13/2019 05/16/19   Lorin Glass, PA-C    Allergies    Patient has no known allergies.  Review of Systems   Review of Systems  Constitutional:       Per HPI, otherwise negative  HENT:       Per HPI, otherwise negative  Respiratory:       Per HPI, otherwise negative  Cardiovascular:       Per HPI, otherwise negative  Gastrointestinal: Negative for vomiting.  Endocrine:       Negative aside from HPI  Genitourinary:       Neg aside from HPI   Musculoskeletal:       Per HPI, otherwise negative  Skin: Negative.   Neurological: Negative for syncope and weakness.    Physical Exam Updated Vital Signs BP 115/70   Pulse 62   Temp 99 F (37.2 C) (Oral)   Resp 18   SpO2 100%   Physical Exam Vitals and nursing note reviewed.  Constitutional:      General: She is not in acute distress.    Appearance: She is  well-developed.  HENT:     Head: Normocephalic and atraumatic.  Eyes:     Conjunctiva/sclera: Conjunctivae normal.  Cardiovascular:     Rate and Rhythm: Normal rate and regular rhythm.  Pulmonary:     Effort: Pulmonary effort is normal. No respiratory distress.     Breath sounds: Normal breath sounds. No stridor.  Abdominal:     General: There is no distension.     Tenderness: There is generalized abdominal tenderness. There is guarding.  Skin:    General: Skin is warm and dry.  Neurological:     Mental Status: She is alert and oriented to person, place, and time.     Cranial Nerves: No cranial nerve deficit.     ED Results / Procedures / Treatments   Labs (all labs ordered are listed, but only abnormal results are displayed) Labs Reviewed  COMPREHENSIVE METABOLIC PANEL - Abnormal; Notable for the following components:      Result Value   Chloride 112 (*)    CO2 21 (*)    Total Protein 6.4 (*)    All other components within normal limits  RESPIRATORY PANEL BY RT PCR (FLU A&B, COVID)  LIPASE, BLOOD  CBC WITH DIFFERENTIAL/PLATELET  URINALYSIS, ROUTINE W REFLEX MICROSCOPIC  I-STAT BETA HCG BLOOD, ED ([facility], [facility], [facility] ONLY)    EKG None  Radiology CT Abdomen Pelvis W Contrast  Result Date: 12/13/2019 CLINICAL DATA:  Abdominal abscess/infection suspected. EXAM: CT ABDOMEN AND PELVIS WITH CONTRAST TECHNIQUE: Multidetector CT imaging of the abdomen and pelvis was performed using the standard protocol following bolus administration of intravenous contrast. CONTRAST:  OMNIPAQUE IOHEXOL 300 MG/ML  SOLN COMPARISON:  February 24, 2016. FINDINGS: Lower chest: The lung bases are clear. The heart size is normal. Hepatobiliary: The liver is normal. Normal gallbladder.There is no biliary ductal dilation. Pancreas: Normal contours without ductal dilatation. No peripancreatic fluid collection. Spleen: Unremarkable. Adrenals/Urinary Tract: --Adrenal glands: Unremarkable. --Right kidney/ureter: No  hydronephrosis or radiopaque kidney stones. --Left kidney/ureter: No hydronephrosis or radiopaque kidney stones. --Urinary bladder: Unremarkable. Stomach/Bowel: --Stomach/Duodenum: No hiatal hernia or other gastric abnormality. Normal duodenal course and caliber. --Small bowel: There is some mild hyperenhancement and possible wall thickening of the terminal ileum. --Colon: Unremarkable. --Appendix: Normal. Vascular/Lymphatic: Normal course and caliber of the major abdominal vessels. --No retroperitoneal lymphadenopathy. --there are few slightly prominent mesenteric lymph nodes in the right lower quadrant. --No pelvic or inguinal lymphadenopathy. Reproductive: Unremarkable Other: No ascites or free air. The abdominal wall is normal. Musculoskeletal. No acute displaced fractures. IMPRESSION: 1. Normal appendix. 2. Questionable mild  wall thickening and hyperenhancement of the terminal ileum with prominent right lower quadrant lymph nodes may indicate underlying mesenteric adenitis in the appropriate clinical setting. Electronically Signed   By: Katherine Mantle M.D.   On: 12/13/2019 15:01    Procedures Procedures (including critical care time)  Medications Ordered in ED Medications  dicyclomine (BENTYL) injection 20 mg (20 mg Intramuscular Refused 12/13/19 1332)  ketorolac (TORADOL) 30 MG/ML injection 15 mg (15 mg Intravenous Given 12/13/19 1037)  iohexol (OMNIPAQUE) 300 MG/ML solution 100 mL (100 mLs Intravenous Contrast Given 12/13/19 1428)    ED Course  I have reviewed the triage vital signs and the nursing notes.  Pertinent labs & imaging results that were available during my care of the patient were reviewed by me and considered in my medical decision making (see chart for details).   Update: Patient deferred pain medication, as it was an intramuscular injection. MDM Rules/Calculators/A&P                      4:09 PM Patient awake, alert, in no distress, speaking clearly, hemodynamically  unremarkable.  On we discussed all findings including reassuring labs, negative Covid test, no substantial electrolyte abnormalities, no leukocytosis, no fever, all reassuring for the absence of substantial intra-abdominal infection. However, the patient is found to have some CT abnormalities concerning for terminal ileum edema, possible terminal ileitis versus other inflammatory condition.  No associated abscess, free fluid, and the patient is appropriate for close follow-up with our GI colleagues. Patient was started on oral medication, discharged in stable condition. Final Clinical Impression(s) / ED Diagnoses Final diagnoses:  Generalized abdominal pain    Rx / DC Orders ED Discharge Orders         Ordered    dicyclomine (BENTYL) 20 MG tablet  2 times daily     12/13/19 1607           Gerhard Munch, MD 12/13/19 1610

## 2019-12-23 ENCOUNTER — Encounter: Payer: Self-pay | Admitting: Gastroenterology

## 2020-01-23 ENCOUNTER — Emergency Department (HOSPITAL_COMMUNITY)
Admission: EM | Admit: 2020-01-23 | Discharge: 2020-01-23 | Disposition: A | Discharge status: Home / Self Care | Payer: Medicaid Other | Attending: Emergency Medicine | Admitting: Emergency Medicine

## 2020-01-23 ENCOUNTER — Other Ambulatory Visit: Payer: Self-pay

## 2020-01-23 DIAGNOSIS — R109 Unspecified abdominal pain: Secondary | ICD-10-CM | POA: Diagnosis not present

## 2020-01-23 DIAGNOSIS — Z5321 Procedure and treatment not carried out due to patient leaving prior to being seen by health care provider: Secondary | ICD-10-CM | POA: Diagnosis not present

## 2020-01-23 LAB — COMPREHENSIVE METABOLIC PANEL
ALT: 16 U/L (ref 0–44)
AST: 18 U/L (ref 15–41)
Albumin: 4.1 g/dL (ref 3.5–5.0)
Alkaline Phosphatase: 56 U/L (ref 38–126)
Anion gap: 6 (ref 5–15)
BUN: 8 mg/dL (ref 6–20)
CO2: 24 mmol/L (ref 22–32)
Calcium: 9.1 mg/dL (ref 8.9–10.3)
Chloride: 109 mmol/L (ref 98–111)
Creatinine, Ser: 0.91 mg/dL (ref 0.44–1.00)
GFR calc Af Amer: >60 mL/min (ref >60)
GFR calc non Af Amer: >60 mL/min (ref >60)
Glucose, Bld: 89 mg/dL (ref 70–99)
Potassium: 4.1 mmol/L (ref 3.5–5.1)
Sodium: 139 mmol/L (ref 135–145)
Total Bilirubin: 0.2 mg/dL — ABNORMAL LOW (ref 0.3–1.2)
Total Protein: 6.9 g/dL (ref 6.5–8.1)

## 2020-01-23 LAB — CBC
HCT: 39.1 % (ref 36.0–46.0)
Hemoglobin: 12.4 g/dL (ref 12.0–15.0)
MCH: 27.9 pg (ref 26.0–34.0)
MCHC: 31.7 g/dL (ref 30.0–36.0)
MCV: 87.9 fL (ref 80.0–100.0)
Platelets: 376 10*3/uL (ref 150–400)
RBC: 4.45 MIL/uL (ref 3.87–5.11)
RDW: 12.6 % (ref 11.5–15.5)
WBC: 5.5 10*3/uL (ref 4.0–10.5)
nRBC: 0 % (ref 0.0–0.2)

## 2020-01-23 LAB — I-STAT BETA HCG BLOOD, ED (MC, WL, AP ONLY): I-stat hCG, quantitative: <5 m[IU]/mL (ref <5)

## 2020-01-23 LAB — LIPASE, BLOOD: Lipase: 28 U/L (ref 11–51)

## 2020-01-23 NOTE — ED Triage Notes (Signed)
Pt here from home for eval of abdominal pain radiating to back. Sts she was seen here for sharp pains, including this one, all over her body x several months. The pain in the other parts of her body is now gone, but she sts the abdominal pain has not changed. Sts it feels similar to labor pain. Has an appt on 6/22 but could not wait d/t pain.

## 2020-01-28 ENCOUNTER — Ambulatory Visit: Payer: Medicaid Other | Admitting: Gastroenterology

## 2020-01-28 ENCOUNTER — Other Ambulatory Visit (INDEPENDENT_AMBULATORY_CARE_PROVIDER_SITE_OTHER): Payer: Medicaid Other

## 2020-01-28 ENCOUNTER — Encounter: Payer: Self-pay | Admitting: Gastroenterology

## 2020-01-28 VITALS — BP 120/80 | HR 68 | Ht 63.0 in | Wt 207.0 lb

## 2020-01-28 DIAGNOSIS — R935 Abnormal findings on diagnostic imaging of other abdominal regions, including retroperitoneum: Secondary | ICD-10-CM

## 2020-01-28 DIAGNOSIS — R1084 Generalized abdominal pain: Secondary | ICD-10-CM

## 2020-01-28 LAB — HIGH SENSITIVITY CRP: CRP, High Sensitivity: 0.55 mg/L (ref 0.000–5.000)

## 2020-01-28 LAB — SEDIMENTATION RATE: Sed Rate: 25 mm/hr — ABNORMAL HIGH (ref 0–20)

## 2020-01-28 MED ORDER — PLENVU 140 G PO SOLR: 1.0000 | Freq: Once | ORAL | 0 refills | Status: AC

## 2020-01-28 NOTE — Progress Notes (Signed)
 Referring Provider: No ref. provider found Primary Care Physician:  Patient, No Pcp Per  Reason for Consultation:  Abdominal pain   IMPRESSION:  Lower abdominal pain - radiates from the feet and legs Abnormal CT with TI thickening and associated LAD without diarrhea or constipation    - normal platelets, albumin, and hemoglobin  Unclear clinical significance of CT scan. Evaluation with colonoscopy and evaluation of TI recommended, as well as blood and stool studies to screen for IBD.  Recent symptoms are more diffuse and sound more consistent with radiculopathy as opposed to a primary etiology.     PLAN: ESR, CRP, fecal calprotectin Colonoscopy with evaluation of the TI Establish care with PCP to evaluate for non-GI causes of her symptoms  Please see the "Patient Instructions" section for addition details about the plan.  HPI: Phyllis Kidd is a 23 y.o. female referred by the ER. She does not have a primary care prover. She has not had a Covid vaccine. She has anxiety, history of 2 prior pregnancies, and prior C-section.   Reports several months of symptoms, although she first sought care in May.  Pain started in her feet. Then starting shooting up her legs. Leg pain is most severe at night. Now, it also increased her back, lower abdomen. No identified precipitant. Worse with exercise.  No relieving features.  No diarrhea, constipation, or blood in the stool. No fecal or urinary incontinence.  Worsening pain in her hands that frequently "locks up"  that has prevented her from working.  Evaluated in the ED for abdominal pain 09/18/19 and 12/13/19. CT suggested TI thickening. Recent CBC and CMP are normal.  Bentyl prescribed by the ED made things worse.  She has two children - ages 4 and 2 who are healthy.   Abdominal/Pelvis CT 12/13/19: mild wall thickening and hyperenhancement of the terminal ileum with prominent right lower quadrant lymph nodes may indicate underlying mesenteric  adenitis in the appropriate clinical setting.  Labs 01/23/20: normal CBC, normal CMP  No family history of autoimmune disease or IBD. No known family history of colon cancer or polyps. No family history of uterine/endometrial cancer, pancreatic cancer or gastric/stomach cancer.   Past Medical History:  Diagnosis Date  . Anxiety 2016  . Herpes simplex virus (HSV) infection   . VBAC, delivered     Past Surgical History:  Procedure Laterality Date  . CESAREAN SECTION  01/18/2016    Current Outpatient Medications  Medication Sig Dispense Refill  . medroxyPROGESTERone (DEPO-PROVERA) 150 MG/ML injection Inject 1 mL (150 mg total) into the muscle every 3 (three) months. 1 mL 3   No current facility-administered medications for this visit.    Allergies as of 01/28/2020  . (No Known Allergies)    Family History  Family history unknown: Yes    Social History   Socioeconomic History  . Marital status: Single    Spouse name: Not on file  . Number of children: 2  . Years of education: Not on file  . Highest education level: Not on file  Occupational History  . Not on file  Tobacco Use  . Smoking status: Never Smoker  . Smokeless tobacco: Never Used  Substance and Sexual Activity  . Alcohol use: No  . Drug use: No  . Sexual activity: Not Currently    Birth control/protection: None  Other Topics Concern  . Not on file  Social History Narrative   ** Merged History Encounter **       Social   Determinants of Health   Financial Resource Strain:   . Difficulty of Paying Living Expenses:   Food Insecurity:   . Worried About Running Out of Food in the Last Year:   . Ran Out of Food in the Last Year:   Transportation Needs:   . Lack of Transportation (Medical):   . Lack of Transportation (Non-Medical):   Physical Activity:   . Days of Exercise per Week:   . Minutes of Exercise per Session:   Stress:   . Feeling of Stress :   Social Connections:   . Frequency of  Communication with Friends and Family:   . Frequency of Social Gatherings with Friends and Family:   . Attends Religious Services:   . Active Member of Clubs or Organizations:   . Attends Club or Organization Meetings:   . Marital Status:   Intimate Partner Violence:   . Fear of Current or Ex-Partner:   . Emotionally Abused:   . Physically Abused:   . Sexually Abused:     Review of Systems: 12 system ROS is negative except as noted above with the additions of back pain, depression, headaches, muscle pains, shortness of breath, insomnia. .   Physical Exam: General:   Alert,  well-nourished, pleasant and cooperative in NAD Head:  Normocephalic and atraumatic. Eyes:  Sclera clear, no icterus.   Conjunctiva pink. Ears:  Normal auditory acuity. Nose:  No deformity, discharge,  or lesions. Mouth:  No deformity or lesions.   Neck:  Supple; no masses or thyromegaly. Lungs:  Clear throughout to auscultation.   No wheezes. Heart:  Regular rate and rhythm; no murmurs. Abdomen:  Soft, nontender, nondistended, normal bowel sounds, no rebound or guarding. No hepatosplenomegaly.   Rectal:  Deferred  Msk:  Symmetrical. No boney deformities LAD: No inguinal or umbilical LAD Extremities:  No clubbing or edema. Neurologic:  Alert and  oriented x4;  grossly nonfocal Skin:  Intact without significant lesions or rashes. Psych:  Alert and cooperative. Normal mood and affect.     Kimberly L. Beavers, MD, MPH 01/28/2020, 11:06 AM     

## 2020-01-28 NOTE — Patient Instructions (Signed)
If you are age 23 or older, your body mass index should be between 23-30. Your Body mass index is 36.67 kg/m. If this is out of the aforementioned range listed, please consider follow up with your Primary Care Provider.  If you are age 82 or younger, your body mass index should be between 19-25. Your Body mass index is 36.67 kg/m. If this is out of the aformentioned range listed, please consider follow up with your Primary Care Provider.     Your provider has requested that you go to the basement level for lab work before leaving today. Press "B" on the elevator. The lab is located at the first door on the left as you exit the elevator.  You have been scheduled for a colonoscopy. Please follow written instructions given to you at your visit today.  Please pick up your prep supplies at the pharmacy within the next 1-3 days. If you use inhalers (even only as needed), please bring them with you on the day of your procedure.  Thank you for trusting me with your gastrointestinal care!    Tressia Danas, MD, MPH

## 2020-01-30 ENCOUNTER — Other Ambulatory Visit: Payer: Medicaid Other

## 2020-01-30 DIAGNOSIS — R935 Abnormal findings on diagnostic imaging of other abdominal regions, including retroperitoneum: Secondary | ICD-10-CM | POA: Diagnosis not present

## 2020-01-30 DIAGNOSIS — R1084 Generalized abdominal pain: Secondary | ICD-10-CM | POA: Diagnosis not present

## 2020-02-03 LAB — CALPROTECTIN, FECAL: Calprotectin, Fecal: 42 ug/g (ref 0–120)

## 2020-03-02 ENCOUNTER — Ambulatory Visit (INDEPENDENT_AMBULATORY_CARE_PROVIDER_SITE_OTHER): Payer: Medicaid Other

## 2020-03-02 ENCOUNTER — Other Ambulatory Visit: Payer: Self-pay | Admitting: Gastroenterology

## 2020-03-02 DIAGNOSIS — Z1159 Encounter for screening for other viral diseases: Secondary | ICD-10-CM

## 2020-03-03 LAB — SARS CORONAVIRUS 2 (TAT 6-24 HRS): SARS Coronavirus 2: NEGATIVE

## 2020-03-04 ENCOUNTER — Other Ambulatory Visit: Payer: Self-pay

## 2020-03-04 ENCOUNTER — Telehealth: Payer: Self-pay | Admitting: Gastroenterology

## 2020-03-04 ENCOUNTER — Ambulatory Visit (AMBULATORY_SURGERY_CENTER): Payer: Medicaid Other | Admitting: Gastroenterology

## 2020-03-04 ENCOUNTER — Encounter: Payer: Self-pay | Admitting: Gastroenterology

## 2020-03-04 VITALS — BP 99/57 | HR 60 | Temp 97.3°F | Resp 19 | Ht 63.0 in | Wt 207.0 lb

## 2020-03-04 DIAGNOSIS — R1084 Generalized abdominal pain: Secondary | ICD-10-CM | POA: Diagnosis not present

## 2020-03-04 DIAGNOSIS — R935 Abnormal findings on diagnostic imaging of other abdominal regions, including retroperitoneum: Secondary | ICD-10-CM | POA: Diagnosis not present

## 2020-03-04 DIAGNOSIS — K649 Unspecified hemorrhoids: Secondary | ICD-10-CM | POA: Diagnosis not present

## 2020-03-04 DIAGNOSIS — F419 Anxiety disorder, unspecified: Secondary | ICD-10-CM | POA: Diagnosis not present

## 2020-03-04 DIAGNOSIS — K5730 Diverticulosis of large intestine without perforation or abscess without bleeding: Secondary | ICD-10-CM | POA: Diagnosis not present

## 2020-03-04 DIAGNOSIS — R933 Abnormal findings on diagnostic imaging of other parts of digestive tract: Secondary | ICD-10-CM | POA: Diagnosis not present

## 2020-03-04 MED ORDER — DICYCLOMINE HCL 10 MG PO CAPS
20.0000 mg | ORAL_CAPSULE | Freq: Four times a day (QID) | ORAL | 3 refills | Status: DC | PRN
Start: 2020-03-04 — End: 2021-02-22

## 2020-03-04 MED ORDER — SODIUM CHLORIDE 0.9 % IV SOLN: 500.0000 mL | Freq: Once | INTRAVENOUS | Status: DC

## 2020-03-04 NOTE — Progress Notes (Signed)
Pt's states no medical or surgical changes since previsit or office visit.  Vitals- Courtney 

## 2020-03-04 NOTE — Telephone Encounter (Signed)
Pt is experiencing complications with her prep, she has a colonoscopy procedure scheduled for today @3pm . Pt would like a call back ASAP please.

## 2020-03-04 NOTE — Telephone Encounter (Signed)
Called pt. She stated" I have a little bit of my prep that I can't get down  because I keep vomiting,have vomited x3,when asked what are her stools looking like she described it as urine,denies solid stool,spoke with Dr. Orvan Falconer and she that sounds like it is ok,made pt. Aware that she should arrive at scheduled time and we will proceed with procedure,she verbalize understanding.

## 2020-03-04 NOTE — Op Note (Addendum)
[facility] Patient Name: Phyllis Kidd Procedure Date: 03/04/2020 2:34 PM MRN: [id number] Endoscopist: Tressia Danas MD, MD Age: 24 Referring MD:  Date of Birth: 01-03-1996 Gender: Female Account #: [id number] Procedure:                Colonoscopy Indications:              Abdominal pain, Abnormal CT of the GI tract                           Lower abdominal pain - radiates from the feet and                            legs                           Abnormal CT with TI thickening and associated LAD                            without diarrhea or constipation                           - normal platelets, albumin, and hemoglobinLower Medicines:                Monitored Anesthesia Care Procedure:                Pre-Anesthesia Assessment:                           - Prior to the procedure, a History and Physical                            was performed, and patient medications and                            allergies were reviewed. The patient's tolerance of                            previous anesthesia was also reviewed. The risks                            and benefits of the procedure and the sedation                            options and risks were discussed with the patient.                            All questions were answered, and informed consent                            was obtained. Prior Anticoagulants: The patient has                            taken no previous anticoagulant or antiplatelet  agents. ASA Grade Assessment: II - A patient with                            mild systemic disease. After reviewing the risks                            and benefits, the patient was deemed in                            satisfactory condition to undergo the procedure.                           After obtaining informed consent, the colonoscope                            was passed under direct vision. Throughout the                             procedure, the patient's blood pressure, pulse, and                            oxygen saturations were monitored continuously. The                            Colonoscope was introduced through the anus and                            advanced to the 6 cm into the ileum. The                            colonoscopy was performed without difficulty. The                            patient tolerated the procedure well. The quality                            of the bowel preparation was good. The terminal                            ileum, ileocecal valve, appendiceal orifice, and                            rectum were photographed. Scope In: 2:48:32 PM Scope Out: 2:59:40 PM Scope Withdrawal Time: 0 hours 7 minutes 5 seconds  Total Procedure Duration: 0 hours 11 minutes 8 seconds  Findings:                 The perianal and digital rectal examinations were                            normal.                           A few small-mouthed diverticula were found in the  sigmoid colon.                           Non-bleeding internal hemorrhoids were found. The                            hemorrhoids were small.                           The exam was otherwise without abnormality on                            direct and retroflexion views. Complications:            No immediate complications. Estimated Blood Loss:     Estimated blood loss: none. Impression:               - Diverticulosis in the sigmoid colon.                           - Non-bleeding internal hemorrhoids.                           - The examination was otherwise normal on direct                            and retroflexion views.                           - No source for abdominal pain identified on this                            study. Recommendation:           - Patient has a contact number available for                            emergencies. The signs and symptoms of potential                             delayed complications were discussed with the                            patient. Return to normal activities tomorrow.                            Written discharge instructions were provided to the                            patient.                           - Resume previous diet.                           - Continue present medications.                           -  Start dicyclomine 20 mg QID prn pain.                           - Repeat colonoscopy at age 25 for screening                            purposes.                           - Emerging evidence supports eating a diet of                            fruits, vegetables, grains, calcium, and yogurt                            while reducing red meat and alcohol may reduce the                            risk of colon cancer. Tressia Danas MD, MD 03/04/2020 3:04:51 PM This report has been signed electronically.

## 2020-03-04 NOTE — Patient Instructions (Signed)
New prescription for Bentyl sent to [facility] pharmacy on [address]   YOU HAD AN ENDOSCOPIC PROCEDURE TODAY AT THE [facility]:   Refer to the procedure report that was given to you for any specific questions about what was found during the examination.  If the procedure report does not answer your questions, please call your gastroenterologist to clarify.  If you requested that your care partner not be given the details of your procedure findings, then the procedure report has been included in a sealed envelope for you to review at your convenience later.  YOU SHOULD EXPECT: Some feelings of bloating in the abdomen. Passage of more gas than usual.  Walking can help get rid of the air that was put into your GI tract during the procedure and reduce the bloating. If you had a lower endoscopy (such as a colonoscopy or flexible sigmoidoscopy) you may notice spotting of blood in your stool or on the toilet paper. If you underwent a bowel prep for your procedure, you may not have a normal bowel movement for a few days.  Please Note:  You might notice some irritation and congestion in your nose or some drainage.  This is from the oxygen used during your procedure.  There is no need for concern and it should clear up in a day or so.  SYMPTOMS TO REPORT IMMEDIATELY:   Following lower endoscopy (colonoscopy or flexible sigmoidoscopy):  Excessive amounts of blood in the stool  Significant tenderness or worsening of abdominal pains  Swelling of the abdomen that is new, acute  Fever of 100F or higher  For urgent or emergent issues, a gastroenterologist can be reached at any hour by calling [phone]. Do not use MyChart messaging for urgent concerns.    DIET:  We do recommend a small meal at first, but then you may proceed to your regular diet.  Drink plenty of fluids but you should avoid alcoholic beverages for 24 hours.  ACTIVITY:  You should plan to take it easy for the rest of  today and you should NOT DRIVE or use heavy machinery until tomorrow (because of the sedation medicines used during the test).    FOLLOW UP: Our staff will call the number listed on your records 48-72 hours following your procedure to check on you and address any questions or concerns that you may have regarding the information given to you following your procedure. If we do not reach you, we will leave a message.  We will attempt to reach you two times.  During this call, we will ask if you have developed any symptoms of COVID 19. If you develop any symptoms (ie: fever, flu-like symptoms, shortness of breath, cough etc.) before then, please call [phone].  If you test positive for Covid 19 in the 2 weeks post procedure, please call and report this information to [place].    SIGNATURES/CONFIDENTIALITY: You and/or your care partner have signed paperwork which will be entered into your electronic medical record.  These signatures attest to the fact that that the information above on your After Visit Summary has been reviewed and is understood.  Full responsibility of the confidentiality of this discharge information lies with you and/or your care-partner.

## 2020-03-04 NOTE — Progress Notes (Signed)
Report to PACU, RN, vss, BBS= Clear.  

## 2020-03-06 ENCOUNTER — Telehealth: Payer: Self-pay

## 2020-03-06 NOTE — Telephone Encounter (Signed)
Left message on answering machine. 

## 2020-03-06 NOTE — Telephone Encounter (Signed)
Patient returned your follow-up call after her procedure. She stated to be feeling fine.  

## 2020-03-06 NOTE — Telephone Encounter (Signed)
  Follow up Call-  Call back number 03/04/2020  Post procedure Call Back phone  # [phone]  Permission to leave phone message Yes  Some recent data might be hidden     Patient questions:  Do you have a fever, pain , or abdominal swelling? No. Pain Score  0 *  Have you tolerated food without any problems? Yes.    Have you been able to return to your normal activities? Yes.    Do you have any questions about your discharge instructions: Diet   No. Medications  No. Follow up visit  No.  Do you have questions or concerns about your Care? No.  Actions: * If pain score is 4 or above: No action needed, pain <4. 1. Have you developed a fever since your procedure? no  2.   Have you had an respiratory symptoms (SOB or cough) since your procedure? no  3.   Have you tested positive for COVID 19 since your procedure no  4.   Have you had any family members/close contacts diagnosed with the COVID 19 since your procedure?  no   If yes to any of these questions please route to Laverna Peace, RN and Charlett Lango, RN

## 2020-03-11 ENCOUNTER — Emergency Department (HOSPITAL_COMMUNITY)
Admission: EM | Admit: 2020-03-11 | Discharge: 2020-03-11 | Disposition: A | Discharge status: Home / Self Care | Payer: Medicaid Other | Attending: Emergency Medicine | Admitting: Emergency Medicine

## 2020-03-11 ENCOUNTER — Emergency Department (HOSPITAL_COMMUNITY): Payer: Medicaid Other

## 2020-03-11 ENCOUNTER — Encounter (HOSPITAL_COMMUNITY): Payer: Self-pay | Admitting: Emergency Medicine

## 2020-03-11 DIAGNOSIS — R001 Bradycardia, unspecified: Secondary | ICD-10-CM | POA: Diagnosis not present

## 2020-03-11 DIAGNOSIS — M7989 Other specified soft tissue disorders: Secondary | ICD-10-CM | POA: Diagnosis not present

## 2020-03-11 DIAGNOSIS — M67432 Ganglion, left wrist: Secondary | ICD-10-CM | POA: Diagnosis not present

## 2020-03-11 DIAGNOSIS — M25532 Pain in left wrist: Secondary | ICD-10-CM | POA: Diagnosis not present

## 2020-03-11 MED ORDER — ACETAMINOPHEN 500 MG PO TABS
1000.0000 mg | ORAL_TABLET | Freq: Once | ORAL | Status: DC
  Filled 2020-03-11: qty 2

## 2020-03-11 NOTE — ED Provider Notes (Signed)
[facility] EMERGENCY DEPARTMENT Provider Note   CSN: [id number] Arrival date & time: 03/11/20  1118     History Chief Complaint  Patient presents with  . Wrist Pain    Phyllis Kidd is a 24 y.o. female.  Patient presents with persistent left wrist pain for over a month.  Pain with movement.  Patient recalls injuring it through hyperflexing.  No neurologic symptoms.  No fevers or chills.        Past Medical History:  Diagnosis Date  . Anxiety 2016  . Herpes simplex virus (HSV) infection   . VBAC, delivered     Patient Active Problem List   Diagnosis Date Noted  . 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 CESAREAN SECTION  01/18/2016     OB History    Gravida  2   Para  2   Term  2   Preterm  0   AB  0   Living  2     SAB  0   TAB  0   Ectopic  0   Multiple  0   Live Births  2           Family History  Family history unknown: Yes    Social History   Tobacco Use  . Smoking status: Never Smoker  . Smokeless tobacco: Never Used  Substance Use Topics  . Alcohol use: No  . Drug use: No    Home Medications Prior to Admission medications   Medication Sig Start Date End Date Taking? Authorizing Provider  dicyclomine (BENTYL) 10 MG capsule Take 2 capsules (20 mg total) by mouth 4 (four) times daily as needed for spasms (pain). 03/04/20   Tressia Danas, MD  medroxyPROGESTERone (DEPO-PROVERA) 150 MG/ML injection Inject 1 mL (150 mg total) into the muscle every 3 (three) months. 06/12/19   Conan Bowens, MD    Allergies    Patient has no known allergies.  Review of Systems   Review of Systems  Constitutional: Negative for chills and fever.  HENT: Negative for congestion.   Eyes: Negative for visual disturbance.  Respiratory: Negative for shortness of breath.   Cardiovascular: Negative for chest pain.  Gastrointestinal: Negative for  abdominal pain and vomiting.  Genitourinary: Negative for dysuria and flank pain.  Musculoskeletal: Positive for joint swelling. Negative for back pain, neck pain and neck stiffness.  Skin: Negative for rash.  Neurological: Negative for weakness, light-headedness, numbness and headaches.    Physical Exam Updated Vital Signs BP 122/69   Pulse (!) 52   Temp 98.7 F (37.1 C)   Resp 16   SpO2 97%   Physical Exam Vitals and nursing note reviewed.  Constitutional:      Appearance: She is well-developed.  HENT:     Head: Normocephalic and atraumatic.  Eyes:     General:        Right eye: No discharge.        Left eye: No discharge.     Conjunctiva/sclera: Conjunctivae normal.  Neck:     Trachea: No tracheal deviation.  Cardiovascular:     Rate and Rhythm: Bradycardia present.  Pulmonary:     Effort: Pulmonary effort is normal.  Musculoskeletal:        General: Tenderness present. No swelling.     Cervical back: Normal range of motion and neck supple.     Comments: Patient has mild left  wrist tenderness with flexion and extension, no joint effusion, ganglion cyst on dorsal aspect of proximal hand.  No external signs of infection.  No bony tenderness.  Skin:    General: Skin is warm.     Findings: No rash.  Neurological:     Mental Status: She is alert and oriented to person, place, and time.     ED Results / Procedures / Treatments   Labs (all labs ordered are listed, but only abnormal results are displayed) Labs Reviewed - No data to display  EKG None  Radiology DG Wrist Complete Left  Result Date: 03/11/2020 CLINICAL DATA:  Recent hyperextension of the left wrist with pain and swelling, initial encounter EXAM: LEFT WRIST - COMPLETE 3+ VIEW COMPARISON:  None. FINDINGS: There is no evidence of fracture or dislocation. There is no evidence of arthropathy or other focal bone abnormality. Soft tissues are unremarkable. IMPRESSION: No acute abnormality noted. Electronically  Signed   By: Alcide Clever M.D.   On: 03/11/2020 12:00    Procedures Procedures (including critical care time)  Medications Ordered in ED Medications  acetaminophen (TYLENOL) tablet 1,000 mg (1,000 mg Oral Refused 03/11/20 1332)    ED Course  I have reviewed the triage vital signs and the nursing notes.  Pertinent labs & imaging results that were available during my care of the patient were reviewed by me and considered in my medical decision making (see chart for details).    MDM Rules/Calculators/A&P                          Patient presents with tendinitis of the wrist and ganglion cyst.  Ace wrap given.  Discussed supportive care and outpatient follow-up.  X-ray results reviewed no acute fractures.  Final Clinical Impression(s) / ED Diagnoses Final diagnoses:  Ganglion cyst of dorsum of left wrist    Rx / DC Orders ED Discharge Orders    None       Blane Ohara, MD 03/11/20 1345

## 2020-03-11 NOTE — ED Triage Notes (Signed)
Pt states she hyperextended her L wrist a while back, started noticing more pain and swelling x1 month.

## 2020-03-11 NOTE — Discharge Instructions (Addendum)
Use Ace wrap as needed for support. Follow-up with sports medicine for further treatment options.

## 2020-03-12 ENCOUNTER — Telehealth: Payer: Self-pay | Admitting: *Deleted

## 2020-03-12 NOTE — Telephone Encounter (Signed)
Pt called back:  Transition Care Management Follow-up Telephone Call   [facility] Managed Care Transition Call Status:MM [facility] Call Made   Date of discharge and from where: [facility], 03/11/20  How have you been since you were released from the hospital? "I feel fine"   Any questions or concerns? No  Items Reviewed:  Did the pt receive and understand the discharge instructions provided? No   Medications obtained and verified? No   Any new allergies since your discharge? No   Dietary orders reviewed?No  Do you have support at home? yes, family Functional Questionnaire: (I = Independent and D = Dependent)  ADLs: Independent Bathing/Dressing:Independent Meal Prep: Independent Eating: Independent Maintaining continence: Independent Transferring/Ambulation: Independent Managing Meds: Independent Follow up appointments reviewed:  PCP Hospital f/u appt confirmed? No  Pt would like to be assigned a PCP Specialist Hospital f/u appt confirmed?  Scheduled to see Guy Sandifer, Sports Medicine on 03/18/20@ 1130  Are transportation arrangements needed? No   If their condition worsens, is the pt aware to call PCP or go to the EmergencyDept.? Yes  Was the patient provided with contact information for the PCP's office or ED?  yes  Was to pt encouraged to call back with questions or concerns? Yes  Pt will be contacted by [facility] for assignment of PCP.   Burnard Bunting, RN, BSN, CCRN Patient Engagement Center [phone]

## 2020-03-12 NOTE — Telephone Encounter (Signed)
Medicaid Managed Care team Transition of Care Assessment outreach attempt #1 made today. Unable to reach patient. HIPPA compliant voice message left requesting a return call. The patient has also been enrolled in an automated discharge follow up call series and will receive two outreach attempts for transition of care assessment. Contact information has been left for the patient and the Medicaid Managed Care team is available to provide assistance to the patient at any time.  ° °Katrice Gilmar Bua, RN, BSN, CCRN °Patient Engagement Center °[phone] ° °

## 2020-03-12 NOTE — Telephone Encounter (Signed)
Email sent to  Internal Medicine to  request  NP appointment . °  °                             Phyllis Kidd °                                 PEC °                             [phone]       ° °

## 2020-03-18 ENCOUNTER — Other Ambulatory Visit: Payer: Self-pay

## 2020-03-18 ENCOUNTER — Ambulatory Visit (INDEPENDENT_AMBULATORY_CARE_PROVIDER_SITE_OTHER): Payer: Medicaid Other | Admitting: Family Medicine

## 2020-03-18 ENCOUNTER — Encounter: Payer: Self-pay | Admitting: Family Medicine

## 2020-03-18 VITALS — BP 106/66 | Ht 63.0 in | Wt 207.0 lb

## 2020-03-18 DIAGNOSIS — M67432 Ganglion, left wrist: Secondary | ICD-10-CM | POA: Diagnosis not present

## 2020-03-18 NOTE — Progress Notes (Signed)
PCP: Patient, No Pcp Per  Subjective:   HPI: Patient is a 24 y.o. female here for evaluation of hand pain. Patient reports that a couple months ago, she fell on her wrist with it in a hyperflexed position. Initially had some very mild pain but it resolved quickly. About 2 months later, she developed worsening swelling and pain on the dorsal aspect of her wrist. This is progressively worsened and is now radiating down her hand distally into her third digit. She has pain when she moves her hand but especially when she moves her third digit. She went to the ED recently, they felt that she likely has a ganglion cyst and recommend following up here, also recommended compression. She reports that compression is been somewhat helpful. She has tried Tylenol and ibuprofen with minimal benefit.  Past Medical History:  Diagnosis Date  . Anxiety 2016  . Herpes simplex virus (HSV) infection   . VBAC, delivered     Current Outpatient Medications on File Prior to Visit  Medication Sig Dispense Refill  . dicyclomine (BENTYL) 10 MG capsule Take 2 capsules (20 mg total) by mouth 4 (four) times daily as needed for spasms (pain). 90 capsule 3  . medroxyPROGESTERone (DEPO-PROVERA) 150 MG/ML injection Inject 1 mL (150 mg total) into the muscle every 3 (three) months. 1 mL 3   Current Facility-Administered Medications on File Prior to Visit  Medication Dose Route Frequency Provider Last Rate Last Admin  . 0.9 %  sodium chloride infusion  500 mL Intravenous Once Tressia Danas, MD        Past Surgical History:  Procedure Laterality Date  . CESAREAN SECTION  01/18/2016    No Known Allergies  Social History   Socioeconomic History  . Marital status: Single    Spouse name: Not on file  . Number of children: 2  . Years of education: Not on file  . Highest education level: Not on file  Occupational History  . Not on file  Tobacco Use  . Smoking status: Never Smoker  . Smokeless tobacco: Never Used   Substance and Sexual Activity  . Alcohol use: No  . Drug use: No  . Sexual activity: Not Currently    Birth control/protection: None  Other Topics Concern  . Not on file  Social History Narrative   ** Merged History Encounter **       Social Determinants of Health   Financial Resource Strain:   . Difficulty of Paying Living Expenses:   Food Insecurity:   . Worried About Programme researcher, broadcasting/film/video in the Last Year:   . Barista in the Last Year:   Transportation Needs:   . Freight forwarder (Medical):   Marland Kitchen Lack of Transportation (Non-Medical):   Physical Activity:   . Days of Exercise per Week:   . Minutes of Exercise per Session:   Stress:   . Feeling of Stress :   Social Connections:   . Frequency of Communication with Friends and Family:   . Frequency of Social Gatherings with Friends and Family:   . Attends Religious Services:   . Active Member of Clubs or Organizations:   . Attends Banker Meetings:   Marland Kitchen Marital Status:   Intimate Partner Violence:   . Fear of Current or Ex-Partner:   . Emotionally Abused:   Marland Kitchen Physically Abused:   . Sexually Abused:     Family History  Family history unknown: Yes    BP 106/66  Ht 5\' 3"  (1.6 m)   Wt 207 lb (93.9 kg)   BMI 36.67 kg/m   Review of Systems: See HPI above.     Objective:  Physical Exam:  Gen: NAD, comfortable in exam room  L Hand/Fingers/Wrist Inspection: Large approximately 2 cm nodule visible on the dorsal wrist, no bony deformity or atrophy of the hypothenar region  Palpation: nontender to palpation of DIP, PIP, MTP, scaphoid/snuff box, scaphoid tubercle. Tender to palpation over the mass on the dorsum of the wrist. Mass feels soft. She also has tenderness distally along the extensor tendon of the third digit AROM/PROM: Full flexion, extension, supination, pronation  Strength: 5/5 flexion, 5/5 extension, 5/5 pronation, 5/5 supination  Limited examination: Ultrasound of the dorsal  wrist reveals a large uncomplicated ganglion cyst which appears to be impinging upon the extensor tendon of the third digit.   Assessment & Plan:  1. Ganglion cyst  Patient with ganglion cyst visible on ultrasound today. Suspect that her distal hand pain is due to the proximity to the extensor tendon. We discussed options with patient, she elected to undergo ganglion cyst aspiration today. We discussed that this may recur, and if it does recur we could consider referral for surgery. She understands. Recommended continuing ibuprofen for 100 mg 3 times a day for the next 1 to 2 weeks as needed for pain. Follow-up as needed.  Procedure note: After informed consent was obtained, materials were prepared with sterile technique. Skin was swabbed with alcohol swab. A 25-gauge, 1-inch needle on a 3-mL syringe with approximately 2 mL of 0.25% bupivacaine without epi was injected. The ganglion was entered using an 18-gauge, 1.5-inch needle on a 5-mL syringe at an angle that provided optimal access to the cyst and comfort for the patient, and ~1cc gel like fluid was aspirated. Pressure was applied to achieve hemostasis, and the area was cleaned with 70% ethanol, and a sterile bandage was applied. Postprocedure instructions were reviewed.  Ultrasound guidance utilized for procedure.  Addendum:  Patient seen in the office by fellow.  His history, exam, plan of care were precepted with me.  Present for and supervised ultrasound guided synovial cyst aspiration. [place] MD Norton Blizzard

## 2020-03-30 ENCOUNTER — Ambulatory Visit (INDEPENDENT_AMBULATORY_CARE_PROVIDER_SITE_OTHER): Payer: Medicaid Other | Admitting: Student

## 2020-03-30 ENCOUNTER — Other Ambulatory Visit: Payer: Self-pay

## 2020-03-30 ENCOUNTER — Encounter: Payer: Self-pay | Admitting: Student

## 2020-03-30 DIAGNOSIS — Z0001 Encounter for general adult medical examination with abnormal findings: Secondary | ICD-10-CM | POA: Diagnosis present

## 2020-03-30 DIAGNOSIS — M79641 Pain in right hand: Secondary | ICD-10-CM | POA: Insufficient documentation

## 2020-03-30 DIAGNOSIS — Z308 Encounter for other contraceptive management: Secondary | ICD-10-CM

## 2020-03-30 DIAGNOSIS — Z1159 Encounter for screening for other viral diseases: Secondary | ICD-10-CM | POA: Diagnosis not present

## 2020-03-30 DIAGNOSIS — R109 Unspecified abdominal pain: Secondary | ICD-10-CM | POA: Insufficient documentation

## 2020-03-30 DIAGNOSIS — M79642 Pain in left hand: Secondary | ICD-10-CM | POA: Diagnosis not present

## 2020-03-30 DIAGNOSIS — Z0000 Encounter for general adult medical examination without abnormal findings: Secondary | ICD-10-CM | POA: Insufficient documentation

## 2020-03-30 DIAGNOSIS — K219 Gastro-esophageal reflux disease without esophagitis: Secondary | ICD-10-CM | POA: Diagnosis not present

## 2020-03-30 MED ORDER — PANTOPRAZOLE SODIUM 20 MG PO TBEC: 20.0000 mg | DELAYED_RELEASE_TABLET | Freq: Every day | ORAL | 1 refills | Status: DC

## 2020-03-30 NOTE — Patient Instructions (Addendum)
Thank you for allowing [place] to be a part of your care today, it was pleasure seeing you. We discussed your hand pain, abdominal pain, acid reflux, and preventive care.  I am checking these labs: CBC, ANA, CCP, rheumatoid factor, HIV, Hep C, ESR, CRP, urine pregnancy. I will call you with your lab results  I have made these changes to your medications: Start protonix 82m daily for your acid reflux   Please follow up in 1 month   Thank you, and please call the Internal Medicine Clinic at [phone] you have any questions.  Best, Dr. CBridgett Larsson

## 2020-03-30 NOTE — Progress Notes (Signed)
   CC: establish care, bilateral hand pain  HPI:  [name] is a 24 y.o. female with history as below presenting for bilateral hand pain and to establish care. Please refer to problem based charting for further details of assessment and plan of current problem and chronic medical conditions.  Past Medical History:  Diagnosis Date  . Anxiety 2016  . Herpes simplex virus (HSV) infection   . VBAC, delivered    Current Outpatient Medications  Medication Instructions  . dicyclomine (BENTYL) 20 mg, Oral, 4 times daily PRN  . medroxyPROGESTERone (DEPO-PROVERA) 150 mg, Intramuscular, Every 3 months   Past Surgical History:  Procedure Laterality Date  . CESAREAN SECTION  01/18/2016    Family History  Family history unknown: Yes    Social History   Tobacco Use  . Smoking status: Never Smoker  . Smokeless tobacco: Never Used  Substance Use Topics  . Alcohol use: No  . Drug use: No    Review of Systems:   Review of Systems  Constitutional: Negative for chills and fever.  HENT: Negative for congestion and sore throat.   Eyes: Negative for blurred vision and double vision.  Respiratory: Negative for cough and shortness of breath.   Cardiovascular: Positive for leg swelling (with activity). Negative for chest pain and palpitations.  Gastrointestinal: Positive for abdominal pain and heartburn. Negative for blood in stool, constipation, diarrhea, nausea and vomiting.  Genitourinary: Negative for dysuria and hematuria.  Musculoskeletal: Positive for back pain and myalgias. Negative for neck pain.  Skin: Negative for itching and rash.  Neurological: Negative for dizziness and headaches.     Physical Exam: Vitals:   03/30/20 1320  BP: 121/61  Pulse: 93  Temp: 99.4 F (37.4 C)  TempSrc: Oral  SpO2: 97%  Weight: 215 lb 9.6 oz (97.8 kg)  Height: 5\' 3"  (1.6 m)   Constitutional: no acute distress Head: atraumatic ENT: external ears normal Cardiovascular: regular  rate and rhythm, normal heart sounds Pulmonary: effort normal, normal breath sounds bilaterally Abdominal: flat, nontender, no rebound tenderness, bowel sounds decreased Musculoskeletal: diffuse mild swelling to bilateral hands distal to the wrists, mild diffuse tenderness, no particular fluid collection or tenderness to any joint, has artificial nails, able to make fists but reports pain when doing this Skin: warm and dry, no lesions on shins at this time Neurological: alert, no focal deficit Psychiatric: normal mood and affect  Assessment & Plan:   See Encounters Tab for problem based charting.  Patient seen with Dr. 

## 2020-03-30 NOTE — Assessment & Plan Note (Signed)
-  TDAP deferred to future visit -counseled patient on the COVID vaccine, she will consider getting it

## 2020-03-30 NOTE — Assessment & Plan Note (Addendum)
Reports intermittent pain over the past year which she describes as abdominal pain, but is really bilateral flanks and back. Has had multiple ED presentations for this. ED in May 2021 demonstrated terminal ileum edema, in the setting of possible erythema nodosum and arthritis, but colonoscopy in July 2021 positive only for diverticuli. Started on dicyclomine by GI, but denies any improvement with that. Reports 3-4 BMs weekly which is normal for her. Uses marijuana 3x daily. Denies n/v. Has had 2 pregnancies previously and 1 C-section.   -pregnancy test -counseled on marijuana cessation -f/u rheum labs

## 2020-03-30 NOTE — Assessment & Plan Note (Addendum)
Started 4 years ago while she was in high school, denies any trauma or repetitive movements at that time. Reports it was intermittent and stabbing in quality at first, but is now constant. 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 Has increased pain when closing her hands. Some improvement when running under warm water. Sometimes feels like they lock up. Also reports painful spots that look like bruises appear on her shins 5-6 times a month. Denies other joint pain, Raynaud's phenomenon, fever, chills, rashes, photosensitivity, SOB. Has presented to ED for this several times, workup significant for negative L hand Xray and a positive ESR at 25. CRP and fecal calprotectin negative at that time.  Also has chronic abd pain for 1 year with CT demonstrating edema of the terminal ileum, but had a negative colonoscopy.  -suspect rheumatologic etiology - checking CBC, ANA, RF, CCP, HepC, ESR, CRP -consider rheum referral after obtaining lab results  Addendum: labs returned with negative ANA, RF, CCP, HepC, ESR has normalized, CRP, normal CBC. Ordering CXR to look for sarcoid disease, will refer to rheum if negative.

## 2020-03-30 NOTE — Assessment & Plan Note (Signed)
Reports burning epigastric pain after eating at times.  -start protonix 20mg  daily

## 2020-03-31 LAB — HCG, SERUM, QUALITATIVE: hCG,Beta Subunit,Qual,Serum: NEGATIVE m[IU]/mL (ref <6)

## 2020-03-31 NOTE — Progress Notes (Signed)
Internal Medicine Clinic Attending  I saw and evaluated the patient.  I personally confirmed the key portions of the history and exam documented by Dr. Bridgett Larsson and I reviewed pertinent patient test results.  The assessment, diagnosis, and plan were formulated together and I agree with the documentation in the resident's note.  Chronic pain of bilateral MCP and PIP joints with notable soft tissue swelling of the hands but no clear synovitis on exam. Also has intermittent abdominal/back/flank pain and had inflammation of the terminal ileum on prior CT, mildly elevated ESR to 25. She describes what sounds like erythema nodosum at times, not evident on exam today.   She has been evaluated by GI with colonoscopy, no evidence of IBD at that time. Will check inflammatory markers again, as well as ANA, RF, CCP, HepC, HIV. If all negative, sarcoidosis would be a consideration as well, could check CXR next. At some point, may benefit from rheumatology evaluation.  Also discussed contraception, encouraged her to return to her GYN to discuss IUD vs other options.  Lenice Pressman, M.D., Ph.D.

## 2020-04-01 ENCOUNTER — Telehealth: Payer: Self-pay | Admitting: Student

## 2020-04-01 DIAGNOSIS — Z309 Encounter for contraceptive management, unspecified: Secondary | ICD-10-CM | POA: Insufficient documentation

## 2020-04-01 LAB — CBC WITH DIFFERENTIAL/PLATELET
Basophils Absolute: 0 10*3/uL (ref 0.0–0.2)
Basos: 1 %
EOS (ABSOLUTE): 0 10*3/uL (ref 0.0–0.4)
Eos: 1 %
Hematocrit: 38.6 % (ref 34.0–46.6)
Hemoglobin: 12.7 g/dL (ref 11.1–15.9)
Immature Grans (Abs): 0 10*3/uL (ref 0.0–0.1)
Immature Granulocytes: 0 %
Lymphocytes Absolute: 1.6 10*3/uL (ref 0.7–3.1)
Lymphs: 27 %
MCH: 28.7 pg (ref 26.6–33.0)
MCHC: 32.9 g/dL (ref 31.5–35.7)
MCV: 87 fL (ref 79–97)
Monocytes Absolute: 0.4 10*3/uL (ref 0.1–0.9)
Monocytes: 6 %
Neutrophils Absolute: 4.1 10*3/uL (ref 1.4–7.0)
Neutrophils: 65 %
Platelets: 383 10*3/uL (ref 150–450)
RBC: 4.43 x10E6/uL (ref 3.77–5.28)
RDW: 12.8 % (ref 11.7–15.4)
WBC: 6.2 10*3/uL (ref 3.4–10.8)

## 2020-04-01 LAB — HEPATITIS C ANTIBODY: Hep C Virus Ab: <0.1 s/co ratio (ref 0.0–0.9)

## 2020-04-01 LAB — ANTINUCLEAR ANTIBODIES, IFA: ANA Titer 1: NEGATIVE

## 2020-04-01 LAB — C-REACTIVE PROTEIN: CRP: 1 mg/L (ref 0–10)

## 2020-04-01 LAB — SEDIMENTATION RATE: Sed Rate: 21 mm/hr (ref 0–32)

## 2020-04-01 LAB — CYCLIC CITRUL PEPTIDE ANTIBODY, IGG/IGA: Cyclic Citrullin Peptide Ab: 3 units (ref 0–19)

## 2020-04-01 LAB — RHEUMATOID FACTOR: Rheumatoid fact SerPl-aCnc: <10 IU/mL (ref 0.0–13.9)

## 2020-04-01 LAB — HIV ANTIBODY (ROUTINE TESTING W REFLEX): HIV Screen 4th Generation wRfx: NONREACTIVE

## 2020-04-01 NOTE — Addendum Note (Signed)
Addended by: Remo Lipps on: 04/01/2020 09:13 AM   Modules accepted: Orders

## 2020-04-01 NOTE — Progress Notes (Signed)
Called patient to discuss her lab results, which were all within normal limits. This includes pregnancy test, CBC, CRP, ESR, ANA, RF, CCP, hepC, HIV. Patient agreeable to CXR, this has been ordered and she will call to schedule it. Also agreeable to possible rheumatology referral in the future.  All questions answered and patient expressed understanding.

## 2020-04-01 NOTE — Assessment & Plan Note (Signed)
Patient has been on Depo-Provera administered by her OBGYN, but her GI specialist thought it might be related to her abdominal pain. She has postponed her most recent injection for about a month, has not noticed any change in her abdominal pain. She is open to resuming Depo-Provera with her OBGYN, but is also open to other methods. Spoke with patient about IUDs which may be better because they release hormones more locally.  -patient will consider her contraceptive options and see her OBGYN

## 2020-04-30 ENCOUNTER — Encounter: Payer: Self-pay | Admitting: Internal Medicine

## 2020-04-30 ENCOUNTER — Ambulatory Visit (INDEPENDENT_AMBULATORY_CARE_PROVIDER_SITE_OTHER): Payer: Medicaid Other | Admitting: Internal Medicine

## 2020-04-30 ENCOUNTER — Other Ambulatory Visit: Payer: Self-pay

## 2020-04-30 VITALS — BP 111/80 | HR 83 | Temp 98.6°F | Ht 63.0 in | Wt 216.0 lb

## 2020-04-30 DIAGNOSIS — M79642 Pain in left hand: Secondary | ICD-10-CM | POA: Diagnosis not present

## 2020-04-30 DIAGNOSIS — M79641 Pain in right hand: Secondary | ICD-10-CM

## 2020-04-30 MED ORDER — PREDNISONE 10 MG PO TABS: 40.0000 mg | ORAL_TABLET | Freq: Every day | ORAL | 0 refills | Status: DC

## 2020-05-01 NOTE — Assessment & Plan Note (Signed)
Lab results from last OV reviewed with pt. Oddly, on last visit she had complained of pain with closing her hand according to Dr. Nicky Pugh note, in comparison to today, when she said she had more pain with opening her hand. Diagnosis remains unclear. Some autoimmune processes are less likely based on the normal ANA, CCP, RF, and inflammatory markers that were obtained at last OV.  It is interesting that she spoke about pain with opening her hands as well because it does seem that the skin is thickened on her palms. I question a diagnosis of scleroderma. We discussed the risks involved with chronic opioid use and she was agreeable to hold off on this at this time. Regarding the note she is requesting saying that she can not apply for jobs, I discussed with her that I would not write this at this time. Upon entering the room, patient was texting on her phone and was actually too caught up with this to have a discussion with me. This was too the extent that I had to ask her to put her phone down. She appeared to be comfortable while texting. Additionally, as she noted at today's appointment, the pain is worsened with opening her hand. With these things together, I do not think that signature writing should result in significant discomfort to the extent of needing a letter saying she can not do so for that reason. Plan --will give her a 5d course of steroids --referral placed to reheumatology for further workup if deemed appropriate --will hold off on obtaining scleroderma labs as rheumatology will be better fit to evaluate the appropriateness of this

## 2020-05-01 NOTE — Progress Notes (Signed)
Office Visit   Patient ID: Phyllis Kidd, female    DOB: Phyllis Kidd, 24 y.o.   MRN: [id number]  Subjective:  CC: follow up for hand pain  HPI 24 y.o. presents today for the above.   IOV was on 8/25 with Dr. Bridgett Larsson. Chart reviewed from that encounter. ANA, RF, CCP, ESR, CRP, CBC were ordered at that time and returned normal.  She has also had xrays of her bilateral wrists and left hand obtained 2010-present which have not revealed an arthropathy.  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 At this encounter, she notes more pain with opening her hand. With Dr. Bridgett Larsson, she had worse pain with closing her hand. Other than that, no significant change in symptoms.  ACTIVE MEDICATIONS   Current Outpatient Medications on File Prior to Visit  Medication Sig Dispense Refill  . dicyclomine (BENTYL) 10 MG capsule Take 2 capsules (20 mg total) by mouth 4 (four) times daily as needed for spasms (pain). 90 capsule 3  . medroxyPROGESTERone (DEPO-PROVERA) 150 MG/ML injection Inject 1 mL (150 mg total) into the muscle every 3 (three) months. 1 mL 3  . pantoprazole (PROTONIX) 20 MG tablet Take 1 tablet (20 mg total) by mouth daily. 30 tablet 1   Current Facility-Administered Medications on File Prior to Visit  Medication Dose Route Frequency Provider Last Rate Last Admin  . 0.9 %  sodium chloride infusion  500 mL Intravenous Once [place], MD        ROS  Review of Systems  Constitutional: Negative for fatigue and fever.  Respiratory: Negative for cough and shortness of breath.   Cardiovascular: Negative for leg swelling.  Musculoskeletal: Positive for arthralgias.  Skin: Negative for color change and rash.  Neurological: Positive for weakness. Negative for numbness.    Objective:   BP 111/80 (BP Location: Left Arm, Patient  Position: Sitting, Cuff Size: Large)   Pulse 83   Temp 98.6 F (37 C) (Oral)   Ht '5\' 3"'  (1.6 m)   Wt 216 lb (98 kg)   SpO2 100% Comment: room air  BMI 38.26 kg/m  Wt Readings from Last 3 Encounters:  04/30/20 216 lb (98 kg)  03/30/20 215 lb 9.6 oz (97.8 kg)  03/18/20 207 lb (93.9 kg)   BP Readings from Last 3 Encounters:  04/30/20 111/80  03/30/20 121/61  03/18/20 106/66   Physical Exam Constitutional:      Appearance: Normal appearance.  Cardiovascular:     Rate and Rhythm: Normal rate and regular rhythm.  Pulmonary:     Effort: Pulmonary effort is normal.     Breath sounds: Normal breath sounds.  Musculoskeletal:     Comments: Bilateral hands: diffuse tenderness to palpation over the wrists and hand joints as well as the palm of her hands. No apparent joint swelling. ROM of hand and wrist joints intact bilaterally. Sensation intact bilaterally. Palms appears abnormally pale with thickened skin bilaterally.  Skin:    Comments: Skin thickening over the palms. Not appreciable on arms or legs. No other significant findings--no rash or nodules.     Health Maintenance:   Health Maintenance  Topic Date Due  . COVID-19 Vaccine (1) Never done  . TETANUS/TDAP  Never done  . INFLUENZA VACCINE  03/08/2020  . CHLAMYDIA SCREENING  03/27/2020  . PAP-Cervical Cytology Screening  03/26/2022  . PAP SMEAR-Modifier  03/26/2022  . Hepatitis C Screening  Completed  . HIV Screening  Completed     Assessment & Plan:   Problem List Items Addressed This Visit      Other   Bilateral hand pain - Primary    Lab results from last OV reviewed with pt. Oddly, on last visit she had complained of pain with closing her hand according to Dr. Lianne Moris note, in comparison to today, when she said she had more pain with opening her hand. Diagnosis remains unclear. Some autoimmune processes are less likely based on the normal ANA, CCP, RF, and inflammatory markers that were obtained at last OV.  It  is interesting that she spoke about pain with opening her hands as well because it does seem that the skin is thickened on her palms. I question a diagnosis of scleroderma. We discussed the risks involved with chronic opioid use and she was agreeable to hold off on this at this time. Regarding the note she is requesting saying that she can not apply for jobs, I discussed with her that I would not write this at this time. Upon entering the room, patient was texting on her phone and was actually too caught up with this to have a discussion with me. This was too the extent that I had to ask her to put her phone down. She appeared to be comfortable while texting. Additionally, as she noted at today's appointment, the pain is worsened with opening her hand. With these things together, I do not think that signature writing should result in significant discomfort to the extent of needing a letter saying she can not do so for that reason. Plan --will give her a 5d course of steroids --referral placed to reheumatology for further workup if deemed appropriate --will hold off on obtaining scleroderma labs as rheumatology will be better fit to evaluate the appropriateness of this      Relevant Orders   Ambulatory referral to Rheumatology        Pt discussed with Dr. Adolm Joseph, MD Internal Medicine Resident PGY-2 [facility] Pager: [phone] 05/01/2020 1:54 PM

## 2020-05-04 NOTE — Progress Notes (Signed)
Internal Medicine Clinic Attending  Case discussed with Dr. Christian  At the time of the visit.  We reviewed the resident's history and exam and pertinent patient test results.  I agree with the assessment, diagnosis, and plan of care documented in the resident's note.  

## 2020-05-06 ENCOUNTER — Other Ambulatory Visit: Payer: Self-pay | Admitting: Internal Medicine

## 2020-05-06 DIAGNOSIS — M79641 Pain in right hand: Secondary | ICD-10-CM

## 2020-05-12 DIAGNOSIS — M79641 Pain in right hand: Secondary | ICD-10-CM | POA: Diagnosis not present

## 2020-05-12 DIAGNOSIS — M79642 Pain in left hand: Secondary | ICD-10-CM | POA: Diagnosis not present

## 2020-05-25 ENCOUNTER — Other Ambulatory Visit: Payer: Self-pay | Admitting: Student

## 2020-05-25 DIAGNOSIS — Z1159 Encounter for screening for other viral diseases: Secondary | ICD-10-CM | POA: Insufficient documentation

## 2020-05-25 NOTE — Assessment & Plan Note (Signed)
Blood work drawn for one time hepatitis C screening performed on office visit with Dr. Imogene Burn on 05/24/2020

## 2020-06-25 ENCOUNTER — Telehealth: Payer: Self-pay | Admitting: *Deleted

## 2020-06-25 ENCOUNTER — Encounter: Payer: Medicaid Other | Admitting: Student

## 2020-06-25 NOTE — Telephone Encounter (Signed)
Call to patient about missed appointment on today.  Stated has been having headaches and just missed appointment.  Question about referral for hand pain.  Dr. Imogene Burn spoke with patient.  Appointment scheduled with Sports Medicine.  Angelina Ok, RN 06/25/2020 3:21 PM.

## 2020-06-30 ENCOUNTER — Other Ambulatory Visit: Payer: Self-pay

## 2020-06-30 ENCOUNTER — Ambulatory Visit (INDEPENDENT_AMBULATORY_CARE_PROVIDER_SITE_OTHER): Payer: Medicaid Other | Admitting: Sports Medicine

## 2020-06-30 VITALS — BP 112/72 | Ht 63.0 in | Wt 217.0 lb

## 2020-06-30 DIAGNOSIS — M67432 Ganglion, left wrist: Secondary | ICD-10-CM

## 2020-06-30 NOTE — Patient Instructions (Addendum)
We are going to refer you to a hand surgeon to discuss removal of the ganglion cyst on your left wrist.  Dr. Cindee Salt [facility] [address], [place] [address] [phone]  Appt: 07/10/20 @ 9 am, please arrive at 8:45 am.

## 2020-06-30 NOTE — Progress Notes (Signed)
   Subjective:    Patient ID: Phyllis Kidd, female    DOB: [birth year], 24 y.o.   MRN: [id number]  HPI   Very pleasant right-hand-dominant 24 year old female comes in today with a returning left wrist dorsal ganglion cyst.  It was aspirated and injected in our office back in August.  This helped for about a month.  Symptoms all began several weeks ago after she injured her wrist.  She is interested in discussing surgical excision.   Review of Systems As above    Objective:   Physical Exam  Well-developed, well-nourished.  Left wrist: Full range of motion.  Obvious fluctuant dorsal ganglion cyst  Minimally tender.  Good pulses.      Assessment & Plan:   Left wrist ganglion cyst  Patient will be referred for possible surgical excision.  Surgeons may opt for a preoperative MRI first given the traumatic nature of her symptoms.  I will defer further work-up and treatment to their discretion and the patient will follow up with me as needed.

## 2020-07-10 DIAGNOSIS — M79642 Pain in left hand: Secondary | ICD-10-CM | POA: Diagnosis not present

## 2020-07-10 DIAGNOSIS — M67432 Ganglion, left wrist: Secondary | ICD-10-CM | POA: Diagnosis not present

## 2020-07-14 ENCOUNTER — Other Ambulatory Visit: Payer: Self-pay | Admitting: Orthopedic Surgery

## 2020-08-11 ENCOUNTER — Ambulatory Visit (HOSPITAL_BASED_OUTPATIENT_CLINIC_OR_DEPARTMENT_OTHER): Admit: 2020-08-11 | Payer: Medicaid Other | Admitting: Orthopedic Surgery

## 2020-08-11 ENCOUNTER — Encounter (HOSPITAL_BASED_OUTPATIENT_CLINIC_OR_DEPARTMENT_OTHER): Payer: Self-pay

## 2020-08-11 SURGERY — EXCISION, GANGLION CYST, WRIST
Anesthesia: Monitor Anesthesia Care | Site: Wrist | Laterality: Left

## 2020-08-31 DIAGNOSIS — Z20822 Contact with and (suspected) exposure to covid-19: Secondary | ICD-10-CM | POA: Diagnosis not present

## 2020-09-03 DIAGNOSIS — M67432 Ganglion, left wrist: Secondary | ICD-10-CM | POA: Diagnosis not present

## 2020-09-11 DIAGNOSIS — M79642 Pain in left hand: Secondary | ICD-10-CM | POA: Diagnosis not present

## 2020-09-11 DIAGNOSIS — M67432 Ganglion, left wrist: Secondary | ICD-10-CM | POA: Diagnosis not present

## 2020-09-17 ENCOUNTER — Emergency Department (HOSPITAL_COMMUNITY): Payer: Medicaid Other

## 2020-09-17 ENCOUNTER — Encounter (HOSPITAL_COMMUNITY): Payer: Self-pay | Admitting: Emergency Medicine

## 2020-09-17 ENCOUNTER — Emergency Department (HOSPITAL_COMMUNITY)
Admission: EM | Admit: 2020-09-17 | Discharge: 2020-09-17 | Disposition: A | Discharge status: Home / Self Care | Payer: Medicaid Other | Attending: Emergency Medicine | Admitting: Emergency Medicine

## 2020-09-17 DIAGNOSIS — R000 Tachycardia, unspecified: Secondary | ICD-10-CM | POA: Diagnosis not present

## 2020-09-17 DIAGNOSIS — R0602 Shortness of breath: Secondary | ICD-10-CM | POA: Diagnosis not present

## 2020-09-17 DIAGNOSIS — U071 COVID-19: Secondary | ICD-10-CM | POA: Diagnosis not present

## 2020-09-17 DIAGNOSIS — R064 Hyperventilation: Secondary | ICD-10-CM | POA: Diagnosis not present

## 2020-09-17 DIAGNOSIS — R0689 Other abnormalities of breathing: Secondary | ICD-10-CM | POA: Diagnosis not present

## 2020-09-17 DIAGNOSIS — Z20828 Contact with and (suspected) exposure to other viral communicable diseases: Secondary | ICD-10-CM | POA: Diagnosis not present

## 2020-09-17 DIAGNOSIS — R630 Anorexia: Secondary | ICD-10-CM | POA: Diagnosis not present

## 2020-09-17 LAB — RESP PANEL BY RT-PCR (FLU A&B, COVID) ARPGX2
Influenza A by PCR: NEGATIVE
Influenza B by PCR: NEGATIVE
SARS Coronavirus 2 by RT PCR: POSITIVE — AB

## 2020-09-17 MED ORDER — AEROCHAMBER PLUS FLO-VU MEDIUM MISC
1.0000 | Freq: Once | Status: DC
  Filled 2020-09-17: qty 1

## 2020-09-17 MED ORDER — ALBUTEROL SULFATE HFA 108 (90 BASE) MCG/ACT IN AERS
2.0000 | INHALATION_SPRAY | Freq: Once | RESPIRATORY_TRACT | Status: AC
  Administered 2020-09-17: 2 via RESPIRATORY_TRACT
  Filled 2020-09-17: qty 6.7

## 2020-09-17 MED ORDER — ACETAMINOPHEN 500 MG PO TABS
1000.0000 mg | ORAL_TABLET | Freq: Once | ORAL | Status: AC
  Administered 2020-09-17: 1000 mg via ORAL
  Filled 2020-09-17: qty 2

## 2020-09-17 NOTE — Discharge Instructions (Signed)
You may use your inhaler by taking 2 puffs every 4 hours as needed.   Please take Ibuprofen (Advil, motrin) and Tylenol (acetaminophen) to relieve your pain.    You may take up to 600 MG (3 pills) of normal strength ibuprofen every 8 hours as needed.   You make take tylenol, up to 1,000 mg (two extra strength pills) every 8 hours as needed.   It is safe to take ibuprofen and tylenol at the same time as they work differently.   Do not take more than 3,000 mg tylenol in a 24 hour period (not more than one dose every 8 hours.  Please check all medication labels as many medications such as pain and cold medications may contain tylenol.  Do not drink alcohol while taking these medications.  Do not take other NSAID'S while taking ibuprofen (such as aleve or naproxen).  Please take ibuprofen with food to decrease stomach upset.

## 2020-09-17 NOTE — ED Notes (Signed)
Pt transported to room 9 at this time

## 2020-09-17 NOTE — ED Provider Notes (Signed)
[facility] EMERGENCY DEPARTMENT Provider Note   CSN: [id number] Arrival date & time: 09/17/20  1444     History Chief Complaint  Patient presents with  . Generalized Body Aches    Phyllis Kidd is a 25 y.o. female who presents today for evaluation of a few days of shortness of breath and generalized body aches.  She did go see her sister a few days ago, one of the family members had Covid however she reports she stayed outside.  She then began developing symptoms the next day.  She reports she has had poor appetite however denies nausea, vomiting, or diarrhea..  She denies any leg swelling.  She chose not to get vaccinated against Covid.  She has not had Covid before.  She denies history of PE/DVT.  No history of asthma.  She reports that she feels generally shaky.   She reports that she primarily feels short of breath with body pain.  She states that her pain is not any worse than the rest of her body and her chest abdomen or anywhere else rather hurts equally throughout her entire body.   HPI     Past Medical History:  Diagnosis Date  . Anxiety 2016  . Herpes simplex virus (HSV) infection   . VBAC, delivered     Patient Active Problem List   Diagnosis Date Noted  . Need for hepatitis C screening test 05/25/2020  . Contraception management 04/01/2020  . Bilateral hand pain 03/30/2020  . GERD (gastroesophageal reflux disease) 03/30/2020  . Abdominal pain 03/30/2020  . Healthcare maintenance 03/30/2020  . 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 CESAREAN SECTION  01/18/2016     OB History    Gravida  2   Para  2   Term  2   Preterm  0   AB  0   Living  2     SAB  0   IAB  0   Ectopic  0   Multiple  0   Live Births  2           Family History  Family history unknown: Yes    Social History   Tobacco Use  . Smoking status: Never Smoker  .  Smokeless tobacco: Never Used  . Tobacco comment: Marijuana 1 x a daily  Substance Use Topics  . Alcohol use: Yes    Comment: social  . Drug use: Yes    Types: Marijuana    Comment: 3 times daily    Home Medications Prior to Admission medications   Medication Sig Start Date End Date Taking? Authorizing Provider  dicyclomine (BENTYL) 10 MG capsule Take 2 capsules (20 mg total) by mouth 4 (four) times daily as needed for spasms (pain). 03/04/20   Tressia Danas, MD  medroxyPROGESTERone (DEPO-PROVERA) 150 MG/ML injection Inject 1 mL (150 mg total) into the muscle every 3 (three) months. 06/12/19   Conan Bowens, MD  pantoprazole (PROTONIX) 20 MG tablet Take 1 tablet (20 mg total) by mouth daily. 03/30/20 05/29/20  Remo Lipps, MD  predniSONE (DELTASONE) 10 MG tablet Take 4 tablets (40 mg total) by mouth daily with breakfast. 04/30/20   Elige Radon, MD    Allergies    Patient has no known allergies.  Review of Systems   Review of Systems  Constitutional: Positive for appetite change, chills and fatigue. Negative for fever.  HENT: Negative for  trouble swallowing.   Respiratory: Positive for cough and shortness of breath.   Cardiovascular: Positive for chest pain (Chest doesn't hurt more than the rest of her body. Non localized. ).  Gastrointestinal: Negative for abdominal pain, diarrhea, nausea and vomiting.  Musculoskeletal: Positive for arthralgias and myalgias.  Skin: Negative for color change and rash.  Neurological: Positive for headaches. Negative for numbness.  Psychiatric/Behavioral: Negative for confusion.  All other systems reviewed and are negative.   Physical Exam Updated Vital Signs BP 128/76   Pulse 70   Temp 98.3 F (36.8 C) (Oral)   Resp (!) 30   SpO2 99%   Physical Exam Vitals and nursing note reviewed.  Constitutional:      General: She is not in acute distress.    Appearance: She is not ill-appearing.  HENT:     Head: Normocephalic and  atraumatic.  Eyes:     Conjunctiva/sclera: Conjunctivae normal.  Cardiovascular:     Rate and Rhythm: Normal rate.     Pulses: Normal pulses.     Heart sounds: Normal heart sounds.  Pulmonary:     Effort: Pulmonary effort is normal. No respiratory distress.     Breath sounds: No stridor. Wheezing (Minimal, bilateral) present. No rhonchi.  Abdominal:     General: There is no distension.  Musculoskeletal:     Cervical back: Normal range of motion and neck supple.     Right lower leg: No edema.     Left lower leg: No edema.     Comments: No obvious acute injury  Skin:    General: Skin is warm.  Neurological:     Mental Status: She is alert.     Comments: Awake and alert, answers all questions appropriately.  Speech is not slurred.  Psychiatric:        Mood and Affect: Mood normal.        Behavior: Behavior normal.     ED Results / Procedures / Treatments   Labs (all labs ordered are listed, but only abnormal results are displayed) Labs Reviewed  RESP PANEL BY RT-PCR (FLU A&B, COVID) ARPGX2 - Abnormal; Notable for the following components:      Result Value   SARS Coronavirus 2 by RT PCR POSITIVE (*)    All other components within normal limits    EKG EKG Interpretation  Date/Time:  Thursday September 17 2020 14:58:06 EST Ventricular Rate:  83 PR Interval:  150 QRS Duration: 84 QT Interval:  346 QTC Calculation: 406 R Axis:   60 Text Interpretation: Normal sinus rhythm Nonspecific T wave abnormality Abnormal ECG  no acute ischemic changes Confirmed by Arby Barrette [phone]) on 09/17/2020 9:05:35 PM   Radiology DG Chest Port 1 View  Result Date: 09/17/2020 CLINICAL DATA:  COVID like symptoms, shortness of breath EXAM: PORTABLE CHEST 1 VIEW COMPARISON:  CT 01/25/2016 FINDINGS: No consolidation, features of edema, pneumothorax, or effusion. Pulmonary vascularity is normally distributed. The cardiomediastinal contours are unremarkable. No acute osseous or soft tissue  abnormality. IMPRESSION: No acute cardiopulmonary abnormality. Electronically Signed   By: Kreg Shropshire M.D.   On: 09/17/2020 15:38    Procedures Procedures   Medications Ordered in ED Medications  AeroChamber Plus Flo-Vu Medium MISC 1 each (has no administration in time range)  albuterol (VENTOLIN HFA) 108 (90 Base) MCG/ACT inhaler 2 puff (2 puffs Inhalation Given 09/17/20 2003)  acetaminophen (TYLENOL) tablet 1,000 mg (1,000 mg Oral Given 09/17/20 2001)    ED Course  I have reviewed the  triage vital signs and the nursing notes.  Pertinent labs & imaging results that were available during my care of the patient were reviewed by me and considered in my medical decision making (see chart for details).    MDM Rules/Calculators/A&P                         Patient is a 25 year old who presents today for evaluation of myalgias, shortness of breath, and generally feeling unwell. She is unvaccinated against Covid.  Here her Covid test is positive.  Chest x-ray was obtained, lungs are without evidence of consolidation, pneumothorax or pneumonia bilaterally.  She did have some slight wheezes, was treated with albuterol which improved her symptoms.  Heart chest she does report hurts however it does not hurt any more than the rest of her body and is the entire chest.  She is not currently using any hormones, no leg swelling, is afebrile, not tachycardic or tachypneic.  She was given Tylenol after which she felt slightly better.  PERC negative.  Doubt ACS, EKG is nonischemic.  I personally had patient ambulate.  She was tremulous and appeared to feel unwell however her vital signs remained stable during this time.  Ambulatory referral placed for consideration for Covid treatment.  Phyllis Kidd was evaluated in Emergency Department on 09/17/2020 for the symptoms described in the history of present illness. She was evaluated in the context of the global COVID-19 pandemic, which necessitated  consideration that the patient might be at risk for infection with the SARS-CoV-2 virus that causes COVID-19. Institutional protocols and algorithms that pertain to the evaluation of patients at risk for COVID-19 are in a state of rapid change based on information released by regulatory bodies including the CDC and federal and state organizations. These policies and algorithms were followed during the patient's care in the ED.  Recommended symptomatic care.   Return precautions were discussed with patient who states their understanding.  At the time of discharge patient denied any unaddressed complaints or concerns.  Patient is agreeable for discharge home.  Note: Portions of this report may have been transcribed using voice recognition software. Every effort was made to ensure accuracy; however, inadvertent computerized transcription errors may be present   Final Clinical Impression(s) / ED Diagnoses Final diagnoses:  COVID-19    Rx / DC Orders ED Discharge Orders         Ordered    Ambulatory referral for Covid Treatment       Comments: Please consider for covid treatment.   09/17/20 2056           Cristina Gong, PA-C 09/17/20 2158    Arby Barrette, MD 09/28/20 1744

## 2020-09-17 NOTE — ED Triage Notes (Signed)
Patient here with complaint of shortness of breath and generalized body aches for the last few days. Patient is not vaccinated against COVID, unsure of sick contacts. SpO2 97% on room air.

## 2020-09-17 NOTE — ED Notes (Signed)
Patient BIB [facility] for shortness of breath, patient hyperventilating on EMS arrival to patient's home. Denies history of panic attacks, unknown if has sick contacts.

## 2020-09-17 NOTE — ED Notes (Signed)
Pt not in room 9 at this time

## 2020-09-18 ENCOUNTER — Telehealth: Payer: Self-pay | Admitting: Family

## 2020-09-18 NOTE — Telephone Encounter (Signed)
Called to discuss with patient about COVID-19 symptoms and the use of one of the available treatments for those with mild to moderate Covid symptoms and at a high risk of hospitalization.  Pt appears to qualify for outpatient treatment due to co-morbid conditions and/or a member of an at-risk group in accordance with the FDA Emergency Use Authorization.    Symptom onset:  Vaccinated:No Booster? No Immunocompromised? No Qualifiers: BMI 38 and Elevated SVI score  Phyllis Kidd was seen in the [facility] ED on 2/10 with shortness of breath and generalized body aches. Covid test was positive. She is unvaccinated.   I called Ms. Lorenz and was unable to reach her via phone. No voicemail picked up. A Mychart message was sent with call back number. She appears to qualify for Sotrovimab.   Marcos Eke, NP 09/18/2020 10:24 AM

## 2020-09-29 ENCOUNTER — Ambulatory Visit: Payer: Medicaid Other | Admitting: Sports Medicine

## 2020-10-13 ENCOUNTER — Encounter: Payer: Medicaid Other | Admitting: Student

## 2020-11-03 ENCOUNTER — Encounter: Payer: Medicaid Other | Admitting: Student

## 2020-11-03 NOTE — Patient Instructions (Incomplete)
Phyllis Kidd,   Thank you for your visit to the [facility] today. It was a pleasure seeing you. Today we discussed the following:  1) xyz  2) xyz  3) xyz   We would like to see you back in xyz. Please bring all of your medications with you.   If you have any questions or concerns, please call our clinic at [phone] between 9am-5pm. Outside of these hours, call [phone] and ask for the internal medicine resident on call. If you feel you are having a medical emergency please call 911.

## 2020-11-03 NOTE — Progress Notes (Deleted)
   CC: ***  HPI:  [name] is a 25 y.o. {GC/GE:3044014::"man","woman"} with history as below who presents to clinic for ***. {GC/GE:3044014::"His","Her"} last clinic visit was on ***.   To see the details of this patient's management of their acute and chronic problems, please refer to the Assessment & Plan under the Encounters tab.    Past Medical History:  Diagnosis Date  . Anxiety 2016  . Herpes simplex virus (HSV) infection   . VBAC, delivered    Review of Systems:    ROS  Physical Exam:  There were no vitals filed for this visit. Constitutional: well-appearing {GC/GE:3044014::"man","woman"} sitting in chair, in no acute distress HENT: normocephalic atraumatic, mucous membranes moist Eyes: conjunctiva non-erythematous Neck: supple Cardiovascular: regular rate and rhythm, no m/r/g Pulmonary/Chest: normal work of breathing on room air, lungs clear to auscultation bilaterally Abdominal: soft, non-tender, non-distended MSK: normal bulk and tone Neurological: alert & oriented x 3, 5/5 strength in bilateral upper and lower extremities, normal gait Skin: warm and dry*** Psych: ***    Assessment & Plan:   See Encounters Tab for problem-based charting.  Patient {GC/GE:3044014::"discussed with","seen with"} Dr. {NAMES:3044014::"Williams","Guilloud","Hoffman","Mullen","Narendra","Raines","Vincent"}

## 2020-11-23 ENCOUNTER — Encounter: Payer: Self-pay | Admitting: Internal Medicine

## 2020-11-23 ENCOUNTER — Ambulatory Visit (INDEPENDENT_AMBULATORY_CARE_PROVIDER_SITE_OTHER): Payer: Medicaid Other | Admitting: Internal Medicine

## 2020-11-23 VITALS — BP 131/75 | HR 66 | Temp 98.1°F | Ht 63.0 in | Wt 218.4 lb

## 2020-11-23 DIAGNOSIS — M79642 Pain in left hand: Secondary | ICD-10-CM | POA: Diagnosis not present

## 2020-11-23 DIAGNOSIS — N911 Secondary amenorrhea: Secondary | ICD-10-CM | POA: Insufficient documentation

## 2020-11-23 DIAGNOSIS — M79641 Pain in right hand: Secondary | ICD-10-CM | POA: Diagnosis not present

## 2020-11-23 DIAGNOSIS — G5600 Carpal tunnel syndrome, unspecified upper limb: Secondary | ICD-10-CM | POA: Diagnosis not present

## 2020-11-23 NOTE — Assessment & Plan Note (Signed)
Patient is following up for bilateral hand pain that has been ongoing for about 5 years.  She is recently seen by orthopedics for removal of the left wrist ganglion cyst.  She states has not helped alleviate her pain at all.  Surgical site looks clean without signs of infection.  Past rheumatologic work-up has been negative.  She is not sure if she has he seen a rheumatologist.  She states pain started about 4 to 5 years ago when she was writing and noticed that she kept dropping her pencil.  She is right-handed.  She states that the pain, tenderness numbness and tingling has progressed.  She requires help with family members for her ADLs.  She states that she has to continuously move her hands or else they become too stiff.  She has tried wrist splints with minimal improvement.  She is also tried over-the-counter medications like Tylenol ibuprofen and heating pads with little relief.  Denies any injury or trauma to her hands wrist or cervical spine.  Of note, she did mention skin bruising on bilateral lower extremities that she noticed this time to time that is extremely tender to palpation.  Nothing on exam today.  On exam range of motion was within normal limits however pain was reproduced with both flexion and extension.  Strength was 5 out of 5.  Sensation was decreased to both sharp and dull touch.  Tinel's sign and Phalen sign both positive, reproduced pain and numbness.  She was severely tender to palpation.  Concerning for bilateral carpal tunnel syndrome versus a seronegative rheumatologic disorder.  Will order TSH and refer to hand surgery for further evaluation.  May also need to be seen by rheumatology and may also need to consider nerve conduction studies if this is not carpal tunnel syndrome.  Recommended she continue wearing wrist splints for support in the interim with the use of NSAIDs as needed.  Plan: Follow-up TSH Referral to hand surgery May need to consider nerve conduction studies  and/or referral to rheumatology for seronegative rheumatologic disorders consideration

## 2020-11-23 NOTE — Patient Instructions (Signed)
Thank you for allowing [place] to provide your care today. Today we discussed your bilateral hand pain.  I have ordered labs for you. I will call if any are abnormal.    Please follow-up in 1-2 months.    Should you have any questions or concerns please call the internal medicine clinic at [phone].

## 2020-11-23 NOTE — Assessment & Plan Note (Signed)
Patient reports that she has not had a menstrual cycle in the past 2 years.  She used to have monthly cycles prior to starting birth control.  She stopped taking birth control about 2 years ago and since that time has not had a cycle.  She started having menstruation at the age of 75 and describes it on the heavier side.  Denies any history of sexually transmitted infections.  States that she is currently sexually active with her husband only.  No vaginal pain or abnormal discharge.  Most recent Pap smear was in 2020 and normal.  We will refer her to gynecology for further evaluation.

## 2020-11-23 NOTE — Progress Notes (Signed)
   CC: Bilateral hand pain  HPI:  Phyllis Kidd is a 25 y.o. with a past medical history listed below presenting for follow-up of bilateral hand pain. Details   Past Medical History:  Diagnosis Date  . Anxiety 2016  . Herpes simplex virus (HSV) infection   . VBAC, delivered    Review of Systems:   ROS   Physical Exam:  Vitals:   11/23/20 1502  BP: 131/75  Pulse: 66  Temp: 98.1 F (36.7 C)  TempSrc: Oral  SpO2: 100%  Weight: 218 lb 6.4 oz (99.1 kg)  Height: 5\' 3"  (1.6 m)   Physical Exam Vitals reviewed.  Constitutional:      Appearance: Normal appearance.  HENT:     Head: Normocephalic and atraumatic.  Cardiovascular:     Rate and Rhythm: Normal rate and regular rhythm.  Pulmonary:     Effort: Pulmonary effort is normal.     Breath sounds: Normal breath sounds.  Musculoskeletal:        General: Tenderness present. No swelling.     Cervical back: Neck supple. No rigidity or tenderness.     Comments: Bilateral wrist and hand tenderness to palpation.  Sensation is decreased.  Range of motion within normal limits and strength 5 out of 5.  Tinel's sign and Phalen sign positive.  Skin:    General: Skin is warm and dry.  Neurological:     Mental Status: She is alert and oriented to person, place, and time.     Sensory: Sensory deficit present.     Motor: No weakness.  Psychiatric:        Mood and Affect: Mood normal.        Behavior: Behavior normal.        Thought Content: Thought content normal.        Judgment: Judgment normal.     Assessment & Plan:   See Encounters Tab for problem based charting.  Patient discussed with Dr. 

## 2020-11-24 LAB — TSH: TSH: 1.6 u[IU]/mL (ref 0.450–4.500)

## 2020-11-24 LAB — VITAMIN B12: Vitamin B-12: 271 pg/mL (ref 232–1245)

## 2020-11-26 NOTE — Progress Notes (Signed)
Internal Medicine Clinic Attending ° °Case discussed with Dr. Rehman  At the time of the visit.  We reviewed the resident’s history and exam and pertinent patient test results.  I agree with the assessment, diagnosis, and plan of care documented in the resident’s note.  ° °

## 2020-11-26 NOTE — Addendum Note (Signed)
Addended by: Jaci Standard on: 11/26/2020 03:38 PM   Modules accepted: Orders

## 2020-12-02 DIAGNOSIS — M79641 Pain in right hand: Secondary | ICD-10-CM | POA: Diagnosis not present

## 2020-12-02 DIAGNOSIS — M67432 Ganglion, left wrist: Secondary | ICD-10-CM | POA: Diagnosis not present

## 2020-12-02 DIAGNOSIS — R200 Anesthesia of skin: Secondary | ICD-10-CM | POA: Diagnosis not present

## 2020-12-02 DIAGNOSIS — R202 Paresthesia of skin: Secondary | ICD-10-CM | POA: Diagnosis not present

## 2020-12-02 DIAGNOSIS — Z4789 Encounter for other orthopedic aftercare: Secondary | ICD-10-CM | POA: Diagnosis not present

## 2020-12-02 DIAGNOSIS — M79642 Pain in left hand: Secondary | ICD-10-CM | POA: Diagnosis not present

## 2020-12-07 ENCOUNTER — Telehealth: Payer: Self-pay

## 2020-12-07 NOTE — Telephone Encounter (Signed)
Return pt's call - "call cannot be completed at this time"; unable to leave a message. 

## 2020-12-07 NOTE — Telephone Encounter (Signed)
Pt is requesting a callback [phone]

## 2020-12-10 LAB — SPECIMEN STATUS REPORT

## 2020-12-10 LAB — METHYLMALONIC ACID, SERUM: Methylmalonic Acid: 132 nmol/L (ref 0–378)

## 2020-12-14 ENCOUNTER — Telehealth: Payer: Self-pay

## 2020-12-14 NOTE — Telephone Encounter (Signed)
Pt is requesting a call back she stated , that her last ov she got a letter  For her social worker  Which she did mail off but was lost in the mail she is wanting to know if she can get  Another letter to hand deliver to her worker

## 2020-12-14 NOTE — Telephone Encounter (Signed)
RTC, informed patient the letter that Dr. Karilyn Cota wrote for her will be printed out and will be at the front desk for pick up SChaplin, RN,BSN

## 2020-12-23 DIAGNOSIS — G5603 Carpal tunnel syndrome, bilateral upper limbs: Secondary | ICD-10-CM | POA: Diagnosis not present

## 2020-12-25 DIAGNOSIS — G5603 Carpal tunnel syndrome, bilateral upper limbs: Secondary | ICD-10-CM | POA: Diagnosis not present

## 2020-12-25 DIAGNOSIS — M79642 Pain in left hand: Secondary | ICD-10-CM | POA: Diagnosis not present

## 2020-12-25 DIAGNOSIS — M67432 Ganglion, left wrist: Secondary | ICD-10-CM | POA: Diagnosis not present

## 2020-12-25 DIAGNOSIS — M79641 Pain in right hand: Secondary | ICD-10-CM | POA: Diagnosis not present

## 2021-01-20 ENCOUNTER — Encounter: Payer: Self-pay | Admitting: *Deleted

## 2021-01-25 ENCOUNTER — Encounter: Payer: Self-pay | Admitting: Family Medicine

## 2021-01-25 ENCOUNTER — Encounter: Payer: Medicaid Other | Admitting: Family Medicine

## 2021-01-25 DIAGNOSIS — G5603 Carpal tunnel syndrome, bilateral upper limbs: Secondary | ICD-10-CM | POA: Diagnosis not present

## 2021-01-25 NOTE — Addendum Note (Signed)
Addended by: Neomia Dear on: 01/25/2021 04:24 PM   Modules accepted: Orders

## 2021-01-25 NOTE — Progress Notes (Signed)
Patient did not keep appointment today. She may call to reschedule.  

## 2021-02-09 ENCOUNTER — Encounter: Payer: Self-pay | Admitting: *Deleted

## 2021-02-21 ENCOUNTER — Telehealth: Payer: Medicaid Other

## 2021-02-22 ENCOUNTER — Other Ambulatory Visit: Payer: Self-pay

## 2021-02-22 ENCOUNTER — Encounter: Payer: Self-pay | Admitting: Emergency Medicine

## 2021-02-22 ENCOUNTER — Ambulatory Visit
Admission: EM | Admit: 2021-02-22 | Discharge: 2021-02-22 | Disposition: A | Discharge status: Home / Self Care | Payer: Medicaid Other | Attending: Physician Assistant | Admitting: Physician Assistant

## 2021-02-22 DIAGNOSIS — N912 Amenorrhea, unspecified: Secondary | ICD-10-CM

## 2021-02-22 DIAGNOSIS — R1030 Lower abdominal pain, unspecified: Secondary | ICD-10-CM

## 2021-02-22 LAB — POCT URINALYSIS DIP (MANUAL ENTRY)
Bilirubin, UA: NEGATIVE
Glucose, UA: NEGATIVE mg/dL
Ketones, POC UA: NEGATIVE mg/dL
Leukocytes, UA: NEGATIVE
Nitrite, UA: NEGATIVE
Protein Ur, POC: NEGATIVE mg/dL
Spec Grav, UA: 1.025 (ref 1.010–1.025)
Urobilinogen, UA: 0.2 E.U./dL
pH, UA: 7 (ref 5.0–8.0)

## 2021-02-22 LAB — POCT URINE PREGNANCY: Preg Test, Ur: NEGATIVE

## 2021-02-22 NOTE — Discharge Instructions (Addendum)
Schedule to see Gynecology for evaluation  

## 2021-02-22 NOTE — ED Triage Notes (Signed)
Pt here with suprapubic abdominal pain. Denies urinary sx or vaginal discharge. Denies N/V/D. Also c/o left thigh pain with bruising for 1 week.

## 2021-02-23 NOTE — ED Provider Notes (Signed)
[facility]    CSN: [id number] Arrival date & time: 02/22/21  1556      History   Chief Complaint Chief Complaint  Patient presents with   Abdominal Pain   Leg Pain    HPI Phyllis Kidd is a 25 y.o. female.   The history is provided by the patient. No language interpreter was used.  Abdominal Pain Pain location:  Suprapubic Pain quality: aching   Pain radiates to:  Does not radiate Progression:  Worsening Chronicity:  New Relieved by:  Nothing Worsened by:  Nothing Ineffective treatments:  None tried Associated symptoms: no chest pain, no chills, no fever, no hematuria, no nausea, no sore throat and no vomiting   Leg Pain Associated symptoms: no fever   Pt reports no birth control.  Pt reports no period in 2 years.  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 09/11/2017    Past Surgical History:  Procedure Laterality Date   CESAREAN SECTION  01/18/2016    OB History     Gravida  2   Para  2   Term  2   Preterm  0   AB  0   Living  2      SAB  0   IAB  0   Ectopic  0   Multiple  0   Live Births  2            Home Medications    Prior to Admission medications   Not on File    Family History Family History  Family history unknown: Yes    Social History Social History   Tobacco Use   Smoking status: Never   Smokeless tobacco: Never   Tobacco comments:    Marijuana 1 x a daily  Substance Use Topics   Alcohol use: Yes    Comment: social   Drug use: Yes    Types:  Marijuana    Comment: 3 times daily     Allergies   Patient has no known allergies.   Review of Systems Review of Systems  Constitutional:  Negative for chills and fever.  HENT:  Negative for ear pain and sore throat.   Cardiovascular:  Negative for chest pain and palpitations.  Gastrointestinal:  Positive for abdominal pain. Negative for nausea and vomiting.  Genitourinary:  Negative for hematuria.  Skin:  Negative for color change and rash.  All other systems reviewed and are negative.   Physical Exam Triage Vital Signs ED Triage Vitals  Enc Vitals Group     BP 02/22/21 1642 113/78     Pulse Rate 02/22/21 1642 (!) 56     Resp 02/22/21 1642 18     Temp 02/22/21 1642 98 F (36.7 C)     Temp Source 02/22/21 1642 Oral     SpO2 02/22/21 1642 98 %     Weight --      Height --      Head Circumference --      Peak Flow --  Pain Score 02/22/21 1643 10     Pain Loc --      Pain Edu? --      Excl. in GC? --    No data found.  Updated Vital Signs BP 113/78   Pulse (!) 56   Temp 98 F (36.7 C) (Oral)   Resp 18   SpO2 98%   Visual Acuity Right Eye Distance:   Left Eye Distance:   Bilateral Distance:    Right Eye Near:   Left Eye Near:    Bilateral Near:     Physical Exam HENT:     Head: Normocephalic.     Mouth/Throat:     Mouth: Mucous membranes are moist.  Cardiovascular:     Rate and Rhythm: Normal rate.  Abdominal:     General: Abdomen is flat. Bowel sounds are normal.     Palpations: Abdomen is soft.     Tenderness: There is no abdominal tenderness.  Skin:    General: Skin is warm.  Neurological:     Mental Status: She is alert.     UC Treatments / Results  Labs (all labs ordered are listed, but only abnormal results are displayed) Labs Reviewed  POCT URINALYSIS DIP (MANUAL ENTRY) - Abnormal; Notable for the following components:      Result Value   Blood, UA moderate (*)    All other components within normal limits  POCT URINE  PREGNANCY    EKG   Radiology No results found.  Procedures Procedures (including critical care time)  Medications Ordered in UC Medications - No data to display  Initial Impression / Assessment and Plan / UC Course  I have reviewed the triage vital signs and the nursing notes.  Pertinent labs & imaging results that were available during my care of the patient were reviewed by me and considered in my medical decision making (see chart for details).     MDM:  Pt advised to follow up with Gyn for evalutaion  Final Clinical Impressions(s) / UC Diagnoses   Final diagnoses:  Amenorrhea  Lower abdominal pain     Discharge Instructions      Schedule to see Gynecology for evaluation    ED Prescriptions   None    PDMP not reviewed this encounter. An After Visit Summary was printed and given to the patient.    Elson Areas, [place] 02/23/21 1902

## 2021-02-25 ENCOUNTER — Encounter: Payer: Medicaid Other | Admitting: Internal Medicine

## 2021-03-02 ENCOUNTER — Ambulatory Visit (INDEPENDENT_AMBULATORY_CARE_PROVIDER_SITE_OTHER): Payer: Medicaid Other | Admitting: Internal Medicine

## 2021-03-02 ENCOUNTER — Other Ambulatory Visit: Payer: Self-pay

## 2021-03-02 ENCOUNTER — Encounter: Payer: Self-pay | Admitting: Internal Medicine

## 2021-03-02 VITALS — BP 119/66 | HR 64 | Temp 98.6°F | Ht 63.0 in | Wt 216.5 lb

## 2021-03-02 DIAGNOSIS — R109 Unspecified abdominal pain: Secondary | ICD-10-CM

## 2021-03-02 NOTE — Patient Instructions (Signed)
FU for suprapubic pain

## 2021-03-02 NOTE — Progress Notes (Signed)
CC: abdominal pain  HPI:  Phyllis Kidd is a 25 y.o. female with a past medical history stated below and presents today for abdominal pain. The began about 2 months ago. She states the pain is around the suprapubic area and radiates to the sides of her abdomin. She rates the pain 7/10. She states the pain is constant throughout the day.  She reports a sense of  "fullness" in her bladder, and reports low urine output upon urination. Has not tried any OTC medications for relief. Denies urinary frequency, urgency, or burning with urination. She is sexual active, reports condom use.  She reports 1 sexual partner in the last 3 months. Denies vaginal discomfort, odor or discharge. She is currently on her menstrual cycle that began 02/23/21. Currently on no form of contraception. Previously on depo shot for 3 years and discontinued in year 2020 due to side affects.  Denies fever or chills.   Please see problem based assessment and plan for additional details.  Past Medical History:  Diagnosis Date   Anxiety 2016   Herpes simplex virus (HSV) infection    VBAC, delivered     No current outpatient medications on file prior to visit.   Current Facility-Administered Medications on File Prior to Visit  Medication Dose Route Frequency Provider Last Rate Last Admin   0.9 %  sodium chloride infusion  500 mL Intravenous Once Tressia Danas, MD        Family History  Family history unknown: Yes    Social History   Socioeconomic History   Marital status: Single    Spouse name: Not on file   Number of children: 2   Years of education: Not on file   Highest education level: Not on file  Occupational History   Not on file  Tobacco Use   Smoking status: Never   Smokeless tobacco: Never   Tobacco comments:    Marijuana 1 x a daily  Substance and Sexual Activity   Alcohol use: Yes    Comment: social   Drug use: Yes    Types: Marijuana    Comment: 3 times daily   Sexual activity: Not  Currently    Birth control/protection: None  Other Topics Concern   Not on file  Social History Narrative   ** Merged History Encounter **       Social Determinants of Health   Financial Resource Strain: Not on file  Food Insecurity: Not on file  Transportation Needs: Not on file  Physical Activity: Not on file  Stress: Not on file  Social Connections: Not on file  Intimate Partner Violence: Not on file    Review of Systems  Constitutional:  Negative for chills and fever.  Eyes:  Negative for blurred vision and double vision.  Respiratory:  Negative for shortness of breath and wheezing.   Cardiovascular:  Negative for chest pain and palpitations.  Gastrointestinal:  Positive for abdominal pain. Negative for constipation, diarrhea, heartburn, nausea and vomiting.  Genitourinary:  Negative for dysuria, frequency and urgency.  Neurological:  Negative for dizziness and headaches.    Vitals:   03/02/21 0935  BP: 119/66  Pulse: 64  Temp: 98.6 F (37 C)  TempSrc: Oral  SpO2: 100%  Weight: 216 lb 8 oz (98.2 kg)  Height: 5\' 3"  (1.6 m)     Physical Exam Constitutional:      Appearance: Normal appearance.  HENT:     Head: Normocephalic and atraumatic.  Cardiovascular:  Rate and Rhythm: Normal rate and regular rhythm.     Heart sounds: Normal heart sounds, S1 normal and S2 normal.  Pulmonary:     Effort: Pulmonary effort is normal.     Breath sounds: Normal breath sounds and air entry.  Abdominal:     General: Bowel sounds are normal.     Palpations: Abdomen is soft.     Comments: Suprapubic tenderness upon palpation.  Skin:    General: Skin is warm and dry.  Neurological:     General: No focal deficit present.     Mental Status: She is alert.  Psychiatric:        Attention and Perception: Attention normal.        Mood and Affect: Mood normal.        Speech: Speech normal.        Behavior: Behavior is cooperative.       Assessment & Plan:   See  Encounters Tab for problem based charting.  Patient seen with Dr. Rivka Spring, M.D. [facility], PGY-1 Pager: [phone], Phone: [phone] Date 03/02/2021 Time 10:11 AM

## 2021-03-02 NOTE — Assessment & Plan Note (Signed)
Phyllis Kidd reports suprapubic pain for the last 2 months.  She was last seen in urgent care 02/22/2021 for similar symptoms.  She reports a sense of fullness in her bladder and low urine output upon urination.  She denies urinary frequency, urgency or burning with urination.  She is sexually active with 1 sexual partner in the last 3 months and reports condom use.  She is currently on her menstrual cycle that began 02/23/2021.  Phyllis Kidd was unable to provide a urine sample today.  Bladder scan results: postvoid volume 139.   PLAN: On follow-up visit order urine pregnancy, urine dipstick to rule out infection and pregnancy. Ultrasound imaging of renal system to rule out anatomical causes.

## 2021-03-05 ENCOUNTER — Encounter (HOSPITAL_COMMUNITY): Payer: Self-pay

## 2021-03-05 ENCOUNTER — Emergency Department (HOSPITAL_COMMUNITY)
Admission: EM | Admit: 2021-03-05 | Discharge: 2021-03-05 | Disposition: A | Discharge status: Home / Self Care | Payer: Medicaid Other | Attending: Physician Assistant | Admitting: Physician Assistant

## 2021-03-05 ENCOUNTER — Other Ambulatory Visit: Payer: Self-pay

## 2021-03-05 ENCOUNTER — Ambulatory Visit (HOSPITAL_COMMUNITY): Payer: Medicaid Other

## 2021-03-05 ENCOUNTER — Telehealth: Payer: Self-pay

## 2021-03-05 DIAGNOSIS — N939 Abnormal uterine and vaginal bleeding, unspecified: Secondary | ICD-10-CM | POA: Insufficient documentation

## 2021-03-05 DIAGNOSIS — R102 Pelvic and perineal pain: Secondary | ICD-10-CM | POA: Insufficient documentation

## 2021-03-05 DIAGNOSIS — Z5321 Procedure and treatment not carried out due to patient leaving prior to being seen by health care provider: Secondary | ICD-10-CM | POA: Diagnosis not present

## 2021-03-05 LAB — I-STAT CHEM 8, ED
BUN: 7 mg/dL (ref 6–20)
Calcium, Ion: 1.26 mmol/L (ref 1.15–1.40)
Chloride: 105 mmol/L (ref 98–111)
Creatinine, Ser: 0.8 mg/dL (ref 0.44–1.00)
Glucose, Bld: 102 mg/dL — ABNORMAL HIGH (ref 70–99)
HCT: 37 % (ref 36.0–46.0)
Hemoglobin: 12.6 g/dL (ref 12.0–15.0)
Potassium: 3.8 mmol/L (ref 3.5–5.1)
Sodium: 140 mmol/L (ref 135–145)
TCO2: 24 mmol/L (ref 22–32)

## 2021-03-05 LAB — I-STAT BETA HCG BLOOD, ED (MC, WL, AP ONLY): I-stat hCG, quantitative: <5 m[IU]/mL (ref <5)

## 2021-03-05 NOTE — Telephone Encounter (Signed)
I agree with your assessment and plan, she needs to be evaluated in person.

## 2021-03-05 NOTE — ED Provider Notes (Signed)
Emergency Medicine Provider Triage Evaluation Note  Phyllis Kidd , a 25 y.o. female  was evaluated in triage.  Pt complains of vaginal bleeding and pelvic cramping that started about 1 month ago. Stopped birth control a few years ago but did not have a menstrual cycle until this month  Review of Systems  Positive: Vaginal bleeding, pelvic cramping Negative: fever  Physical Exam  BP (!) 142/69   Pulse 70   Temp 98.1 F (36.7 C) (Oral)   Resp 16   Ht 5\' 3"  (1.6 m)   Wt 97.5 kg   SpO2 100%   BMI 38.09 kg/m  Gen:   Awake, no distress   Resp:  Normal effort  MSK:   Moves extremities without difficulty  Other:    Medical Decision Making  Medically screening exam initiated at 2:33 PM.  Appropriate orders placed.  Leaner Raby was informed that the remainder of the evaluation will be completed by another provider, this initial triage assessment does not replace that evaluation, and the importance of remaining in the ED until their evaluation is complete.     Curley Spice, PA-C 03/05/21 1434    03/07/21, MD 03/06/21 1245

## 2021-03-05 NOTE — Telephone Encounter (Signed)
Requesting to speak with a nurse about meds. Please call pt back.  

## 2021-03-05 NOTE — Telephone Encounter (Signed)
TC received from patient, she is c/o increased vaginal bleeding and states after she left MD appt on 03/02/21 her vaginal bleeding increased where she is having to change her pads Q 5 min.  She states they are saturated, she is wearing regular pads and she does note "tiny/raison size clots".   Patient instructed to present to ED for evaluation, she verbalized understanding. SChaplin, RN,BSN

## 2021-03-05 NOTE — ED Triage Notes (Signed)
Pt reports vaginal bleeding for the past month, started light and is now heavy, changing a pad about every 5 mins. Pt seen at [facility] on 7/18. Pt sent here by PCP for further eval.

## 2021-03-05 NOTE — Telephone Encounter (Signed)
RTN, VM obtained and VM box has not been set up.  RN unable to leave a message. SChaplin, RN,BSN

## 2021-03-05 NOTE — ED Notes (Signed)
LWBS 

## 2021-03-07 NOTE — Progress Notes (Signed)
Internal Medicine Clinic Attending  I saw and evaluated the patient.  I personally confirmed the key portions of the history and exam documented by Dr. Ruben Im and I reviewed pertinent patient test results.  The assessment, diagnosis, and plan were formulated together and I agree with the documentation in the resident's note.  While an underlying cause isn't currently apparently, her symptoms could suggest urinary retention or gynecologic problem.  The duration and nature of her pain are unusual and further investigation is indicated.

## 2021-04-27 ENCOUNTER — Other Ambulatory Visit (HOSPITAL_COMMUNITY)
Admission: RE | Admit: 2021-04-27 | Discharge: 2021-04-27 | Disposition: A | Discharge status: Home / Self Care | Payer: Medicaid Other | Source: Ambulatory Visit | Attending: Student in an Organized Health Care Education/Training Program | Admitting: Student in an Organized Health Care Education/Training Program

## 2021-04-27 ENCOUNTER — Ambulatory Visit (INDEPENDENT_AMBULATORY_CARE_PROVIDER_SITE_OTHER): Payer: Medicaid Other | Admitting: Internal Medicine

## 2021-04-27 VITALS — BP 119/69 | HR 80 | Temp 98.4°F | Wt 213.5 lb

## 2021-04-27 DIAGNOSIS — M79642 Pain in left hand: Secondary | ICD-10-CM | POA: Diagnosis not present

## 2021-04-27 DIAGNOSIS — N911 Secondary amenorrhea: Secondary | ICD-10-CM

## 2021-04-27 DIAGNOSIS — M79641 Pain in right hand: Secondary | ICD-10-CM | POA: Diagnosis not present

## 2021-04-27 DIAGNOSIS — R109 Unspecified abdominal pain: Secondary | ICD-10-CM | POA: Diagnosis not present

## 2021-04-27 DIAGNOSIS — Z0000 Encounter for general adult medical examination without abnormal findings: Secondary | ICD-10-CM | POA: Diagnosis not present

## 2021-04-27 LAB — POCT URINE PREGNANCY: Preg Test, Ur: NEGATIVE

## 2021-04-27 NOTE — Progress Notes (Signed)
   CC: routine visit  HPI:  [name] is a 25 y.o. person with anxiety, GERD, and carpal tunnel who presents to the [facility] for a routine visit. Please see problem-based list for further details, assessments, and plans.   Past Medical History:  Diagnosis Date   Anxiety 2016   Herpes simplex virus (HSV) infection    VBAC, delivered    Review of Systems:  Review of Systems  Constitutional:  Negative for chills and fever.  HENT:  Negative for congestion and sore throat.   Respiratory:  Negative for wheezing.   Cardiovascular:  Negative for chest pain and palpitations.  Gastrointestinal:  Positive for abdominal pain and heartburn. Negative for diarrhea, nausea and vomiting.  Genitourinary:  Negative for dysuria, hematuria and urgency.  Musculoskeletal: Negative.   Neurological:  Negative for dizziness and headaches.    Physical Exam:  Vitals:   04/27/21 1512  BP: 119/69  Pulse: 80  Temp: 98.4 F (36.9 C)  TempSrc: Oral  SpO2: 99%  Weight: 213 lb 8 oz (96.8 kg)   General: No acute distress. CV: RRR. No murmurs, rubs, or gallops. No LE edema Pulmonary: Lungs CTAB. Normal effort. No wheezing or rales. Abdominal: Soft, suprapubic tenderness to palpation. Normal bowel sounds. Obese abdomen Extremities: Palpable radial and DP pulses. Normal ROM. Skin: Warm and dry. No obvious rash or lesions. Neuro: A&Ox3. Moves all extremities. Normal sensation. No focal deficit. Psych: Normal mood and affect   Assessment & Plan:   See Encounters Tab for problem based charting.  Patient seen with Dr. Oswaldo Done

## 2021-04-27 NOTE — Assessment & Plan Note (Signed)
-   Tdap and flu shot declined today

## 2021-04-27 NOTE — Assessment & Plan Note (Signed)
Patient has followed up with ortho regarding her hand pain since her last visit. She states that they injected a steroid into her wrists, and this did not help her pain at all, in fact, she says that it made her pain and numbness/tingling worse. She also notes that she had a nerve conduction study and that it often feels like "she is still hooked up to the machine". The patient notes that nothing seems to help her pain and she is quite frustrated by this. She has tried OTC medicines like tylenol and ibuprofen, and also has minimal relief with wrist braces/splints. Advised the patient to follow up with ortho again to evaluate whether she would be a candidate for any surgical interventions.  Plan: - Follow up with ortho

## 2021-04-27 NOTE — Patient Instructions (Signed)
Thank you, Ms.Ladoris Belcher for allowing [place] to provide your care today. Today we discussed: Abdominal pain: Ordering a transvaginal ultrasound today to further evaluate the cause of your pain. Amenorrhea: Pregnancy test and chlamydia screening done today. Referral also placed to OBGYN  Wrist pain: Follow up with the orthopedic doctors regarding your wrist pain to see what other treatment options are available to you  I have ordered the following labs for you:   Lab Orders         POCT Urine Pregnancy      Tests ordered today:  Transvaginal ultrasound  Referrals ordered today:    Referral Orders         Ambulatory referral to Obstetrics / Gynecology      I have ordered the following medication/changed the following medications:   Stop the following medications: There are no discontinued medications.   Start the following medications: No orders of the defined types were placed in this encounter.    Follow up: 6 months     Should you have any questions or concerns please call the internal medicine clinic at [phone].     Elza Rafter, D.O. [facility]

## 2021-04-27 NOTE — Assessment & Plan Note (Addendum)
Patient states that prior to starting birth control, she used to have normal monthly menstrual cycles. She has been off of birth control for 3 years and states that she has only had 1 menstrual cycle, which was in July. She denies any history of STDs and denies any vaginal discharge or urinary symptoms at this time. Patient is unsure if she could be pregnant. Patient was previously referred to OBGYN to further evaluate her amenorrhea, however, she did not follow up with them. Most recent pap smear in 2020 was normal and patient is not due for another one until 2023.  Plan: - Referral placed again to OBGYN - POC pregnancy test and chlamydia screening ordered today - Transvaginal ultrasound ordered (see abdominal pain assessment)  ADDENDUM: Simple cyst RIGHT ovary 3.8 cm greatest diameter; no follow-up imaging recommended.  Complex cystic lesion LEFT ovary 3.7 cm greatest diameter, containing internal nodule versus retracted clot; short-term follow-up ultrasound recommended in 6-12 weeks to reassess, with further recommendations based on size and appearance at follow-up Imaging  - Placed order for US pelvic complete with transvaginal to be repeated in 6 weeks; called patient and made aware of results and recommended she call OBGYN to make an appt to be seen. Provided patient with phone number for the OBGYN clinic.

## 2021-04-27 NOTE — Assessment & Plan Note (Signed)
Patient reports that she is still having abdominal pain, localized to her suprapubic region. She states that it feels like a sharp pain that comes and goes. She does not know what triggers the pain and nothing seems to make it better or worse. She denies any dysuria, urinary urgency or frequency. She also denies any new sexual partners and is not concerned for an STD at this time. She also denies any vaginal discharge. Patient also notes she has only had 1 menstrual cycle in the last 3 years since being off of birth control. Given her irregular cycles, concern for PCOS, however, she denies any hirsutism. Her pain could still be secondary to ovarian cysts, and the differential would also include uterine fibroids, endometriosis.   Plan: - Transvaginal ultrasound order placed

## 2021-04-28 LAB — URINE CYTOLOGY ANCILLARY ONLY
Chlamydia: NEGATIVE
Comment: NEGATIVE
Comment: NORMAL
Neisseria Gonorrhea: NEGATIVE

## 2021-04-28 NOTE — Progress Notes (Signed)
Internal Medicine Clinic Attending ° °I saw and evaluated the patient.  I personally confirmed the key portions of the history and exam documented by Dr. Atway and I reviewed pertinent patient test results.  The assessment, diagnosis, and plan were formulated together and I agree with the documentation in the resident’s note.  °

## 2021-05-13 ENCOUNTER — Other Ambulatory Visit: Payer: Self-pay

## 2021-05-13 ENCOUNTER — Other Ambulatory Visit: Payer: Self-pay | Admitting: Internal Medicine

## 2021-05-13 ENCOUNTER — Encounter (HOSPITAL_COMMUNITY): Payer: Self-pay

## 2021-05-13 ENCOUNTER — Ambulatory Visit (HOSPITAL_COMMUNITY)
Admission: RE | Admit: 2021-05-13 | Discharge: 2021-05-13 | Disposition: A | Discharge status: Home / Self Care | Payer: Medicaid Other | Source: Ambulatory Visit | Attending: Internal Medicine | Admitting: Internal Medicine

## 2021-05-13 ENCOUNTER — Ambulatory Visit (HOSPITAL_COMMUNITY)
Admission: RE | Admit: 2021-05-13 | Discharge: 2021-05-13 | Disposition: A | Discharge status: Home / Self Care | Payer: Medicaid Other | Source: Ambulatory Visit | Attending: Student in an Organized Health Care Education/Training Program | Admitting: Student in an Organized Health Care Education/Training Program

## 2021-05-13 DIAGNOSIS — R109 Unspecified abdominal pain: Secondary | ICD-10-CM

## 2021-05-13 DIAGNOSIS — N911 Secondary amenorrhea: Secondary | ICD-10-CM

## 2021-05-13 DIAGNOSIS — N83291 Other ovarian cyst, right side: Secondary | ICD-10-CM | POA: Diagnosis not present

## 2021-05-14 NOTE — Addendum Note (Signed)
Addended by: Elza Rafter on: 05/14/2021 02:30 PM   Modules accepted: Orders

## 2021-06-15 ENCOUNTER — Telehealth: Payer: Self-pay | Admitting: Internal Medicine

## 2021-06-15 DIAGNOSIS — M79642 Pain in left hand: Secondary | ICD-10-CM

## 2021-06-15 DIAGNOSIS — M79641 Pain in right hand: Secondary | ICD-10-CM

## 2021-06-15 NOTE — Telephone Encounter (Signed)
Pt requesting to use a new Ortho office instead of Dr. Merlyn Lot [facility]) for her Carpal tunnel.  Please advise if a new referral can be placed as the patient states she mentioned a new Referral at her LOV on  04/27/2021.

## 2021-06-16 NOTE — Telephone Encounter (Signed)
New referral placed.

## 2021-06-16 NOTE — Telephone Encounter (Signed)
Great!! Please advise if you will place the New Ortho Order.

## 2021-06-21 ENCOUNTER — Other Ambulatory Visit: Payer: Self-pay

## 2021-06-21 ENCOUNTER — Ambulatory Visit (INDEPENDENT_AMBULATORY_CARE_PROVIDER_SITE_OTHER): Payer: Medicaid Other | Admitting: Obstetrics and Gynecology

## 2021-06-21 ENCOUNTER — Encounter: Payer: Self-pay | Admitting: Obstetrics and Gynecology

## 2021-06-21 VITALS — Ht 63.0 in | Wt 209.1 lb

## 2021-06-21 DIAGNOSIS — R1030 Lower abdominal pain, unspecified: Secondary | ICD-10-CM

## 2021-06-21 DIAGNOSIS — N926 Irregular menstruation, unspecified: Secondary | ICD-10-CM | POA: Insufficient documentation

## 2021-06-21 DIAGNOSIS — N911 Secondary amenorrhea: Secondary | ICD-10-CM

## 2021-06-21 DIAGNOSIS — Z131 Encounter for screening for diabetes mellitus: Secondary | ICD-10-CM | POA: Diagnosis not present

## 2021-06-21 MED ORDER — NORETHIN ACE-ETH ESTRAD-FE 1-20 MG-MCG(24) PO TABS
1.0000 | ORAL_TABLET | Freq: Every day | ORAL | 11 refills | Status: DC
Start: 2021-06-21 — End: 2021-07-26

## 2021-06-21 MED ORDER — MEDROXYPROGESTERONE ACETATE 10 MG PO TABS: 10.0000 mg | ORAL_TABLET | Freq: Every day | ORAL | 2 refills | Status: DC

## 2021-06-21 NOTE — Progress Notes (Signed)
Annual Irregular menses, Reports heavy bleeding with menses. Recent history of abdominal pain, PCP ordered US, ovarian cyst left and right Denies vaginal concerns. No method of contraception currently, open to discuss.

## 2021-06-21 NOTE — Progress Notes (Signed)
CC: abdominal pain, irregular cycle Subjective:    Patient ID: Phyllis Kidd, female    DOB: 1995/08/29, 25 y.o.   MRN: [id number]  Gynecologic Exam Associated symptoms include abdominal pain.  25 yo G2P2 seen for discussion of ovarian cysts, irregular menses and abdominal pain.    Pt notes 2-3 year hx of intermittent, sharp lower abdominal pain.  The pain is located in the lower abdomen near her c section scar centrally, but can also be felt on both sides of the abdomen.  Pt denies nausea/vomiting and dysuria.  She denies constipation and diarrhea.  Pt denies dyspareunia.  She has never had a GI work up as well.  The patient also notes her last depo provera shot was 3 years ago, but her menses just returned in 2022.  Menses can skip 1-3 months and the following menses is usually heavy lasting 7 days.   Review of Systems  Gastrointestinal:  Positive for abdominal pain.  Genitourinary:  Positive for menstrual problem.      Objective:   Physical Exam Constitutional:      Appearance: Normal appearance. She is obese.  HENT:     Head: Normocephalic and atraumatic.  Cardiovascular:     Rate and Rhythm: Normal rate and regular rhythm.     Heart sounds: Normal heart sounds.  Pulmonary:     Effort: Pulmonary effort is normal.     Breath sounds: Normal breath sounds.  Abdominal:     General: Abdomen is flat. There is no distension.     Palpations: Abdomen is soft.     Tenderness: There is no abdominal tenderness. There is no guarding.  Genitourinary:    Comments: SSE: vagina WNL          Cervix visually WNL.  , no abnl discharge SVE: uterus normal in size, no uterine tenderness, no CMT, no adnexal masses or pain Neurological:     Mental Status: She is alert.   There were no vitals filed for this visit.   CLINICAL DATA:  CP pubic pain, patient states she can feeling not in her RIGHT lower quadrant, LMP 05/03/2021, history of Caesarean section   EXAM: TRANSABDOMINAL AND  TRANSVAGINAL ULTRASOUND OF PELVIS   TECHNIQUE: Both transabdominal and transvaginal ultrasound examinations of the pelvis were performed. Transabdominal technique was performed for global imaging of the pelvis including uterus, ovaries, adnexal regions, and pelvic cul-de-sac. It was necessary to proceed with endovaginal exam following the transabdominal exam to visualize the endometrium and LEFT ovary, and to characterize a RIGHT ovarian lesion.   COMPARISON:  None   FINDINGS: Uterus   Measurements: 7.7 x 3.5 x 4.8 cm = volume: 67 mL. Mildly retroverted and anteflexed. Heterogeneous echogenicity. Normal morphology without mass   Endometrium   Thickness: 7 mm.  No endometrial fluid or focal abnormality   Right ovary   Measurements: 4.1 x 2.9 x 4.0 cm = volume: 24.4 mL. Simple cyst RIGHT ovary 3.8 x 3.1 x 2.2 cm; No follow up imaging recommended. Note: This recommendation does not apply to premenarchal patients or to those with increased risk (genetic, family history, elevated tumor markers or other high-risk factors) of ovarian cancer. Reference: Radiology 2019 Nov; 293(2):359-371. no additional masses.   Left ovary   Measurements: 4.4 x 3.3 x 4.3 cm = volume: 31.8 mL. Complex cystic lesion of LEFT ovary 3.7 x 3.7 x 2.6 cm, containing a complex cystic fluid as well as a in internal mural nodule versus retracted clot measuring 3.4 x 2.5  x 3.1 cm. No internal blood flow seen within this nodular focus on color Doppler imaging.   Other findings   No free pelvic fluid or other adnexal masses.   IMPRESSION: Simple cyst RIGHT ovary 3.8 cm greatest diameter; no follow-up imaging recommended.   Complex cystic lesion LEFT ovary 3.7 cm greatest diameter, containing internal nodule versus retracted clot; short-term follow-up ultrasound recommended in 6-12 weeks to reassess, with further recommendations based on size and appearance at follow-up imaging.   Unremarkable uterus  and endometrial complex.          Assessment & Plan:   1. Secondary amenorrhea History is suspicious for PCOS, will check labs. Pt advised to decrease weight by 10% to increase chances of spontaneous menses and more regular menses.  Will check labs. Provera challenge if no menses by mid December.  Start OCP with next menses for regulation.  OCP also preventative for new ovarian cysts.  - Beta hCG quant (ref lab) - FSH - Insulin, random - HgB A1c - medroxyPROGESTERone (PROVERA) 10 MG tablet; Take 1 tablet (10 mg total) by mouth daily. Use for ten days  Dispense: 10 tablet; Refill: 2 - Norethindrone Acetate-Ethinyl Estrad-FE (LOESTRIN 24 FE) 1-20 MG-MCG(24) tablet; Take 1 tablet by mouth daily.  Dispense: 28 tablet; Refill: 11  2. Lower abdominal pain History and exam does not support gyn etiology for pain.  Small possibility of adhesive disease.  If pain persists, consider GI referral or diagnostic laparoscopy.  3. Irregular menses As above, trial of OCP x 3-4 months to control menses, weight loss as above.  F/u in 4 months in person to reevaluate.    Warden Fillers, MD Faculty Attending, [facility]

## 2021-06-21 NOTE — Patient Instructions (Signed)
If no period by December 10th, check pregnancy test, if not pregnant then start provera 1 tablet by mouth daily.  Hopefully you will start a period 3-4 days after completing provera.  Start birth control pills the first day of a period.  No missed pills.

## 2021-06-22 LAB — HEMOGLOBIN A1C
Est. average glucose Bld gHb Est-mCnc: 114 mg/dL
Hgb A1c MFr Bld: 5.6 % (ref 4.8–5.6)

## 2021-06-22 LAB — BETA HCG QUANT (REF LAB): hCG Quant: <1 m[IU]/mL

## 2021-06-22 LAB — FOLLICLE STIMULATING HORMONE: FSH: 4.5 m[IU]/mL

## 2021-06-22 LAB — INSULIN, RANDOM: INSULIN: 12.8 u[IU]/mL (ref 2.6–24.9)

## 2021-07-14 DIAGNOSIS — G5603 Carpal tunnel syndrome, bilateral upper limbs: Secondary | ICD-10-CM | POA: Diagnosis not present

## 2021-07-26 ENCOUNTER — Other Ambulatory Visit: Payer: Self-pay | Admitting: *Deleted

## 2021-07-26 DIAGNOSIS — N911 Secondary amenorrhea: Secondary | ICD-10-CM

## 2021-07-26 MED ORDER — NORETHIN ACE-ETH ESTRAD-FE 1-20 MG-MCG(24) PO TABS: 1.0000 | ORAL_TABLET | Freq: Every day | ORAL | 11 refills | Status: DC

## 2021-07-26 NOTE — Progress Notes (Signed)
Pt called to office re: [facility] Rx.  Pt states she did not get Rx at visit. Pt made aware that Rx was sent in at time of visit- pt did not get.  Rx was reordered today.  Pt states she is on cycle today and advised to start pack today as to stay on track.   Pt states understanding.

## 2021-08-17 ENCOUNTER — Ambulatory Visit (HOSPITAL_COMMUNITY): Payer: Medicaid Other | Attending: Student in an Organized Health Care Education/Training Program

## 2021-10-01 ENCOUNTER — Emergency Department (HOSPITAL_COMMUNITY): Payer: No Typology Code available for payment source

## 2021-10-01 ENCOUNTER — Emergency Department (HOSPITAL_COMMUNITY)
Admission: EM | Admit: 2021-10-01 | Discharge: 2021-10-01 | Disposition: A | Discharge status: Home / Self Care | Payer: No Typology Code available for payment source | Attending: Emergency Medicine | Admitting: Emergency Medicine

## 2021-10-01 ENCOUNTER — Other Ambulatory Visit: Payer: Self-pay

## 2021-10-01 DIAGNOSIS — S3992XA Unspecified injury of lower back, initial encounter: Secondary | ICD-10-CM | POA: Diagnosis not present

## 2021-10-01 DIAGNOSIS — N9489 Other specified conditions associated with female genital organs and menstrual cycle: Secondary | ICD-10-CM | POA: Diagnosis not present

## 2021-10-01 DIAGNOSIS — M25511 Pain in right shoulder: Secondary | ICD-10-CM | POA: Diagnosis not present

## 2021-10-01 DIAGNOSIS — M546 Pain in thoracic spine: Secondary | ICD-10-CM | POA: Diagnosis not present

## 2021-10-01 DIAGNOSIS — M545 Low back pain, unspecified: Secondary | ICD-10-CM | POA: Diagnosis not present

## 2021-10-01 DIAGNOSIS — M542 Cervicalgia: Secondary | ICD-10-CM | POA: Diagnosis not present

## 2021-10-01 DIAGNOSIS — R200 Anesthesia of skin: Secondary | ICD-10-CM | POA: Insufficient documentation

## 2021-10-01 DIAGNOSIS — M419 Scoliosis, unspecified: Secondary | ICD-10-CM | POA: Diagnosis not present

## 2021-10-01 DIAGNOSIS — R202 Paresthesia of skin: Secondary | ICD-10-CM | POA: Diagnosis not present

## 2021-10-01 DIAGNOSIS — M549 Dorsalgia, unspecified: Secondary | ICD-10-CM | POA: Diagnosis not present

## 2021-10-01 DIAGNOSIS — R079 Chest pain, unspecified: Secondary | ICD-10-CM | POA: Diagnosis not present

## 2021-10-01 DIAGNOSIS — S199XXA Unspecified injury of neck, initial encounter: Secondary | ICD-10-CM | POA: Diagnosis not present

## 2021-10-01 DIAGNOSIS — Y92410 Unspecified street and highway as the place of occurrence of the external cause: Secondary | ICD-10-CM | POA: Insufficient documentation

## 2021-10-01 LAB — I-STAT BETA HCG BLOOD, ED (MC, WL, AP ONLY): I-stat hCG, quantitative: <5 m[IU]/mL (ref <5)

## 2021-10-01 MED ORDER — KETOROLAC TROMETHAMINE 60 MG/2ML IM SOLN
60.0000 mg | Freq: Once | INTRAMUSCULAR | Status: DC
Start: 2021-10-01 — End: 2021-10-02
  Filled 2021-10-01: qty 2

## 2021-10-01 MED ORDER — ACETAMINOPHEN 500 MG PO TABS
1000.0000 mg | ORAL_TABLET | Freq: Once | ORAL | Status: AC
  Administered 2021-10-01: 1000 mg via ORAL
  Filled 2021-10-01: qty 2

## 2021-10-01 MED ORDER — DIAZEPAM 5 MG PO TABS
5.0000 mg | ORAL_TABLET | Freq: Once | ORAL | Status: AC
  Administered 2021-10-01: 5 mg via ORAL
  Filled 2021-10-01: qty 1

## 2021-10-01 MED ORDER — OXYCODONE-ACETAMINOPHEN 5-325 MG PO TABS: 1.0000 | ORAL_TABLET | Freq: Four times a day (QID) | ORAL | 0 refills | Status: DC | PRN

## 2021-10-01 MED ORDER — OXYCODONE-ACETAMINOPHEN 5-325 MG PO TABS
2.0000 | ORAL_TABLET | Freq: Once | ORAL | Status: AC
  Administered 2021-10-01: 2 via ORAL
  Filled 2021-10-01: qty 2

## 2021-10-01 MED ORDER — METHOCARBAMOL 750 MG PO TABS: 750.0000 mg | ORAL_TABLET | Freq: Four times a day (QID) | ORAL | 0 refills | Status: DC

## 2021-10-01 NOTE — ED Notes (Signed)
Patient transported to CT 

## 2021-10-01 NOTE — ED Notes (Signed)
Pt in MRI.
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
## 2021-10-01 NOTE — ED Notes (Signed)
Pt states has intermittent numbness and tingling in legs. Pt able to move all extremities.
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
## 2021-10-01 NOTE — ED Notes (Signed)
Pt requesting soft collar - okay for pt to receive one per MD Freida Busman

## 2021-10-02 NOTE — ED Notes (Signed)
Patient verbalizes understanding of discharge instructions. Opportunity for questioning and answers were provided. Armband removed by staff, pt discharged from ED via wheelchair.  

## 2021-10-04 ENCOUNTER — Telehealth: Payer: Self-pay

## 2021-10-04 NOTE — Telephone Encounter (Signed)
Transition Care Management Follow-up Telephone Call Date of discharge and from where: 10/01/2021 from [facility] How have you been since you were released from the hospital? Patient stated that she is still in a lot of pain. Patient stated that she is experiencing some vision changes and she describes it as blurriness. Patient denies cardiac symptoms.  Any questions or concerns? No  Items Reviewed: Did the pt receive and understand the discharge instructions provided? Yes  Medications obtained and verified? Yes  Other? No  Any new allergies since your discharge? No  Dietary orders reviewed? No Do you have support at home? Yes   Functional Questionnaire: (I = Independent and D = Dependent) ADLs: I  Bathing/Dressing- I  Meal Prep- I  Eating- I  Maintaining continence- I  Transferring/Ambulation- I  Managing Meds- I  Follow up appointments reviewed:  PCP Hospital f/u appt confirmed? No  Patient encouraged to reach out to PCP for follow up.  Specialist Hospital f/u appt confirmed? No   Are transportation arrangements needed? No  If their condition worsens, is the pt aware to call PCP or go to the Emergency Dept.? Yes Was the patient provided with contact information for the PCP's office or ED? Yes Was to pt encouraged to call back with questions or concerns? Yes

## 2021-10-08 ENCOUNTER — Other Ambulatory Visit: Payer: Self-pay | Admitting: Internal Medicine

## 2021-10-08 ENCOUNTER — Telehealth: Payer: Self-pay | Admitting: *Deleted

## 2021-10-08 MED ORDER — METHOCARBAMOL 750 MG PO TABS
750.0000 mg | ORAL_TABLET | Freq: Four times a day (QID) | ORAL | 0 refills | Status: DC
Start: 2021-10-08 — End: 2021-10-11

## 2021-10-08 MED ORDER — OXYCODONE-ACETAMINOPHEN 5-325 MG PO TABS: 1.0000 | ORAL_TABLET | Freq: Four times a day (QID) | ORAL | 0 refills | Status: DC | PRN

## 2021-10-08 NOTE — Telephone Encounter (Signed)
Patient notified refills have been sent. She is very [employer] attorney. ?
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
## 2021-10-11 ENCOUNTER — Ambulatory Visit (INDEPENDENT_AMBULATORY_CARE_PROVIDER_SITE_OTHER): Payer: Medicaid Other | Admitting: Student

## 2021-10-11 ENCOUNTER — Other Ambulatory Visit: Payer: Self-pay

## 2021-10-11 DIAGNOSIS — S161XXA Strain of muscle, fascia and tendon at neck level, initial encounter: Secondary | ICD-10-CM | POA: Diagnosis not present

## 2021-10-11 DIAGNOSIS — F430 Acute stress reaction: Secondary | ICD-10-CM | POA: Diagnosis present

## 2021-10-11 MED ORDER — METHOCARBAMOL 750 MG PO TABS: 750.0000 mg | ORAL_TABLET | Freq: Four times a day (QID) | ORAL | 0 refills | Status: DC
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
## 2021-10-11 NOTE — Progress Notes (Signed)
Internal Medicine Clinic Attending  Case discussed with Dr. Katsadouros  At the time of the visit.  We reviewed the resident's history and exam and pertinent patient test results.  I agree with the assessment, diagnosis, and plan of care documented in the resident's note.  
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
## 2021-10-19 ENCOUNTER — Encounter: Payer: Self-pay | Admitting: Internal Medicine

## 2021-10-21 ENCOUNTER — Ambulatory Visit: Payer: No Typology Code available for payment source | Admitting: Physical Therapy

## 2021-11-04 ENCOUNTER — Ambulatory Visit: Payer: Medicaid Other | Admitting: Behavioral Health

## 2021-11-04 DIAGNOSIS — F419 Anxiety disorder, unspecified: Secondary | ICD-10-CM

## 2021-11-04 DIAGNOSIS — F331 Major depressive disorder, recurrent, moderate: Secondary | ICD-10-CM

## 2021-11-04 DIAGNOSIS — F430 Acute stress reaction: Secondary | ICD-10-CM
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
## 2021-11-05 ENCOUNTER — Other Ambulatory Visit: Payer: Self-pay

## 2021-11-05 ENCOUNTER — Ambulatory Visit (INDEPENDENT_AMBULATORY_CARE_PROVIDER_SITE_OTHER): Payer: Medicaid Other | Admitting: Internal Medicine

## 2021-11-05 DIAGNOSIS — M792 Neuralgia and neuritis, unspecified: Secondary | ICD-10-CM | POA: Diagnosis present

## 2021-11-05 MED ORDER — DULOXETINE HCL 30 MG PO CPEP: 30.0000 mg | ORAL_CAPSULE | Freq: Every day | ORAL | 2 refills | Status: DC

## 2021-11-05 MED ORDER — NAPROXEN 500 MG PO TABS: 500.0000 mg | ORAL_TABLET | Freq: Two times a day (BID) | ORAL | 0 refills | Status: DC
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
## 2021-11-05 NOTE — Progress Notes (Signed)
? ?  CC: acute visit for neck pain  ? ?HPI: ? ?[name] is a 26 y.o. female with a PMHx stated below and presents today for stated above. Please see the Encounters tab for problem-based Assessment & Plan for additional details.  ? ?Past Medical History:  ?Diagnosis Date  ? Anxiety 2016  ? Herpes simplex virus (HSV) infection   ? VBAC, delivered   ? ? ?Current Outpatient Medications on File Prior to Visit  ?Medication Sig Dispense Refill  ? medroxyPROGESTERone (PROVERA) 10 MG tablet Take 1 tablet (10 mg total) by mouth daily. Use for ten days 10 tablet 2  ? methocarbamol (ROBAXIN-750) 750 MG tablet Take 1 tablet (750 mg total) by mouth 4 (four) times daily. 20 tablet 0  ? Norethindrone Acetate-Ethinyl Estrad-FE (LOESTRIN 24 FE) 1-20 MG-MCG(24) tablet Take 1 tablet by mouth daily. 28 tablet 11  ? ?Current Facility-Administered Medications on File Prior to Visit  ?Medication Dose Route Frequency Provider Last Rate Last Admin  ? 0.9 %  sodium chloride infusion  500 mL Intravenous Once [place], MD      ? ? ?Family History  ?Family history unknown: Yes  ? ? ?Social History  ? ?Socioeconomic History  ? Marital status: Single  ?  Spouse name: Not on file  ? Number of children: 2  ? Years of education: Not on file  ? Highest education level: Not on file  ?Occupational History  ? Not on file  ?Tobacco Use  ? Smoking status: Never  ? Smokeless tobacco: Never  ? Tobacco comments:  ?  Marijuana 1 x a daily  ?Substance and Sexual Activity  ? Alcohol use: Yes  ?  Comment: social  ? Drug use: Yes  ?  Types: Marijuana  ?  Comment: 3 times daily  ? Sexual activity: Yes  ?  Birth control/protection: None  ?Other Topics Concern  ? Not on file  ?Social History Narrative  ? ** Merged History Encounter **  ?    ? ?Social Determinants of Health  ? ?Financial Resource Strain: Not on file  ?Food Insecurity: Not on file  ?Transportation Needs: Not on file  ?Physical Activity: Not on file  ?Stress: Not on file  ?Social  Connections: Not on file  ?Intimate Partner Violence: Not on file  ? ? ?Review of Systems: ?ROS negative except for what is noted on the assessment and plan. ? ?There were no vitals filed for this visit. ? ? ?Physical Exam: ?Constitutional: alert, well-appearing, in NAD ?HENT: normocephalic, atraumatic, mucous membranes moist ?Eyes: conjunctiva non-erythematous, EOMI ?Neck: supple. C-collar in place. Tender to palpation over spinous process of cervical vertebrae.  No obvious step-off or deformities. ?Cardiovascular: RRR, no m/r/g, non-edematous bilateral LE ?Pulmonary/Chest: normal work of breathing on RA, LCTAB ?Abdominal: soft, non-tender to palpation, non-distended ?MSK: normal bulk and tone.  No obvious step-off or deformities down thoracic or lumbar spine.  Tender to palpate over right anterior chest wall. ?Neurological: alert & oriented x 3, 5/5 strength in bilateral upper extremities.  Normal sensation throughout. ?Skin: warm and dry ?Psych: mild distress ? ?Assessment & Plan:  ? ?See Encounters Tab for problem based charting. ? ?Patient discussed with Dr. Dareen Piano ? ?Lajean Manes, MD  ?Internal Medicine Resident, PGY-1 ?[facility]  ? ?

## 2021-11-05 NOTE — Patient Instructions (Signed)
Please start taking cymbalta daily at bedtime  ?Please start seeing physical theray ?

## 2021-11-08 NOTE — Progress Notes (Signed)
Internal Medicine Clinic Attending  Case discussed with Dr. Patel  At the time of the visit.  We reviewed the resident's history and exam and pertinent patient test results.  I agree with the assessment, diagnosis, and plan of care documented in the resident's note.  

## 2021-11-30 ENCOUNTER — Ambulatory Visit: Payer: Medicaid Other | Admitting: Behavioral Health

## 2021-11-30 ENCOUNTER — Telehealth: Payer: Self-pay | Admitting: Behavioral Health

## 2021-11-30 NOTE — Telephone Encounter (Signed)
Unable to lv Pt a msg re: today's [facility] telehealth appt. Pt can r/s @ her convenience. ? ?Dr. Monna Fam ?

## 2022-01-29 IMAGING — DX DG WRIST COMPLETE 3+V*L*
4 series · 4 of 4 positions shown · non-contrast
Comparison: None.

CLINICAL DATA: Recent hyperextension of the left wrist with pain
and swelling, initial encounter

EXAM:
LEFT WRIST - COMPLETE 3+ VIEW

[wrist pa]
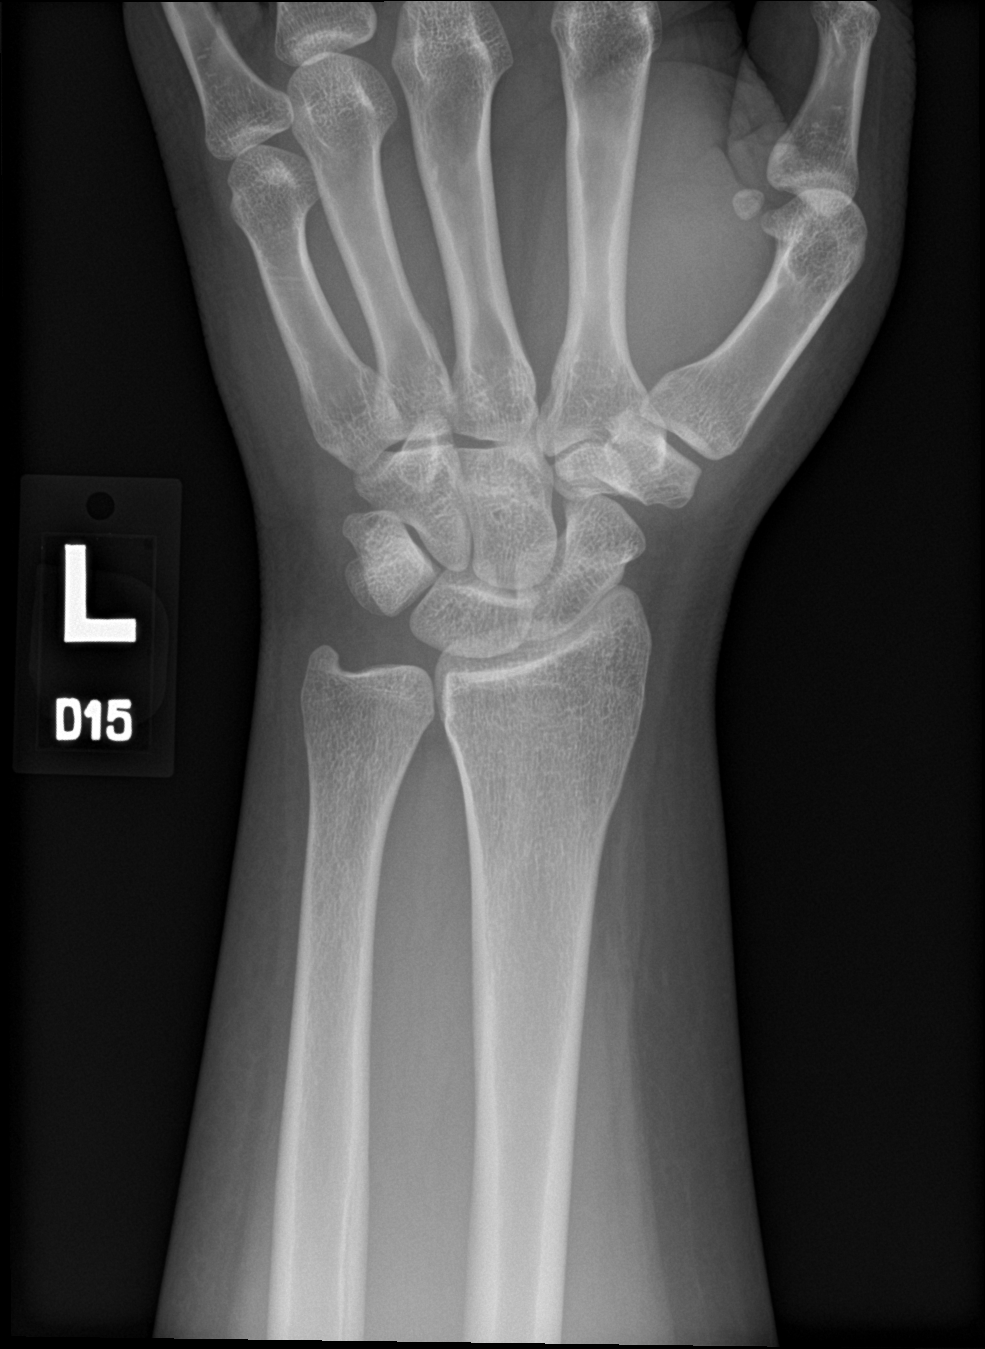

[wrist obl]
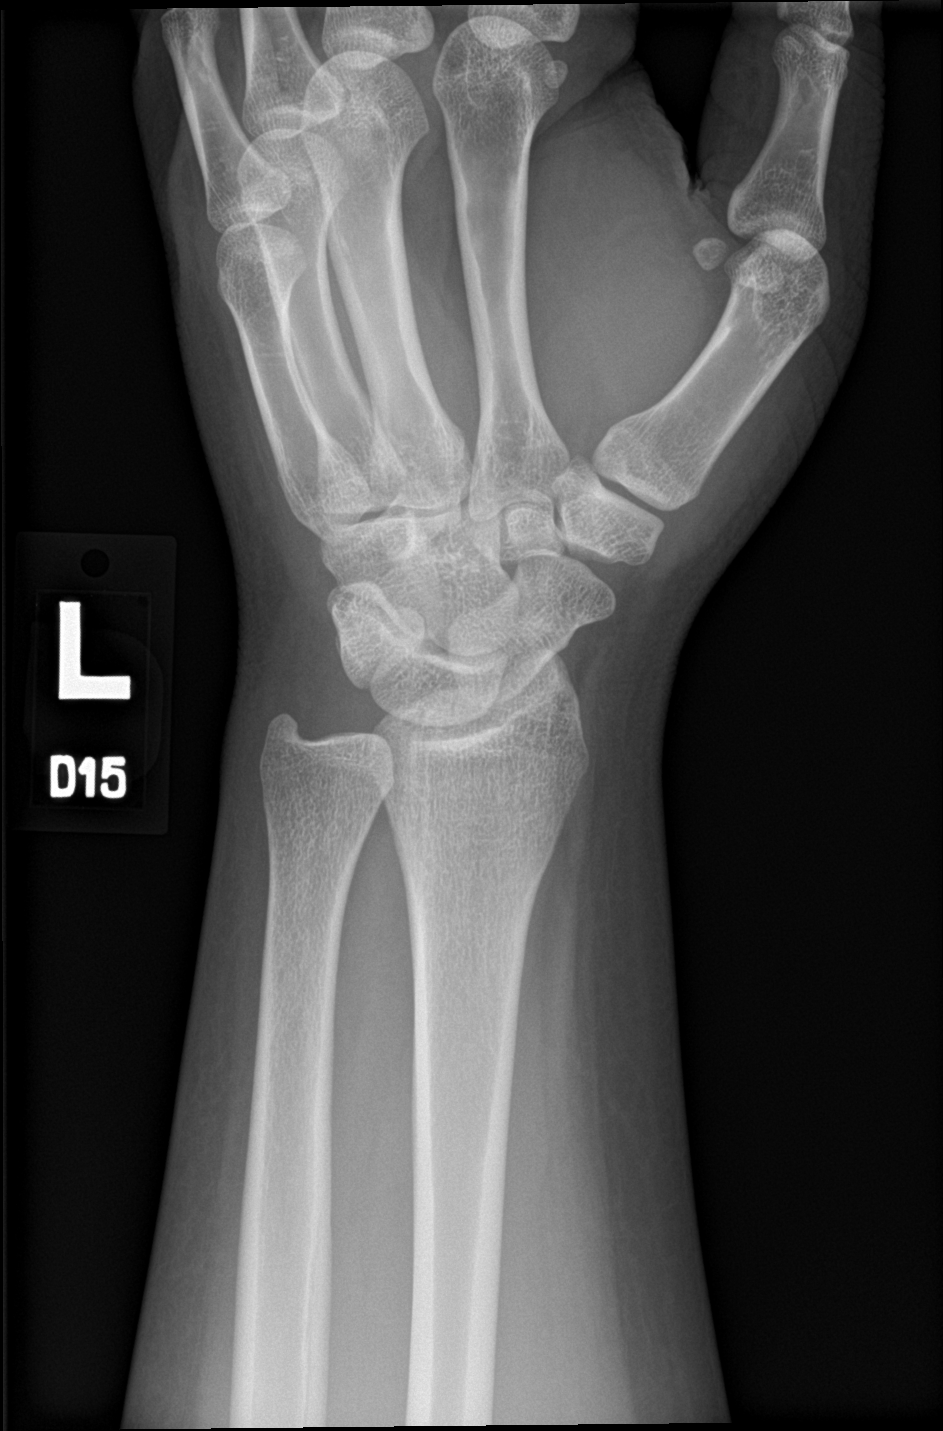

[wrist lat]
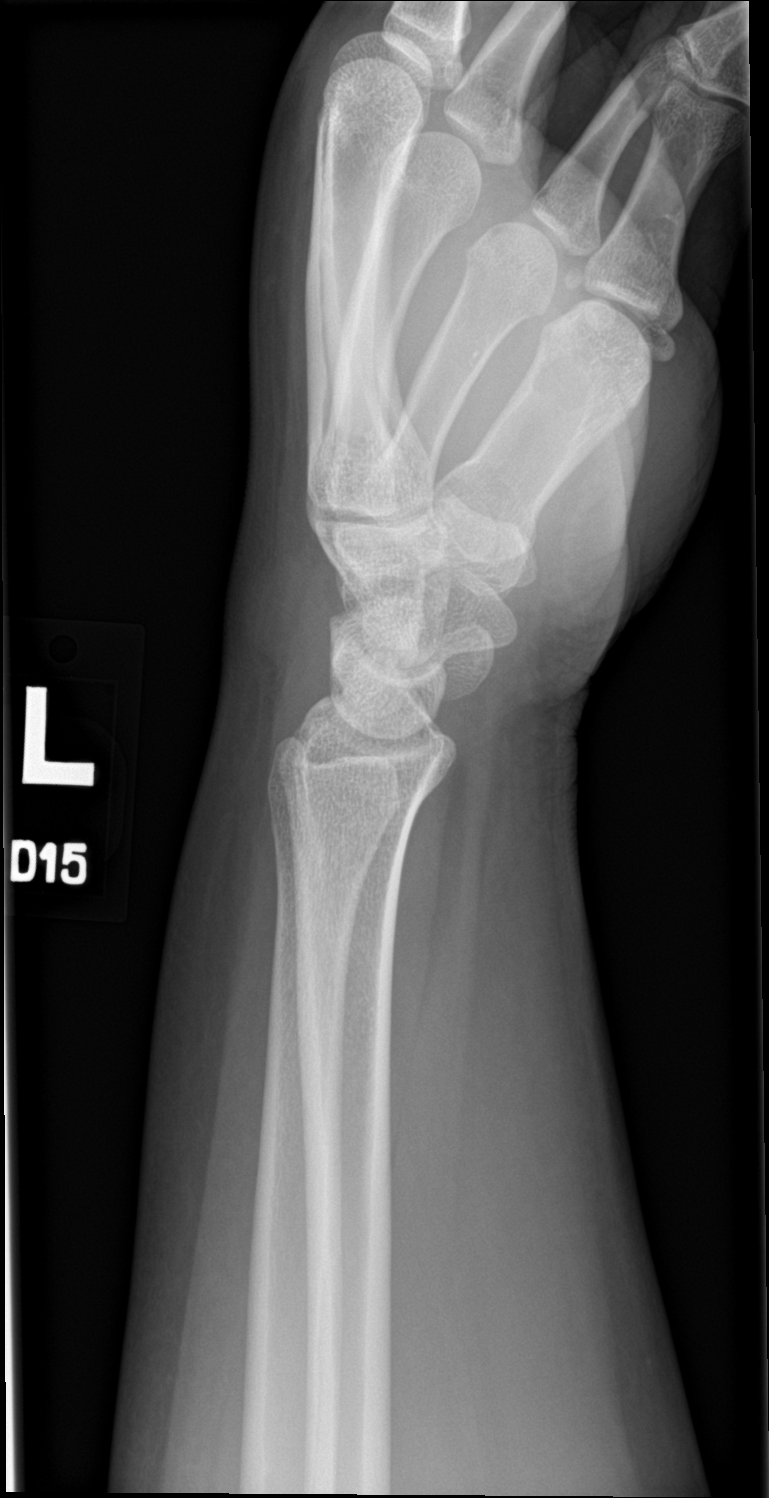

[wrist navicular]
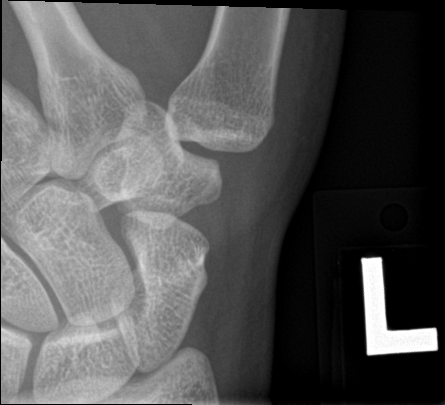

[4 of 4 positions shown; findings below may reference images not displayed]

FINDINGS: There is no evidence of fracture or dislocation. There is no
evidence of arthropathy or other focal bone abnormality. Soft
tissues are unremarkable.
IMPRESSION: No acute abnormality noted.

## 2022-02-25 ENCOUNTER — Encounter (HOSPITAL_COMMUNITY): Payer: Self-pay

## 2022-02-25 ENCOUNTER — Ambulatory Visit (HOSPITAL_COMMUNITY)
Admission: EM | Admit: 2022-02-25 | Discharge: 2022-02-25 | Disposition: A | Discharge status: Home / Self Care | Payer: Medicaid Other | Attending: Emergency Medicine | Admitting: Emergency Medicine

## 2022-02-25 DIAGNOSIS — Z3201 Encounter for pregnancy test, result positive: Secondary | ICD-10-CM

## 2022-02-25 DIAGNOSIS — R519 Headache, unspecified: Secondary | ICD-10-CM

## 2022-02-25 LAB — POCT URINALYSIS DIPSTICK, ED / UC
Bilirubin Urine: NEGATIVE
Glucose, UA: NEGATIVE mg/dL
Hgb urine dipstick: NEGATIVE
Ketones, ur: NEGATIVE mg/dL
Leukocytes,Ua: NEGATIVE
Nitrite: NEGATIVE
Protein, ur: NEGATIVE mg/dL
Specific Gravity, Urine: >=1.03 (ref 1.005–1.030)
Urobilinogen, UA: 0.2 mg/dL (ref 0.0–1.0)
pH: 5.5 (ref 5.0–8.0)

## 2022-02-25 LAB — POC URINE PREG, ED: Preg Test, Ur: POSITIVE — AB

## 2022-02-25 MED ORDER — METHOCARBAMOL 500 MG PO TABS: 500.0000 mg | ORAL_TABLET | Freq: Three times a day (TID) | ORAL | 0 refills | Status: DC | PRN

## 2022-02-25 MED ORDER — DOXYLAMINE-PYRIDOXINE 10-10 MG PO TBEC: DELAYED_RELEASE_TABLET | ORAL | 1 refills | Status: DC

## 2022-02-25 NOTE — ED Triage Notes (Signed)
Pt states frontal headache worse on the  left side for the past week.  Has been taking Tylenol and Motrin with no relief. Also C/O lower abd pain,denies any urinary symptoms.
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
## 2022-02-25 NOTE — Discharge Instructions (Signed)
Today your pregnancy test has come back positive, unfortunately I am unable to determine how far along you may possibly be as you have not had a period in 5 months due to irregularity  Please schedule an appointment with your obstetrician as soon as possible, their information is on your front page to determine gestational age so that you may determine how you would like to move forward  Please avoid use of ibuprofen, naproxen, aspirin, Aleve, Motrin, only use Tylenol 500 to 1000 mg every 6 hours as needed for your discomfort  You may use muscle relaxer every 8 hours for additional comfort, be mindful this may make you drowsy  You may use nausea medicine as directed on packaging  If you decide to move forward with pregnancy, please begin use of prenatal vitamins  Inside of your packet is a list of medications that are safe for you to use that are over-the-counter

## 2022-02-28 ENCOUNTER — Ambulatory Visit (INDEPENDENT_AMBULATORY_CARE_PROVIDER_SITE_OTHER): Payer: Medicaid Other

## 2022-02-28 ENCOUNTER — Ambulatory Visit: Payer: Medicaid Other

## 2022-02-28 VITALS — BP 128/77 | HR 87 | Ht 63.0 in | Wt 201.1 lb

## 2022-02-28 DIAGNOSIS — Z3481 Encounter for supervision of other normal pregnancy, first trimester: Secondary | ICD-10-CM | POA: Diagnosis not present

## 2022-02-28 DIAGNOSIS — Z3A01 Less than 8 weeks gestation of pregnancy: Secondary | ICD-10-CM | POA: Diagnosis not present

## 2022-02-28 DIAGNOSIS — O3680X0 Pregnancy with inconclusive fetal viability, not applicable or unspecified: Secondary | ICD-10-CM

## 2022-02-28 DIAGNOSIS — Z3480 Encounter for supervision of other normal pregnancy, unspecified trimester: Secondary | ICD-10-CM

## 2022-02-28 MED ORDER — GOJJI WEIGHT SCALE MISC: 1.0000 | 0 refills | Status: DC

## 2022-02-28 MED ORDER — PREPLUS 27-1 MG PO TABS: 1.0000 | ORAL_TABLET | Freq: Every day | ORAL | 13 refills | Status: DC

## 2022-02-28 MED ORDER — BLOOD PRESSURE KIT DEVI: 1.0000 | 0 refills | Status: DC

## 2022-02-28 MED ORDER — CITRANATAL BLOOM 90-1 MG PO TABS: 1.0000 | ORAL_TABLET | Freq: Every day | ORAL | 11 refills | Status: DC
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
## 2022-03-14 ENCOUNTER — Ambulatory Visit
Admission: RE | Admit: 2022-03-14 | Discharge: 2022-03-14 | Disposition: A | Discharge status: Home / Self Care | Payer: Medicaid Other | Source: Ambulatory Visit | Attending: Obstetrics and Gynecology | Admitting: Obstetrics and Gynecology

## 2022-03-14 ENCOUNTER — Ambulatory Visit (INDEPENDENT_AMBULATORY_CARE_PROVIDER_SITE_OTHER): Payer: Medicaid Other | Admitting: General Practice

## 2022-03-14 DIAGNOSIS — O3680X0 Pregnancy with inconclusive fetal viability, not applicable or unspecified: Secondary | ICD-10-CM | POA: Insufficient documentation

## 2022-03-14 DIAGNOSIS — Z712 Person consulting for explanation of examination or test findings: Secondary | ICD-10-CM

## 2022-03-14 NOTE — Progress Notes (Signed)
Patient presents to office today for ultrasound results. Reviewed results with Dr Alvester Morin who finds single living IUP at [redacted]w[redacted]d, small [facility] noted- patient can follow up at [facility] for [facility] care.   Reviewed ultrasound results with patient including dating & small [facility] noted. Pictures were also provided to patient. Discussed with patient [facility] that are small like hers are usually not worrisome and could cause some spotting. Advised if she notices heavy bleeding like a period to go to [facility] for evaluation. Recommended she call [facility] office to schedule New OB appt. Patient verbalized understanding.   Chase Caller RN BSN 03/14/22

## 2022-03-15 ENCOUNTER — Encounter: Payer: Self-pay | Admitting: Obstetrics & Gynecology

## 2022-04-25 ENCOUNTER — Other Ambulatory Visit: Payer: Self-pay

## 2022-04-25 DIAGNOSIS — O219 Vomiting of pregnancy, unspecified: Secondary | ICD-10-CM

## 2022-04-25 MED ORDER — DOXYLAMINE-PYRIDOXINE 10-10 MG PO TBEC: DELAYED_RELEASE_TABLET | ORAL | 1 refills | Status: DC

## 2022-04-26 ENCOUNTER — Other Ambulatory Visit: Payer: Self-pay

## 2022-04-26 DIAGNOSIS — O219 Vomiting of pregnancy, unspecified: Secondary | ICD-10-CM

## 2022-04-26 MED ORDER — DOXYLAMINE-PYRIDOXINE 10-10 MG PO TBEC: DELAYED_RELEASE_TABLET | ORAL | 1 refills | Status: DC

## 2022-05-25 ENCOUNTER — Other Ambulatory Visit: Payer: Self-pay | Admitting: Obstetrics and Gynecology

## 2022-05-25 ENCOUNTER — Ambulatory Visit (INDEPENDENT_AMBULATORY_CARE_PROVIDER_SITE_OTHER): Payer: Medicaid Other | Admitting: Obstetrics and Gynecology

## 2022-05-25 ENCOUNTER — Encounter: Payer: Self-pay | Admitting: Obstetrics and Gynecology

## 2022-05-25 ENCOUNTER — Other Ambulatory Visit (HOSPITAL_COMMUNITY)
Admission: RE | Admit: 2022-05-25 | Discharge: 2022-05-25 | Disposition: A | Discharge status: Home / Self Care | Payer: Medicaid Other | Source: Ambulatory Visit | Attending: Obstetrics and Gynecology | Admitting: Obstetrics and Gynecology

## 2022-05-25 VITALS — BP 117/75 | HR 90 | Wt 210.5 lb

## 2022-05-25 DIAGNOSIS — Z98891 History of uterine scar from previous surgery: Secondary | ICD-10-CM | POA: Insufficient documentation

## 2022-05-25 DIAGNOSIS — Z3A17 17 weeks gestation of pregnancy: Secondary | ICD-10-CM | POA: Diagnosis not present

## 2022-05-25 DIAGNOSIS — Z8619 Personal history of other infectious and parasitic diseases: Secondary | ICD-10-CM | POA: Insufficient documentation

## 2022-05-25 DIAGNOSIS — Z3482 Encounter for supervision of other normal pregnancy, second trimester: Secondary | ICD-10-CM

## 2022-05-25 DIAGNOSIS — Z131 Encounter for screening for diabetes mellitus: Secondary | ICD-10-CM | POA: Diagnosis not present

## 2022-05-25 DIAGNOSIS — Z3480 Encounter for supervision of other normal pregnancy, unspecified trimester: Secondary | ICD-10-CM | POA: Diagnosis not present
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
## 2022-05-25 NOTE — Patient Instructions (Signed)
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
## 2022-05-25 NOTE — Addendum Note (Signed)
Addended by: Tristan Schroeder D on: 05/25/2022 04:36 PM   Modules accepted: Orders

## 2022-05-26 LAB — CERVICOVAGINAL ANCILLARY ONLY
Bacterial Vaginitis (gardnerella): NEGATIVE
Candida Glabrata: NEGATIVE
Candida Vaginitis: NEGATIVE
Chlamydia: NEGATIVE
Comment: NEGATIVE
Comment: NEGATIVE
Comment: NEGATIVE
Comment: NEGATIVE
Comment: NEGATIVE
Comment: NORMAL
Neisseria Gonorrhea: NEGATIVE
Trichomonas: NEGATIVE

## 2022-05-26 LAB — CBC/D/PLT+RPR+RH+ABO+RUBIGG...
Antibody Screen: NEGATIVE
Basophils Absolute: 0 10*3/uL (ref 0.0–0.2)
Basos: 0 %
EOS (ABSOLUTE): 0 10*3/uL (ref 0.0–0.4)
Eos: 0 %
HCV Ab: NONREACTIVE
HIV Screen 4th Generation wRfx: NONREACTIVE
Hematocrit: 32.2 % — ABNORMAL LOW (ref 34.0–46.6)
Hemoglobin: 11 g/dL — ABNORMAL LOW (ref 11.1–15.9)
Hepatitis B Surface Ag: NEGATIVE
Immature Grans (Abs): 0 10*3/uL (ref 0.0–0.1)
Immature Granulocytes: 0 %
Lymphocytes Absolute: 1.9 10*3/uL (ref 0.7–3.1)
Lymphs: 27 %
MCH: 31 pg (ref 26.6–33.0)
MCHC: 34.2 g/dL (ref 31.5–35.7)
MCV: 91 fL (ref 79–97)
Monocytes Absolute: 0.4 10*3/uL (ref 0.1–0.9)
Monocytes: 6 %
Neutrophils Absolute: 4.6 10*3/uL (ref 1.4–7.0)
Neutrophils: 67 %
Platelets: 325 10*3/uL (ref 150–450)
RBC: 3.55 x10E6/uL — ABNORMAL LOW (ref 3.77–5.28)
RDW: 12.8 % (ref 11.7–15.4)
RPR Ser Ql: NONREACTIVE
Rh Factor: POSITIVE
Rubella Antibodies, IGG: 1.05 index (ref >0.99)
WBC: 7 10*3/uL (ref 3.4–10.8)

## 2022-05-26 LAB — HEMOGLOBIN A1C
Est. average glucose Bld gHb Est-mCnc: 97 mg/dL
Hgb A1c MFr Bld: 5 % (ref 4.8–5.6)

## 2022-05-26 LAB — HCV INTERPRETATION

## 2022-05-27 LAB — AFP, SERUM, OPEN SPINA BIFIDA
AFP MoM: 1.35
AFP Value: 46.8 ng/mL
Gest. Age on Collection Date: 17 weeks
Maternal Age At EDD: 25.4 yr
OSBR Risk 1 IN: 8303
Test Results:: NEGATIVE
Weight: 210 [lb_av]

## 2022-05-27 LAB — URINE CULTURE, OB REFLEX

## 2022-05-27 LAB — CYTOLOGY - PAP
Comment: NEGATIVE
Diagnosis: UNDETERMINED — AB
High risk HPV: NEGATIVE

## 2022-05-27 LAB — CULTURE, OB URINE

## 2022-05-30 LAB — PANORAMA PRENATAL TEST FULL PANEL:PANORAMA TEST PLUS 5 ADDITIONAL MICRODELETIONS: FETAL FRACTION: 5.5

## 2022-06-05 LAB — HORIZON CUSTOM: REPORT SUMMARY: NEGATIVE
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
## 2022-06-10 ENCOUNTER — Ambulatory Visit: Payer: Medicaid Other | Attending: Obstetrics and Gynecology

## 2022-06-10 ENCOUNTER — Ambulatory Visit: Payer: Medicaid Other | Admitting: *Deleted

## 2022-06-10 ENCOUNTER — Encounter: Payer: Self-pay | Admitting: *Deleted

## 2022-06-10 VITALS — BP 113/57 | HR 76

## 2022-06-10 DIAGNOSIS — O99212 Obesity complicating pregnancy, second trimester: Secondary | ICD-10-CM | POA: Insufficient documentation

## 2022-06-10 DIAGNOSIS — Z3480 Encounter for supervision of other normal pregnancy, unspecified trimester: Secondary | ICD-10-CM

## 2022-06-10 DIAGNOSIS — Z363 Encounter for antenatal screening for malformations: Secondary | ICD-10-CM | POA: Insufficient documentation

## 2022-06-10 DIAGNOSIS — O34219 Maternal care for unspecified type scar from previous cesarean delivery: Secondary | ICD-10-CM | POA: Insufficient documentation

## 2022-06-10 DIAGNOSIS — Z3A20 20 weeks gestation of pregnancy: Secondary | ICD-10-CM | POA: Diagnosis not present

## 2022-06-14 ENCOUNTER — Ambulatory Visit: Payer: Medicaid Other | Attending: Obstetrics and Gynecology

## 2022-06-14 ENCOUNTER — Encounter: Payer: Medicaid Other | Admitting: Obstetrics and Gynecology

## 2022-06-22 ENCOUNTER — Encounter: Payer: Medicaid Other | Admitting: Advanced Practice Midwife

## 2022-06-24 DIAGNOSIS — F320 Major depressive disorder, single episode, mild: Secondary | ICD-10-CM | POA: Diagnosis not present

## 2022-07-07 ENCOUNTER — Encounter: Payer: Self-pay | Admitting: Obstetrics and Gynecology

## 2022-07-07 ENCOUNTER — Other Ambulatory Visit: Payer: Medicaid Other

## 2022-07-07 ENCOUNTER — Ambulatory Visit (INDEPENDENT_AMBULATORY_CARE_PROVIDER_SITE_OTHER): Payer: Medicaid Other | Admitting: Obstetrics and Gynecology

## 2022-07-07 VITALS — BP 113/71 | HR 85 | Wt 218.0 lb

## 2022-07-07 DIAGNOSIS — Z3480 Encounter for supervision of other normal pregnancy, unspecified trimester: Secondary | ICD-10-CM

## 2022-07-07 DIAGNOSIS — O34211 Maternal care for low transverse scar from previous cesarean delivery: Secondary | ICD-10-CM

## 2022-07-07 DIAGNOSIS — Z98891 History of uterine scar from previous surgery: Secondary | ICD-10-CM

## 2022-07-07 DIAGNOSIS — Z3A24 24 weeks gestation of pregnancy: Secondary | ICD-10-CM

## 2022-07-07 MED ORDER — PREPLUS 27-1 MG PO TABS: 1.0000 | ORAL_TABLET | Freq: Every day | ORAL | 13 refills | Status: DC

## 2022-07-07 MED ORDER — PANTOPRAZOLE SODIUM 40 MG PO TBEC: 40.0000 mg | DELAYED_RELEASE_TABLET | Freq: Every day | ORAL | 3 refills | Status: DC

## 2022-07-07 NOTE — Progress Notes (Signed)
Pt states she is having occ sharp pain in vaginal area.

## 2022-07-07 NOTE — Progress Notes (Signed)
   PRENATAL VISIT NOTE  Subjective:  Phyllis Kidd is a 26 y.o. G3P2002 at [redacted]w[redacted]d being seen today for ongoing prenatal care.  She is currently monitored for the following issues for this low-risk pregnancy and has History of C-section; GERD (gastroesophageal reflux disease); Healthcare maintenance; Contraception management; Need for hepatitis C screening test; Secondary amenorrhea; Irregular menses; Acute stress reaction; Supervision of other normal pregnancy, antepartum; History of ELISA positive for HSV; and History of VBAC on their problem list.  Patient reports no complaints.  Contractions: Not present. Vag. Bleeding: None.  Movement: Present. Denies leaking of fluid.   The following portions of the patient's history were reviewed and updated as appropriate: allergies, current medications, past family history, past medical history, past social history, past surgical history and problem list.   Objective:   Vitals:   07/07/22 1533  BP: 113/71  Pulse: 85  Weight: 218 lb (98.9 kg)    Fetal Status: Fetal Heart Rate (bpm): 150 Fundal Height: 25 cm Movement: Present     General:  Alert, oriented and cooperative. Patient is in no acute distress.  Skin: Skin is warm and dry. No rash noted.   Cardiovascular: Normal heart rate noted  Respiratory: Normal respiratory effort, no problems with respiration noted  Abdomen: Soft, gravid, appropriate for gestational age.  Pain/Pressure: Present     Pelvic: Cervical exam deferred        Extremities: Normal range of motion.     Mental Status: Normal mood and affect. Normal behavior. Normal judgment and thought content.   Assessment and Plan:  Pregnancy: G3P2002 at [redacted]w[redacted]d 1. Supervision of other normal pregnancy, antepartum Patient is doing well without complaints Third trimester labs with glucola next visit - Prenatal Vit-Fe Fumarate-FA (PREPLUS) 27-1 MG TABS; Take 1 tablet by mouth daily.  Dispense: 30 tablet; Refill: 13  2. History of  C-section   3. History of VBAC Patient desires another TOLAC Plans depo-provera for contraception  Preterm labor symptoms and general obstetric precautions including but not limited to vaginal bleeding, contractions, leaking of fluid and fetal movement were reviewed in detail with the patient. Please refer to After Visit Summary for other counseling recommendations.   Return in about 4 weeks (around 08/04/2022) for in person, ROB, Low risk, 2 hr glucola next visit.  Future Appointments  Date Time Provider Department Center  08/04/2022  8:35 AM Corlis Hove, NP [facility] None    Catalina Antigua, MD

## 2022-08-04 ENCOUNTER — Encounter: Payer: Self-pay | Admitting: Student

## 2022-08-04 ENCOUNTER — Other Ambulatory Visit: Payer: Medicaid Other

## 2022-08-04 ENCOUNTER — Ambulatory Visit (INDEPENDENT_AMBULATORY_CARE_PROVIDER_SITE_OTHER): Payer: Medicaid Other | Admitting: Student

## 2022-08-04 VITALS — BP 115/77 | HR 87 | Wt 221.6 lb

## 2022-08-04 DIAGNOSIS — Z98891 History of uterine scar from previous surgery: Secondary | ICD-10-CM

## 2022-08-04 DIAGNOSIS — Z3483 Encounter for supervision of other normal pregnancy, third trimester: Secondary | ICD-10-CM

## 2022-08-04 DIAGNOSIS — Z3480 Encounter for supervision of other normal pregnancy, unspecified trimester: Secondary | ICD-10-CM

## 2022-08-04 DIAGNOSIS — Z3493 Encounter for supervision of normal pregnancy, unspecified, third trimester: Secondary | ICD-10-CM | POA: Diagnosis not present

## 2022-08-04 DIAGNOSIS — Z3A28 28 weeks gestation of pregnancy: Secondary | ICD-10-CM

## 2022-08-04 NOTE — Progress Notes (Signed)
Patient presents for ROB visit. No concerns at this time.   

## 2022-08-04 NOTE — Patient Instructions (Addendum)
Follow-up Recommendations  Prenatal Vitamin with 400 to 1,000 mcg of folic acid a day Melatonin nightly 3.5-5mg  and/or Magnesium 400mg  ** FYI: Magnesium: Take (400 mg total) by mouth at bedtime. If that amount causes loose stools in the am, switch to 200mg  daily at bedtime**   Safe Medications in Pregnancy   Acne: Benzoyl Peroxide Salicylic Acid  Backache/Headache: Tylenol: 2 regular strength every 4 hours OR              2 Extra strength every 6 hours  Colds/Coughs/Allergies: Benadryl (alcohol free) 25 mg every 6 hours as needed Breath right strips Claritin Cepacol throat lozenges Chloraseptic throat spray Cold-Eeze- up to three times per day Cough drops, alcohol free Flonase (by prescription only) Guaifenesin Mucinex Robitussin DM (plain only, alcohol free) Saline nasal spray/drops Sudafed (pseudoephedrine) & Actifed ** use only after [redacted] weeks gestation and if you do not have high blood pressure Tylenol Vicks Vaporub Zinc lozenges Zyrtec   Constipation: Colace Ducolax suppositories Fleet enema Glycerin suppositories Metamucil Milk of magnesia Miralax Senokot Smooth move tea  Diarrhea: Kaopectate Imodium A-D  *NO pepto Bismol  Hemorrhoids: Anusol Anusol HC Preparation H Tucks  Indigestion: Tums Maalox Mylanta Zantac  Pepcid  Insomnia: Benadryl (alcohol free) 25mg  every 6 hours as needed Tylenol PM Unisom, no Gelcaps  Leg Cramps: Tums MagGel  Nausea/Vomiting:  Bonine Dramamine Emetrol Ginger extract Sea bands Meclizine  Nausea medication to take during pregnancy:  Unisom (doxylamine succinate 25 mg tablets) Take one tablet daily at bedtime. If symptoms are not adequately controlled, the dose can be increased to a maximum recommended dose of two tablets daily (1/2 tablet in the morning, 1/2 tablet mid-afternoon and one at bedtime). Vitamin B6 100mg  tablets. Take one tablet twice a day (up to 200 mg per day).  Skin Rashes: Aveeno  products Benadryl cream or 25mg  every 6 hours as needed Calamine Lotion 1% cortisone cream  Yeast infection: Gyne-lotrimin 7 Monistat 7   **If taking multiple medications, please check labels to avoid duplicating the same active ingredients **take medication as directed on the label ** Do not exceed 4000 mg of tylenol in 24 hours **Do not take medications that contain aspirin or ibuprofen      AREA PEDIATRIC/FAMILY PRACTICE PHYSICIANS  [place]outheast [place] ([phone]) [facility] Deirdre Priesthambliss, MD; Lum BabeEniola, MD; Sheffield SliderHale, MD; Leveda AnnaHensel, MD; McDiarmid, MD; Jerene BearsMcIntyer, MD; Jennette KettleNeal, MD; Gwendolyn GrantWalden, MD [address] [place], [place] [address] [phone] Mon-Fri 8:30-12:30, 1:30-5:00 Providers come to see babies at [facility] Accepting [facility] providers who accept newborns: Docia ChuckKoirala, MD; Kateri PlummerMorrow, MD; Paulino RilyWolters, MD [address], [place], [place] [address] [phone] Mon-Fri 8:00-5:30 Babies seen by providers at [facility] Does NOT accept Medicaid Please call early in hospitalization for appointment (limited availability)  [facility] BeachMulberry, MD [address] [place], [place] [phone] Mon, Tue, Thur, Fri 8:30-5:00, Wed 10:00-7:00 (closed 1-2pm) Babies seen by [facility] providers Accepting Medicaid Donnie Coffinubin - Pediatrician Donnie Coffinubin, MD [address], [place], [place] [address] [phone] Mon-Fri 8:30-5:00, Sat 8:30-12:00 Provider comes to see babies at [facility] Accepting Medicaid Must have been referred from current patients or contacted office prior to delivery [facility]) Manson PasseyBrown, MD; Ave Filterhandler, MD; Luna FuseEttefagh, MD; Kennedy BuckerGrant, MD; Konrad DoloresLester, MD; Kathlene NovemberMcCormick, MD; Jenne CampusMcQueen, MD; Lubertha SouthProse, MD; Wynetta EmerySimha, MD; Duffy RhodyStanley, MD; Gerre CouchStryffeler, NP; Shirl Harrisebben, NP [address],  [place],  [place] [address] [phone] Mon, Tue, Thur, Fri 8:30-5:30, Wed 9:30-5:30, Sat 8:30-12:30 Babies seen by [facility] providers Accepting Medicaid Only accepting infants of first-time parents or siblings of current patients Hospital discharge coordinator will make follow-up appointment Cyril Mourning [address] [place], [place]  [address] [phone]   Fax - [phone] [facility] [address], [place], [place]  [address] Phone - [phone]   Fax - [phone] Lucio Edward [address], [address] [place], [place]  [address] [phone]  [place] [phone]) [facility] Jenne Pane, MD; Alita Chyle, MD; Princella Ion, MD; MD; Earlene Plater, MD; Jamesetta Orleans, MD; Alvera Novel, MD; Clarene Duke, MD; Rana Snare, MD; Carmon Ginsberg, MD; Alinda Money, MD; Hosie Poisson, MD; Mayford Knife, MD [address], [place], [place] [address] [phone] Mon-Fri 8:30-5:00 (extended evenings Mon-Thur as needed), Sat-Sun 10:00-1:00 Providers come to see babies at [facility] Accepting Medicaid for families of first-time babies and families with all children in the household age 55 and under. Must register with office prior to making appointment (M-F only). [facility] Suezanne Jacquet, NP; Lynelle Doctor, MD; Susann Givens, MD; [place], [place] [address] [place], [place] [address] [phone] Mon-Fri 8:00-5:00 Babies seen by providers at [facility] Does NOT accept Medicaid/Commercial Insurance Only [facility] [place] ([facility])  Holly Bodily, MD; Zachery Dauer, MD; Stefan Church, MD; Sabino Dick, MD; Quitman Livings, MD; Farris Has, MD; Gaynell Face, MD; Betha Loa, MD; Colon Flattery, MD; Clifton James, MD [address] [place] [place], [place] [address] [phone] Mon-Fri 8:30-5:30, Sat (Oct.-Mar.) 9:00-1:00 Babies seen by providers at [facility] Accepting [facility]  [place] [phone]) [facility] Nestle, MD; Sheliah Hatch, MD [address], [place],  [place] [phone] Mon-Fri 8:30-5:00, Sat 8:30-12:00 Providers come to see babies at [facility] Does NOT accept [facility] at Lutricia [place], [place]; Tracie Harrier, MD; [place], [place]; Wynelle Link, MD; Azucena Cecil, MD [address], [place], [place] [address] [phone] Mon-Fri 8:00-5:00 Babies seen by providers at [facility] Does NOT accept Medicaid Only accepting babies of parents who are patients Please call early in hospitalization for appointment (limited availability) [facility] Pediatricians Chestine Spore, MD; Abran Cantor, MD; Early Osmond, MD; Cherre Huger, NP; Hyacinth Meeker, MD; Dwan Bolt, MD; Jarold Motto, NP; Dario Guardian, MD; Talmage Nap, MD; Maisie Fus, MD; Pricilla Holm, MD; Tama High, MD [address] [place], [place], [place] [address] [phone] Mon-Fri 8:00-5:00, Sat 9:00-12:00 Providers come to see babies at [facility] Does NOT accept [facility] [phone]) [facility] at [facility] providers accepting new patients: Drema Pry, NP; Delena Serve, PA [address] [place], [place] [phone] Mon-Fri 8:00-5:00 Babies seen by providers at [facility] Does NOT accept Medicaid Only accepting babies of parents who are patients Please call early in hospitalization for appointment (limited availability) [facility] Cardell Peach, MD; Nash Dimmer, MD [address] [place]., [place], [place] [address] [phone] (press 1 to schedule appointment) Mon-Fri 8:00-5:00 Providers come to see babies at [facility] Does NOT accept [facility], MD [address], [place], [place] [address] [phone] Mon-Fri 8:30-5:00 (lunch 12:30-1:00), extended hours by appointment only Wed 5:00-6:30 Babies seen by [facility] providers Accepting [facility], MD; [place], MD; Hassan Rowan, MD [address] [place], [place], [place] [phone] Mon-Fri 8:00-5:00 Babies seen by [facility]  providers Does NOT accept [facility] Boykin Peek, MD; [place], MD; [place], [place] [address] [place], [place] [address] [phone] Mon-Fri 8:00-5:00 Babies seen by [facility] providers Does NOT accept [facility] [place], [place]; [place], [place]; [place], [place]; Avis Epley, MD;  Vonna Kotyk, MD; Clance Boll, MD; Stevphen [place], NP; Arvilla Market, NP; Ann Maki, NP; Otis Dials, NP; Vaughan Basta, MD; Eartha Inch, MD [address] [place], [place] [address] [phone] Mon-Fri 8:30-5:00, Sat 10:00-1:00 Providers come to see babies at [facility] Does NOT accept Medicaid Free prenatal information session Tuesdays at 4:45pm [facility] [place], MD; [place], [place]; [place], [place]; [place], [place] [address] [place] [address] [phone] Mon-Fri 7:30-5:30 Babies seen by [facility] providers [facility] Doctor [address], [place], [place]  [address] [phone]   Fax - [phone]  [place] [phone] & [phone]) [facility], MD [address], [place], [place] [address] [phone] Mon-Thur 8:00-6:00 Providers come to see babies at [facility] Accepting Medicaid [facility] Dareen Piano, NP; Cyndia Bent, MD; [place], [place]; [place], [place] [address] [place], [place] [address] [phone] Mon-Thur 7:30-7:30, Fri 7:30-4:30 Babies seen by [facility] providers Accepting [facility] Juanito Doom, MD; Janene Harvey, NP; Vonita Moss, MD [address], [place], [place] [address] [phone] Mon-Fri 8:30-5:00, Sat 8:30-12:00 Providers come to see babies at [facility] Accepting Medicaid Must have "Meet & Greet" appointment at office prior to delivery [facility]) Marlow Baars, MD; Earlene Plater, MD; Lucretia Roers, MD [address], [place], [place] [address] [phone] Mon-Wed 8:00-6:00,  Thur-Fri 8:00-5:00, Sat 9:00-12:00 Providers come to see babies at [facility] Does NOT accept Medicaid Only accepting siblings of current patients [facility]  [address], [place], [place]  [address] [phone]   Fax - [phone] [facility] at [facility] [address], [place], [place]  [address] [phone]   Fax - [phone]  [place] [phone] & [phone]) Adult nurse [facility], [place]; [place], [place] [address] [place], [place] [address] [phone] Mon-Fri 7:00-5:00 Babies seen by [facility] providers Does NOT accept Medicaid [facility] Medicine Tara [place], MD; [place], [place]; [place], [place] [address], [place], [place] [address] [phone] Mon-Fri 8:00-5:00 Babies seen by [facility] providers Accepting [facility] - Dorann Lodge [place], MD; [place], [place]; Mason, NP; [place], [place] [address] [place], [place], [place] [address] [phone] Mon-Fri 8:00-5:00 Babies seen by providers at [facility] Accepting [facility] [place] [phone]) [facility] at [facility] [place], [place] [address] [place], [place] [address] [phone] Mon-Fri 8:00-5:00 Babies seen by [facility] providers Does NOT accept Medicaid Limited availability, please call early in hospitalization to schedule follow-up [facility] Jeanelle [place]alling, [place]; Eddie Candle, MD; Normand Sloop, MD; [place], [place]; Constance Goltz, MD; [place], [place] [address] [facility] [address], [place], [place] [address] [phone] Mon-Fri 8:30-5:00, Sat 9:00-12:00 Babies seen by providers at [facility] Accepting Medicaid Please register online then schedule online or call office [email or web address] [facility] - [facility] at [facility]) Durene Cal, NP; Lucianne Muss, MD; [place],  [place] [address], [place], [place] [address] [phone] Mon-Fri 8:00-5:00 Babies seen by providers at [facility] Accepting [facility] - [place] ([facility] [place]) [place], MD; Reed Breech, NP; Shelva Majestic, MD [address], [place], [place] [address] [phone] Mon-Fri 8:00-5:30, Sat&Sun by appointment (phones open at 8:30) Babies seen by [facility] providers Accepting Medicaid Must be a first-time baby or sibling of current patient [facility] - [facility]  [address], [place], [place]  [address] [phone]  Fax - [phone]  [place] ([address] & [phone]) [facility] [place], [place]; [place], [place]; [place], MD; [place], [place]; [place], MD [address] [place], [place] [address] [phone] Mon-Thur 8:00-7:00, Fri 8:00-5:00, Sat 8:00-12:00, Sun 9:00-12:00 Babies seen by [facility] providers Accepting Medicaid [facility] Family Medicine at Liana Gerold, MD; Gaynell Face, MD; [facility], MD [address], [place], [place] [address] [phone] Mon-Thur 8:00-5:00 Babies seen by providers at [facility] Accepting Medicaid [facility] - Family Medicine at D[place]hey Sawyer, MD; [place], MD; Madilyn Fireman, MD; Melvyn Neth, MD; List, MD; Lazarus Salines, MD; Gaynell Face, MD; Berneda Rose, MD; Flora Lipps, MD; Beryl Meager, MD; Luther Redo, MD; Lavonia Drafts, MD; Kellie Simmering, MD [address] [place]rian [place], [place] [address] [phone] Mon-Fri 8:00-5:30, Sat (Oct.-Mar.) 9:00-1:00 Babies seen by providers at [facility] Accepting Medicaid Must fill out new patient packet, available online at [email or web address] Pediatrics - [place] [facility] at [facility]) Spero Geralds, NP; Tiburcio Pea, NP; Tresa Endo, NP; Whitney Post, MD; [place], [place]; Hennie Duos, MD; [place], MD; Kavin Leech, NP [address], [place], [place] [address] [phone] Mon-Thur  8:00-5:30, Fri 8:00-5:00 Babies seen by providers at [facility] Accepting [facility]  [place] [phone]) [facility] [place], [place]; [place], MD; [place], MD; [place], [place] [address] [place], [place] [address] [phone] Mon-Fri 8:00-5:00 Babies seen by providers at [facility] Accepting [facility] [phone]) [place] [facility], [place]; Lenise Arena, MD; [place], [place] [address], [place], [place] [address] [phone] Mon-Fri 8:00-5:00 Babies seen by providers at [facility] Does NOT accept Medicaid Limited appointment availability, please call early in hospitalization  [facility], [place]; [place], MD [address], [place], [place] [address] [phone] Mon-Fri 8:00-5:00 Babies seen by [facility] providers Does NOT accept [facility] - [facility] [place], MD; Ninetta Lights, MD; [place], [place]; [place], MD [address], [place], [place] [address] [phone] Mon-Fri 8:00-5:00 After hours clinic [facility] [place], [place] [address]) [phone] Mon-Fri 5:00-8:00, Sat 12:00-6:00, Sun 10:00-4:00 Babies seen by [facility] providers Accepting [facility] Family Medicine at [facility] [address] [place], [place]  [address] [phone]   Fax - [phone]  [place] [phone]) Adult nurse HealthCare at [facility], MD [address] [place], [place], [place] [address] [phone] Mon-Fri 8:00-5:00 Babies seen by [facility] providers Does NOT accept Medicaid [facility] Family [facility] - [place] [facility] at [place]) Rene Kocher, MD [address], [place], [place] [address] [phone] Mon-Thur 8:00-7:00, Fri 8:00-5:00, Sat 8:00-12:00 Babies seen by providers at [facility] Accepting Medicaid - but does not have vaccinations in office (must be received elsewhere) Limited availability,  please call early in hospitalization  [place] [phone]) [facility]  Wyvonne Lenz, MD [address], [place] [address] [phone]  Fax [phone]  [facility]  Lyndel Safe, MD, [place], [place], [place], [place] [address] [place], [place] [address] [phone] [facility]  [address] Sherian Maroon [place], [place] [address] [phone] [address] [place], [place] [address] [phone] [facility] Office)  [facility] [address], [place], [place] [address] [phone] [facility] [address] [place], [place], [place] [address] [phone] [facility] [address], [place], [place] [address] [phone] [facility] [address] [place], [place] [address] [phone] Lucas Mallow  [facility] [address], [place], [place] [address] [phone] [facility] [address], [place], [place] [address] [phone] [facility]  [address] [place], [place] [address] [phone] Dr. Belia Heman. [place] [address], [place], [place] [address] [phone] [facility] [address], [address], [place], [place] [address] [phone] [facility] [address] [place], [place] [address] [phone]

## 2022-08-04 NOTE — Progress Notes (Signed)
   PRENATAL VISIT NOTE  Subjective:  Phyllis Kidd is a 26 y.o. G3P2002 at [redacted]w[redacted]d being seen today for ongoing prenatal care.  She is currently monitored for the following issues for this low-risk pregnancy and has History of C-section; GERD (gastroesophageal reflux disease); Healthcare maintenance; Contraception management; Need for hepatitis C screening test; Secondary amenorrhea; Irregular menses; Acute stress reaction; Supervision of other normal pregnancy, antepartum; History of ELISA positive for HSV; and History of VBAC on their problem list.  Patient reports  inability to take PNV due to finances and difficulty sleeping at night due to baby activity .  Contractions: Irritability. Vag. Bleeding: None.  Movement: Present. Denies leaking of fluid.   The following portions of the patient's history were reviewed and updated as appropriate: allergies, current medications, past family history, past medical history, past social history, past surgical history and problem list.   Objective:   Vitals:   08/04/22 0824  BP: 115/77  Pulse: 87  Weight: 221 lb 9.6 oz (100.5 kg)    Fetal Status: Fetal Heart Rate (bpm): 152 Fundal Height: 27 cm Movement: Present     General:  Alert, oriented and cooperative. Patient is in no acute distress.  Skin: Skin is warm and dry. No rash noted.   Cardiovascular: Normal heart rate noted  Respiratory: Normal respiratory effort, no problems with respiration noted  Abdomen: Soft, gravid, appropriate for gestational age.  Pain/Pressure: Present     Pelvic: Cervical exam deferred        Extremities: Normal range of motion.  Edema: Trace  Mental Status: Normal mood and affect. Normal behavior. Normal judgment and thought content.   Assessment and Plan:  Pregnancy: G3P2002 at [redacted]w[redacted]d 1. Supervision of other normal pregnancy, antepartum - Doing well, frequent and vigorous fetal movement - PNV samples provided, discussed alternative options OTC.  - Provided OTC  options to assist with sleep  2. [redacted] weeks gestation of pregnancy - Third trimester labs today -declined tdap  3. History of C-section 4. History of VBAC - Patient desires another TOLAC  Preterm labor symptoms and general obstetric precautions including but not limited to vaginal bleeding, contractions, leaking of fluid and fetal movement were reviewed in detail with the patient. Please refer to After Visit Summary for other counseling recommendations.   Return in about 2 weeks (around 08/18/2022) for LOB, IN-PERSON.  No future appointments.   Corlis Hove, NP

## 2022-08-05 LAB — CBC
Hematocrit: 32.9 % — ABNORMAL LOW (ref 34.0–46.6)
Hemoglobin: 11.1 g/dL (ref 11.1–15.9)
MCH: 29.9 pg (ref 26.6–33.0)
MCHC: 33.7 g/dL (ref 31.5–35.7)
MCV: 89 fL (ref 79–97)
Platelets: 266 10*3/uL (ref 150–450)
RBC: 3.71 x10E6/uL — ABNORMAL LOW (ref 3.77–5.28)
RDW: 11.8 % (ref 11.7–15.4)
WBC: 8 10*3/uL (ref 3.4–10.8)

## 2022-08-05 LAB — GLUCOSE TOLERANCE, 2 HOURS W/ 1HR
Glucose, 1 hour: 110 mg/dL (ref 70–179)
Glucose, 2 hour: 94 mg/dL (ref 70–152)
Glucose, Fasting: 78 mg/dL (ref 70–91)

## 2022-08-05 LAB — RPR: RPR Ser Ql: NONREACTIVE

## 2022-08-05 LAB — HIV ANTIBODY (ROUTINE TESTING W REFLEX): HIV Screen 4th Generation wRfx: NONREACTIVE

## 2022-08-08 NOTE — L&D Delivery Note (Signed)
OB/GYN Faculty Practice Delivery Note  Phyllis Kidd is a 27 y.o. [id number] s/p VBAC at [redacted]w[redacted]d. She was admitted for IOL for DFM.   ROM: 3h 37m with clear fluid GBS Status:  Positive/-- (02/28 1134) Maximum Maternal Temperature:  Temp (24hrs), Avg:97.8 F (36.6 C), Min:97.4 F (36.3 C), Max:98.4 F (36.9 C)    Labor Progress: Patient arrived at 3 cm dilation and was induced with Pit and AROM.   Delivery Date/Time: 10/26/2022 at 0710 Delivery: Called to room and patient was complete and pushing. Head delivered in ROA position. Tight nuchal cord present. Shoulder and body delivered in usual fashion. Infant with spontaneous cry, placed on mother's abdomen, dried and stimulated. Cord clamped x 2 after 1-minute delay, and cut by FOB. Cord blood drawn. Placenta delivered spontaneously with gentle cord traction. Fundus firm with massage and Pitocin. Labia, perineum, vagina, and cervix inspected with hemostatic right labial .   Placenta: spontaneous, intact, 3 vessel cord Complications: None Lacerations: Right labial  EBL: 39 mL Analgesia: Epidural    Infant: APGAR (1 MIN): 7   APGAR (5 MINS): 9   APGAR (10 MINS):    Weight: Pending   Gifford Shave, MD  OB Fellow  10/26/2022 7:29 AM

## 2022-08-18 ENCOUNTER — Telehealth (INDEPENDENT_AMBULATORY_CARE_PROVIDER_SITE_OTHER): Payer: Medicaid Other | Admitting: Obstetrics

## 2022-08-18 ENCOUNTER — Encounter: Payer: Self-pay | Admitting: Obstetrics

## 2022-08-18 DIAGNOSIS — O99613 Diseases of the digestive system complicating pregnancy, third trimester: Secondary | ICD-10-CM

## 2022-08-18 DIAGNOSIS — K219 Gastro-esophageal reflux disease without esophagitis: Secondary | ICD-10-CM

## 2022-08-18 DIAGNOSIS — O34219 Maternal care for unspecified type scar from previous cesarean delivery: Secondary | ICD-10-CM

## 2022-08-18 DIAGNOSIS — Z98891 History of uterine scar from previous surgery: Secondary | ICD-10-CM

## 2022-08-18 DIAGNOSIS — Z3480 Encounter for supervision of other normal pregnancy, unspecified trimester: Secondary | ICD-10-CM

## 2022-08-18 DIAGNOSIS — Z3A30 30 weeks gestation of pregnancy: Secondary | ICD-10-CM

## 2022-08-18 MED ORDER — PANTOPRAZOLE SODIUM 40 MG PO TBEC: 40.0000 mg | DELAYED_RELEASE_TABLET | Freq: Two times a day (BID) | ORAL | 5 refills | Status: DC

## 2022-08-18 NOTE — Progress Notes (Signed)
Subjective:  Phyllis Kidd is a 27 y.o. G3P2002 at [redacted]w[redacted]d being seen today for ongoing prenatal care.  She is currently monitored for the following issues for this low-risk pregnancy and has History of C-section; GERD (gastroesophageal reflux disease); Healthcare maintenance; Contraception management; Need for hepatitis C screening test; Secondary amenorrhea; Irregular menses; Acute stress reaction; Supervision of other normal pregnancy, antepartum; History of ELISA positive for HSV; and History of VBAC on their problem list.  Patient reports heartburn.  Contractions: Not present. Vag. Bleeding: None.  Movement: Present. Denies leaking of fluid.   The following portions of the patient's history were reviewed and updated as appropriate: allergies, current medications, past family history, past medical history, past social history, past surgical history and problem list. Problem list updated.  Objective:  There were no vitals filed for this visit.  Fetal Status:     Movement: Present     General:  Alert, oriented and cooperative. Patient is in no acute distress.  Skin: Skin is warm and dry. No rash noted.   Cardiovascular: Normal heart rate noted  Respiratory: Normal respiratory effort, no problems with respiration noted  Abdomen: Soft, gravid, appropriate for gestational age. Pain/Pressure: Present     Pelvic:  Cervical exam deferred        Extremities: Normal range of motion.  Edema: None  Mental Status: Normal mood and affect. Normal behavior. Normal judgment and thought content.   Urinalysis:      Assessment and Plan:  Pregnancy: G3P2002 at [redacted]w[redacted]d  1. Supervision of other normal pregnancy, antepartum  2. History of C-section  3. History of VBAC  4. Gastroesophageal reflux disease without esophagitis Rx: - pantoprazole (PROTONIX) 40 MG tablet; Take 1 tablet (40 mg total) by mouth 2 (two) times daily before a meal.  Dispense: 60 tablet; Refill: 5    Preterm labor symptoms and  general obstetric precautions including but not limited to vaginal bleeding, contractions, leaking of fluid and fetal movement were reviewed in detail with the patient. Please refer to After Visit Summary for other counseling recommendations.    Return in about 2 weeks (around 09/01/2022) for ROB.   Shelly Bombard, MD 08/18/2022

## 2022-08-18 NOTE — Progress Notes (Signed)
[facility] OB, c/o bad heartburns, the current Rx does not work.

## 2022-08-31 ENCOUNTER — Encounter: Payer: Self-pay | Admitting: Obstetrics

## 2022-08-31 ENCOUNTER — Ambulatory Visit (INDEPENDENT_AMBULATORY_CARE_PROVIDER_SITE_OTHER): Payer: Medicaid Other | Admitting: Obstetrics

## 2022-08-31 VITALS — BP 132/77 | HR 103 | Wt 223.0 lb

## 2022-08-31 DIAGNOSIS — Z98891 History of uterine scar from previous surgery: Secondary | ICD-10-CM

## 2022-08-31 DIAGNOSIS — Z3483 Encounter for supervision of other normal pregnancy, third trimester: Secondary | ICD-10-CM

## 2022-08-31 DIAGNOSIS — K219 Gastro-esophageal reflux disease without esophagitis: Secondary | ICD-10-CM

## 2022-08-31 DIAGNOSIS — Z3A31 31 weeks gestation of pregnancy: Secondary | ICD-10-CM

## 2022-08-31 DIAGNOSIS — Z3480 Encounter for supervision of other normal pregnancy, unspecified trimester: Secondary | ICD-10-CM

## 2022-08-31 NOTE — Progress Notes (Signed)
Subjective:  Phyllis Kidd is a 27 y.o. G3P2002 at [redacted]w[redacted]d being seen today for ongoing prenatal care.  She is currently monitored for the following issues for this low-risk pregnancy and has History of C-section; GERD (gastroesophageal reflux disease); Healthcare maintenance; Contraception management; Need for hepatitis C screening test; Secondary amenorrhea; Irregular menses; Acute stress reaction; Supervision of other normal pregnancy, antepartum; History of ELISA positive for HSV; and History of VBAC on their problem list.  Patient reports heartburn.  Contractions: Not present. Vag. Bleeding: None.  Movement: Present. Denies leaking of fluid.   The following portions of the patient's history were reviewed and updated as appropriate: allergies, current medications, past family history, past medical history, past social history, past surgical history and problem list. Problem list updated.  Objective:   Vitals:   08/31/22 1103  BP: 132/77  Pulse: (!) 103  Weight: 223 lb (101.2 kg)    Fetal Status: Fetal Heart Rate (bpm): 138   Movement: Present     General:  Alert, oriented and cooperative. Patient is in no acute distress.  Skin: Skin is warm and dry. No rash noted.   Cardiovascular: Normal heart rate noted  Respiratory: Normal respiratory effort, no problems with respiration noted  Abdomen: Soft, gravid, appropriate for gestational age. Pain/Pressure: Present     Pelvic:  Cervical exam deferred        Extremities: Normal range of motion.  Edema: Trace  Mental Status: Normal mood and affect. Normal behavior. Normal judgment and thought content.   Urinalysis:      Assessment and Plan:  Pregnancy: G3P2002 at [redacted]w[redacted]d  1. Supervision of other normal pregnancy, antepartum  2. History of C-section  3. History of VBAC  4. Gastroesophageal reflux disease without esophagitis - Protonix Rx    There are no diagnoses linked to this encounter. Preterm labor symptoms and general  obstetric precautions including but not limited to vaginal bleeding, contractions, leaking of fluid and fetal movement were reviewed in detail with the patient. Please refer to After Visit Summary for other counseling recommendations.   Return in about 2 weeks (around 09/14/2022) for ROB.   Shelly Bombard, MD 08/31/2022

## 2022-09-10 DIAGNOSIS — F320 Major depressive disorder, single episode, mild: Secondary | ICD-10-CM | POA: Diagnosis not present

## 2022-09-14 ENCOUNTER — Telehealth (INDEPENDENT_AMBULATORY_CARE_PROVIDER_SITE_OTHER): Payer: Medicaid Other | Admitting: Obstetrics

## 2022-09-14 ENCOUNTER — Encounter: Payer: Self-pay | Admitting: Obstetrics

## 2022-09-14 DIAGNOSIS — O34219 Maternal care for unspecified type scar from previous cesarean delivery: Secondary | ICD-10-CM

## 2022-09-14 DIAGNOSIS — Z98891 History of uterine scar from previous surgery: Secondary | ICD-10-CM

## 2022-09-14 DIAGNOSIS — Z3480 Encounter for supervision of other normal pregnancy, unspecified trimester: Secondary | ICD-10-CM

## 2022-09-14 DIAGNOSIS — Z3A33 33 weeks gestation of pregnancy: Secondary | ICD-10-CM

## 2022-09-14 DIAGNOSIS — O09293 Supervision of pregnancy with other poor reproductive or obstetric history, third trimester: Secondary | ICD-10-CM

## 2022-09-14 DIAGNOSIS — Z8619 Personal history of other infectious and parasitic diseases: Secondary | ICD-10-CM

## 2022-09-14 NOTE — Progress Notes (Signed)
   OBSTETRICS PRENATAL VIRTUAL VISIT ENCOUNTER NOTE  Provider location: [facility] for [place] at [facility]   Patient location: Home  I connected with Phyllis Kidd on 09/14/22 at 10:35 AM EST by MyChart Video Encounter and verified that I am speaking with the correct person using two identifiers. I discussed the limitations, risks, security and privacy concerns of performing an evaluation and management service virtually and the availability of in person appointments. I also discussed with the patient that there may be a patient responsible charge related to this service. The patient expressed understanding and agreed to proceed. Subjective:  Phyllis Kidd is a 27 y.o. G3P2002 at [redacted]w[redacted]d being seen today for ongoing prenatal care.  She is currently monitored for the following issues for this low-risk pregnancy and has History of C-section; GERD (gastroesophageal reflux disease); Healthcare maintenance; Contraception management; Need for hepatitis C screening test; Secondary amenorrhea; Irregular menses; Acute stress reaction; Supervision of other normal pregnancy, antepartum; History of ELISA positive for HSV; and History of VBAC on their problem list.  Patient reports  pelvic pressure .  Contractions: Not present. Vag. Bleeding: None.  Movement: Present. Denies any leaking of fluid.   The following portions of the patient's history were reviewed and updated as appropriate: allergies, current medications, past family history, past medical history, past social history, past surgical history and problem list.   Objective:  There were no vitals filed for this visit.  Fetal Status:     Movement: Present     General:  Alert, oriented and cooperative. Patient is in no acute distress.  Respiratory: Normal respiratory effort, no problems with respiration noted  Mental Status: Normal mood and affect. Normal behavior. Normal judgment and thought content.  Rest of physical exam deferred due to  type of encounter  Imaging: No results found.  Assessment and Plan:  Pregnancy: G3P2002 at [redacted]w[redacted]d 1. Supervision of other normal pregnancy, antepartum  2. History of C-section  3. History of VBAC  4. History of ELISA positive for HSV   Preterm labor symptoms and general obstetric precautions including but not limited to vaginal bleeding, contractions, leaking of fluid and fetal movement were reviewed in detail with the patient. I discussed the assessment and treatment plan with the patient. The patient was provided an opportunity to ask questions and all were answered. The patient agreed with the plan and demonstrated an understanding of the instructions. The patient was advised to call back or seek an in-person office evaluation/go to MAU at [facility] for any urgent or concerning symptoms. Please refer to After Visit Summary for other counseling recommendations.   I have spent a total of 10 minutes of non-face-to-face time, excluding clinical staff time, reviewing notes and preparing to see patient, ordering tests and/or medications, and counseling the patient.   No follow-ups on file.  Future Appointments  Date Time Provider [place]  09/28/2022 10:35 AM Chancy Milroy, MD [place] None  10/05/2022  9:55 AM Laury Deep, CNM [place] None  10/12/2022 10:35 AM Griffin Basil, MD [place] None  10/19/2022 10:35 AM Woodroe Mode, MD [place] None  10/26/2022  9:55 AM Laury Deep, CNM [place] None  11/02/2022 10:35 AM Chancy Milroy, MD [place] None    Baltazar Najjar, [place] for [facility], [place], [facility] 09/14/2022

## 2022-09-14 NOTE — Progress Notes (Signed)
TC to patient for virtual ROB. Reports significant vaginal pressure and pain.

## 2022-09-28 ENCOUNTER — Telehealth (INDEPENDENT_AMBULATORY_CARE_PROVIDER_SITE_OTHER): Payer: Medicaid Other | Admitting: Obstetrics and Gynecology

## 2022-09-28 ENCOUNTER — Encounter: Payer: Self-pay | Admitting: Obstetrics and Gynecology

## 2022-09-28 DIAGNOSIS — Z98891 History of uterine scar from previous surgery: Secondary | ICD-10-CM

## 2022-09-28 DIAGNOSIS — Z8619 Personal history of other infectious and parasitic diseases: Secondary | ICD-10-CM

## 2022-09-28 DIAGNOSIS — O34219 Maternal care for unspecified type scar from previous cesarean delivery: Secondary | ICD-10-CM

## 2022-09-28 DIAGNOSIS — Z3480 Encounter for supervision of other normal pregnancy, unspecified trimester: Secondary | ICD-10-CM

## 2022-09-28 DIAGNOSIS — O09293 Supervision of pregnancy with other poor reproductive or obstetric history, third trimester: Secondary | ICD-10-CM

## 2022-09-28 DIAGNOSIS — Z3A35 35 weeks gestation of pregnancy: Secondary | ICD-10-CM

## 2022-09-28 MED ORDER — VALACYCLOVIR HCL 500 MG PO TABS: 500.0000 mg | ORAL_TABLET | Freq: Two times a day (BID) | ORAL | 6 refills | Status: DC

## 2022-09-28 NOTE — Progress Notes (Signed)
   OBSTETRICS PRENATAL VIRTUAL VISIT ENCOUNTER NOTE  Provider location: [facility] for [place] at [facility]   Patient location: Home  I connected with Phyllis Kidd on 09/28/22 at 10:35 AM EST by MyChart Video Encounter and verified that I am speaking with the correct person using two identifiers. I discussed the limitations, risks, security and privacy concerns of performing an evaluation and management service virtually and the availability of in person appointments. I also discussed with the patient that there may be a patient responsible charge related to this service. The patient expressed understanding and agreed to proceed. Subjective:  Phyllis Kidd is a 27 y.o. G3P2002 at 61w6dbeing seen today for ongoing prenatal care.  She is currently monitored for the following issues for this low-risk pregnancy and has History of C-section; GERD (gastroesophageal reflux disease); Healthcare maintenance; Need for hepatitis C screening test; Acute stress reaction; Supervision of other normal pregnancy, antepartum; History of ELISA positive for HSV; and History of VBAC on their problem list.  Patient reports  general discomforts of pregnancy .  Contractions: Irritability. Vag. Bleeding: None.  Movement: Present. Denies any leaking of fluid.   The following portions of the patient's history were reviewed and updated as appropriate: allergies, current medications, past family history, past medical history, past social history, past surgical history and problem list.   Objective:  There were no vitals filed for this visit.  Fetal Status:     Movement: Present     General:  Alert, oriented and cooperative. Patient is in no acute distress.  Respiratory: Normal respiratory effort, no problems with respiration noted  Mental Status: Normal mood and affect. Normal behavior. Normal judgment and thought content.  Rest of physical exam deferred due to type of encounter  Imaging: No results  found.  Assessment and Plan:  Pregnancy: G3P2002 at 323w6d. Supervision of other normal pregnancy, antepartum Stable GBS and vaginal cultures next visit  2. History of VBAC Needs to sign TOLAC papers at next visit  3. History of ELISA positive for HSV  - valACYclovir (VALTREX) 500 MG tablet; Take 1 tablet (500 mg total) by mouth 2 (two) times daily.  Dispense: 60 tablet; Refill: 6  4. History of C-section See above  Preterm labor symptoms and general obstetric precautions including but not limited to vaginal bleeding, contractions, leaking of fluid and fetal movement were reviewed in detail with the patient. I discussed the assessment and treatment plan with the patient. The patient was provided an opportunity to ask questions and all were answered. The patient agreed with the plan and demonstrated an understanding of the instructions. The patient was advised to call back or seek an in-person office evaluation/[facility] or concerning [facility]. Please refer to After Visit Summary for other counseling recommendations.   I provided 8 minutes of face-to-face time during this encounter.  Return in about 1 week (around 10/05/2022) for OB visit, face to face, MD only.  Future Appointments  Date Time Provider [place]/28/2024  9:55 AM DaLaury DeepCNM [place]  10/12/2022 10:35 AM BaGriffin BasilMD [facility]  10/19/2022 10:35 AM ArWoodroe ModeMD [facility]  10/26/2022  9:55 AM DaLaury DeepCNM [facility]  11/02/2022 10:35 AM ErChancy MilroyMD [facility]    MiChancy MilroyMDPassaicor [facility]

## 2022-09-28 NOTE — Progress Notes (Signed)
TC to complete virtual. Pt reports good FM, occ abdominal "tightening" No LOF or VB Reports lower extremity edema that does not resolve after resting overnight. Denies HA unrelieved by tylenol, visual changes, or RUQ abd pain. Reports cold symptoms and sore throat currently. Discussed safe meds for symptoms and sent MyChart message with safe meds list.

## 2022-10-05 ENCOUNTER — Other Ambulatory Visit (HOSPITAL_COMMUNITY)
Admission: RE | Admit: 2022-10-05 | Discharge: 2022-10-05 | Disposition: A | Discharge status: Home / Self Care | Payer: Medicaid Other | Source: Ambulatory Visit | Attending: Obstetrics and Gynecology | Admitting: Obstetrics and Gynecology

## 2022-10-05 ENCOUNTER — Ambulatory Visit (INDEPENDENT_AMBULATORY_CARE_PROVIDER_SITE_OTHER): Payer: Medicaid Other | Admitting: Obstetrics and Gynecology

## 2022-10-05 ENCOUNTER — Encounter: Payer: Self-pay | Admitting: Obstetrics and Gynecology

## 2022-10-05 VITALS — BP 119/76 | HR 76 | Wt 225.0 lb

## 2022-10-05 DIAGNOSIS — B009 Herpesviral infection, unspecified: Secondary | ICD-10-CM

## 2022-10-05 DIAGNOSIS — Z3480 Encounter for supervision of other normal pregnancy, unspecified trimester: Secondary | ICD-10-CM | POA: Diagnosis not present

## 2022-10-05 DIAGNOSIS — O34211 Maternal care for low transverse scar from previous cesarean delivery: Secondary | ICD-10-CM

## 2022-10-05 DIAGNOSIS — Z3A36 36 weeks gestation of pregnancy: Secondary | ICD-10-CM

## 2022-10-05 DIAGNOSIS — O98513 Other viral diseases complicating pregnancy, third trimester: Secondary | ICD-10-CM

## 2022-10-05 DIAGNOSIS — Z98891 History of uterine scar from previous surgery: Secondary | ICD-10-CM

## 2022-10-05 NOTE — Patient Instructions (Signed)
Signs and Symptoms of Labor Labor is the body's natural process of moving the baby and the placenta out of the uterus. The process of labor usually starts when the baby is full-term, between 67 and 41 weeks of pregnancy. Signs and symptoms that you are close to going into labor As your body prepares for labor and the birth of your baby, you may notice the following symptoms in the weeks and days before true labor starts: Passing a small amount of thick, bloody mucus from your vagina. This is called normal bloody show or losing your mucus plug. This may happen more than a week before labor begins, or right before labor begins, as the opening of the cervix starts to widen (dilate). For some women, the entire mucus plug passes at once. For others, pieces of the mucus plug may gradually pass over several days. Your baby moving (dropping) lower in your pelvis to get into position for birth (lightening). When this happens, you may feel more pressure on your bladder and pelvic bone and less pressure on your ribs. This may make it easier to breathe. It may also cause you to need to urinate more often and have problems with bowel movements. Having "practice contractions," also called Braxton Hicks contractions or false labor. These occur at irregular (unevenly spaced) intervals that are more than 10 minutes apart. False labor contractions are common after exercise or sexual activity. They will stop if you change position, rest, or drink fluids. These contractions are usually mild and do not get stronger over time. They may feel like: A backache or back pain. Mild cramps, similar to menstrual cramps. Tightening or pressure in your abdomen. Other early symptoms include: Nausea or loss of appetite. Diarrhea. Having a sudden burst of energy, or feeling very tired. Mood changes. Having trouble sleeping. Signs and symptoms that labor has begun Signs that you are in labor may include: Having contractions that come  at regular (evenly spaced) intervals and increase in intensity. This may feel like more intense tightening or pressure in your abdomen that moves to your back. Contractions may also feel like rhythmic pain in your upper thighs or back that comes and goes at regular intervals. If you are delivering for the first time, this change in intensity of contractions often occurs at a more gradual pace. If you have given birth before, you may notice a more rapid progression of contraction changes. Feeling pressure in the vaginal area. Your water breaking (rupture of membranes). This is when the sac of fluid that surrounds your baby breaks. Fluid leaking from your vagina may be clear or blood-tinged. Labor usually starts within 24 hours of your water breaking, but it may take longer to begin. Some people may feel a sudden gush of fluid; others may notice repeatedly damp underwear. Follow these instructions at home:  When labor starts, or if your water breaks, call your health care provider or nurse care line. Based on your situation, they will determine when you should go in for an exam. During early labor, you may be able to rest and manage symptoms at home. Some strategies to try at home include: Breathing and relaxation techniques. Taking a warm bath or shower. Listening to music. Using a heating pad on the lower back for pain. If directed, apply heat to the area as often as told by your health care provider. Use the heat source that your health care provider recommends, such as a moist heat pack or a heating pad. Place a  towel between your skin and the heat source. Leave the heat on for 20-30 minutes. Remove the heat if your skin turns bright red. This is especially important if you are unable to feel pain, heat, or cold. You have a greater risk of getting burned. Contact a health care provider if: Your labor has started. Your water breaks. You have nausea, vomiting, or diarrhea. Get help right away  if: You have painful, regular contractions that are 5 minutes apart or less. Labor starts before you are [redacted] weeks along in your pregnancy. You have a fever. You have bright red blood coming from your vagina. You do not feel your baby moving. You have a severe headache with or without vision problems. You have chest pain or shortness of breath. These symptoms may represent a serious problem that is an emergency. Do not wait to see if the symptoms will go away. Get medical help right away. Call your local emergency services (911 in the [place] Do not drive yourself to the hospital. Summary Labor is your body's natural process of moving your baby and the placenta out of your uterus. The process of labor usually starts when your baby is full-term, between 68 and 40 weeks of pregnancy. When labor starts, or if your water breaks, call your health care provider or nurse care line. Based on your situation, they will determine when you should go in for an exam. This information is not intended to replace advice given to you by your health care provider. Make sure you discuss any questions you have with your health care provider. Document Revised: 12/08/2020 Document Reviewed: 12/08/2020 Elsevier Patient Education  [place].

## 2022-10-05 NOTE — Progress Notes (Signed)
   LOW-RISK PREGNANCY OFFICE VISIT Patient name: Phyllis Kidd MRN [id number]  Date of birth: 1996/03/09 Chief Complaint:   Routine Prenatal Visit  History of Present Illness:   Phyllis Kidd is a 26 y.o. G26P2002 female at 62w6dwith an Estimated Date of Delivery: 10/27/22 being seen today for ongoing management of a low-risk pregnancy.  Today she reports  mild swelling in BLE . Contractions: Irritability. Vag. Bleeding: None.  Movement: Present. denies leaking of fluid. Review of Systems:   Pertinent items are noted in HPI Denies abnormal vaginal discharge w/ itching/odor/irritation, headaches, visual changes, shortness of breath, chest pain, abdominal pain, severe nausea/vomiting, or problems with urination or bowel movements unless otherwise stated above. Pertinent History Reviewed:  Reviewed past medical,surgical, social, obstetrical and family history.  Reviewed problem list, medications and allergies. Physical Assessment:   Vitals:   10/05/22 0958  BP: 119/76  Pulse: 76  Weight: 225 lb (102.1 kg)  Body mass index is 39.86 kg/m.        Physical Examination:   General appearance: Well appearing, and in no distress  Mental status: Alert, oriented to person, place, and time  Skin: Warm & dry  Cardiovascular: Normal heart rate noted  Respiratory: Normal respiratory effort, no distress  Abdomen: Soft, gravid, nontender  Pelvic: Cervical exam deferred         Extremities: Edema: Mild pitting, slight indentation  Fetal Status: Fetal Heart Rate (bpm): 156 Fundal Height: 38 cm Movement: Present Presentation: Vertex  No results found for this or any previous visit (from the past 24 hour(s)).  Assessment & Plan:  1) Low-risk pregnancy G3P2002 at 361w6dith an Estimated Date of Delivery: 10/27/22   2) Supervision of other normal pregnancy, antepartum - Information provided on signs of labor  - Culture, beta strep (group b only) - Cervicovaginal ancillary only( [place] 3) History of VBAC - TOLAC consent signed with Dr. ErRip Harbouroday  4) Herpes simplex virus type 2 (HSV-2) infection affecting pregnancy in third trimester - Taking Valtrex daily  5) [redacted] weeks gestation of pregnancy     Meds: No orders of the defined types were placed in this encounter.  Labs/procedures today: GBS and GC/CT  Plan:  Continue routine obstetrical care   Reviewed: Preterm labor symptoms and general obstetric precautions including but not limited to vaginal bleeding, contractions, leaking of fluid and fetal movement were reviewed in detail with the patient.  All questions were answered. Has home bp cuff. Check bp weekly, let usKoreanow if >140/90.   Follow-up: Return in about 1 week (around 10/12/2022) for Return OB visit - Needs to sign VBAC consent.  Orders Placed This Encounter  Procedures   Culture, beta strep (group b only)   RoLaury DeepSN, CNM 10/05/2022 10:22 AM

## 2022-10-05 NOTE — Progress Notes (Signed)
Pt presents for ROB visit. No concerns at this time.

## 2022-10-06 ENCOUNTER — Encounter: Payer: Self-pay | Admitting: Obstetrics and Gynecology

## 2022-10-06 LAB — CERVICOVAGINAL ANCILLARY ONLY
Chlamydia: NEGATIVE
Comment: NEGATIVE
Comment: NORMAL
Neisseria Gonorrhea: NEGATIVE

## 2022-10-07 ENCOUNTER — Inpatient Hospital Stay (HOSPITAL_COMMUNITY)
Admission: AD | Admit: 2022-10-07 | Discharge: 2022-10-08 | Disposition: A | Discharge status: Home / Self Care | Payer: Medicaid Other | Attending: Obstetrics & Gynecology | Admitting: Obstetrics & Gynecology

## 2022-10-07 DIAGNOSIS — Z3480 Encounter for supervision of other normal pregnancy, unspecified trimester: Secondary | ICD-10-CM

## 2022-10-07 DIAGNOSIS — Z3A37 37 weeks gestation of pregnancy: Secondary | ICD-10-CM

## 2022-10-07 DIAGNOSIS — Z0371 Encounter for suspected problem with amniotic cavity and membrane ruled out: Secondary | ICD-10-CM

## 2022-10-07 NOTE — MAU Note (Signed)
Pt says SROM- 2330- while she was laying down- felt pressure - stood and fluid - clear - came out . [place]. No VE- on Wed - at appointment - all ok Has hx of HSV- no ourbreak during this preg.  Denies any S/S now.  Takes Valtrex daily. GBS- neg  No UC's

## 2022-10-08 ENCOUNTER — Encounter (HOSPITAL_COMMUNITY): Payer: Self-pay | Admitting: Obstetrics & Gynecology

## 2022-10-08 DIAGNOSIS — Z3A37 37 weeks gestation of pregnancy: Secondary | ICD-10-CM | POA: Diagnosis not present

## 2022-10-08 DIAGNOSIS — Z0371 Encounter for suspected problem with amniotic cavity and membrane ruled out: Secondary | ICD-10-CM | POA: Diagnosis not present

## 2022-10-08 LAB — POCT FERN TEST: POCT Fern Test: NEGATIVE

## 2022-10-08 LAB — CULTURE, BETA STREP (GROUP B ONLY): Strep Gp B Culture: POSITIVE — AB

## 2022-10-08 NOTE — MAU Provider Note (Signed)
Event Date/Time   First Provider Initiated Contact with Patient 10/08/22 0028       S: Ms. Phyllis Kidd is a 27 y.o. G3P2002 at [redacted]w[redacted]d who presents to MAU today complaining of leaking of fluid since this evening. She denies vaginal bleeding. She denies contractions. She reports normal fetal movement.    O: BP 114/73 (BP Location: Right Arm)   Pulse 73   Temp 98.6 F (37 C) (Oral)   Resp 18   Ht '5\' 3"'$  (1.6 m)   Wt 101.9 kg   LMP  (LMP Unknown)   SpO2 99%   BMI 39.79 kg/m  GENERAL: Well-developed, well-nourished female in no acute distress.  HEAD: Normocephalic, atraumatic.  CHEST: Normal effort of breathing, regular heart rate ABDOMEN: Soft, nontender, gravid PELVIC: Normal external female genitalia. Vagina is pink and rugated. Cervix with normal contour, no lesions. Normal discharge.  No pooling.    Fetal Monitoring: Baseline: 135 Variability: moderate Accelerations: 15x15 Decelerations: no Contractions: irregular  Results for orders placed or performed during the hospital encounter of 10/07/22 (from the past 24 hour(s))  POCT fern test     Status: Normal   Collection Time: 10/08/22 12:48 AM  Result Value Ref Range   POCT Fern Test Negative = intact amniotic membranes      A: SIUP at 358w2dMembranes intact  P: Discharge home Labor precautions  LaJorje GuildNP 10/08/2022 12:49 AM

## 2022-10-09 ENCOUNTER — Observation Stay
Admission: EM | Admit: 2022-10-09 | Discharge: 2022-10-09 | Disposition: A | Discharge status: Home / Self Care | Payer: Medicaid Other | Attending: Obstetrics and Gynecology | Admitting: Obstetrics and Gynecology

## 2022-10-09 ENCOUNTER — Encounter: Payer: Self-pay | Admitting: Obstetrics and Gynecology

## 2022-10-09 ENCOUNTER — Other Ambulatory Visit: Payer: Self-pay

## 2022-10-09 DIAGNOSIS — M545 Low back pain, unspecified: Secondary | ICD-10-CM | POA: Diagnosis not present

## 2022-10-09 DIAGNOSIS — Z79899 Other long term (current) drug therapy: Secondary | ICD-10-CM | POA: Insufficient documentation

## 2022-10-09 DIAGNOSIS — Z3A37 37 weeks gestation of pregnancy: Secondary | ICD-10-CM | POA: Diagnosis not present

## 2022-10-09 DIAGNOSIS — Z0371 Encounter for suspected problem with amniotic cavity and membrane ruled out: Secondary | ICD-10-CM | POA: Diagnosis present

## 2022-10-09 DIAGNOSIS — R102 Pelvic and perineal pain: Secondary | ICD-10-CM | POA: Diagnosis not present

## 2022-10-09 DIAGNOSIS — O99891 Other specified diseases and conditions complicating pregnancy: Secondary | ICD-10-CM | POA: Diagnosis not present

## 2022-10-09 DIAGNOSIS — Z3480 Encounter for supervision of other normal pregnancy, unspecified trimester: Secondary | ICD-10-CM

## 2022-10-09 DIAGNOSIS — N898 Other specified noninflammatory disorders of vagina: Secondary | ICD-10-CM | POA: Diagnosis not present

## 2022-10-09 DIAGNOSIS — O26893 Other specified pregnancy related conditions, third trimester: Secondary | ICD-10-CM | POA: Diagnosis present

## 2022-10-09 DIAGNOSIS — O0993 Supervision of high risk pregnancy, unspecified, third trimester: Secondary | ICD-10-CM | POA: Diagnosis not present

## 2022-10-09 LAB — URINALYSIS, COMPLETE (UACMP) WITH MICROSCOPIC
Bacteria, UA: NONE SEEN
Bilirubin Urine: NEGATIVE
Glucose, UA: NEGATIVE mg/dL
Hgb urine dipstick: NEGATIVE
Ketones, ur: NEGATIVE mg/dL
Leukocytes,Ua: NEGATIVE
Nitrite: NEGATIVE
Protein, ur: 30 mg/dL — AB
Specific Gravity, Urine: 1.027 (ref 1.005–1.030)
pH: 5 (ref 5.0–8.0)

## 2022-10-09 LAB — WET PREP, GENITAL
Clue Cells Wet Prep HPF POC: NONE SEEN
Sperm: NONE SEEN
Trich, Wet Prep: NONE SEEN
WBC, Wet Prep HPF POC: >=10 — AB (ref <10)
Yeast Wet Prep HPF POC: NONE SEEN

## 2022-10-09 LAB — RUPTURE OF MEMBRANE (ROM)PLUS: Rom Plus: NEGATIVE
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
## 2022-10-09 NOTE — Discharge Instructions (Signed)
Keep your next scheduled OB appointment. Drink plenty of water and get plenty of rest. Call your provider or return to th ED for any other concerns.

## 2022-10-09 NOTE — Progress Notes (Signed)
Patient discharged home, discharge instructions given, patient states understanding. Patient left floor in stable condition, denies any other needs at this time. Patient to keep next scheduled OB appointment 

## 2022-10-09 NOTE — OB Triage Note (Signed)
Pt arrived with complaints on a gush of fluid two nights ago at 0000. One time gush of fluid , none since then. Pt denies contractions or vaginal bleeding. Pt is 37-3 G3P2 Hx c/s x1 followed by VBAC. Plans to TOLAC this pregnancy. [facility] that was negative but wanted to come here for a second opinion. Pt states baby is moving and has constant pressure in her perineum. Monitors applied and accessing. CNM ad bedside obtaining labs.

## 2022-10-10 ENCOUNTER — Encounter: Payer: Self-pay | Admitting: Obstetrics and Gynecology

## 2022-10-10 ENCOUNTER — Inpatient Hospital Stay (HOSPITAL_COMMUNITY)
Admission: AD | Admit: 2022-10-10 | Discharge: 2022-10-10 | Disposition: A | Discharge status: Home / Self Care | Payer: Medicaid Other | Attending: Obstetrics & Gynecology | Admitting: Obstetrics & Gynecology

## 2022-10-10 ENCOUNTER — Encounter (HOSPITAL_COMMUNITY): Payer: Self-pay | Admitting: Obstetrics & Gynecology

## 2022-10-10 DIAGNOSIS — O471 False labor at or after 37 completed weeks of gestation: Secondary | ICD-10-CM | POA: Insufficient documentation

## 2022-10-10 DIAGNOSIS — Z3480 Encounter for supervision of other normal pregnancy, unspecified trimester: Secondary | ICD-10-CM

## 2022-10-10 DIAGNOSIS — Z3A37 37 weeks gestation of pregnancy: Secondary | ICD-10-CM

## 2022-10-10 DIAGNOSIS — O479 False labor, unspecified: Secondary | ICD-10-CM

## 2022-10-10 NOTE — MAU Note (Signed)
.  Phyllis Kidd is a 27 y.o. at 63w4dhere in MAU reporting: ctx that started yesterday, states they are now more intense and closer but she has not been timing them . Denies Vb or LOF, +FM   Pain score: 10

## 2022-10-10 NOTE — Discharge Instructions (Signed)
The MilesCircuit This circuit takes at least 90 minutes to complete so clear your schedule and make mental preparations so you can relax in your environment.  The second step requires a lot of pillows so gather them up before beginning.  Before starting, you should empty your bladder! Have a nice drink nearby, and make sure it has a straw! If you are having contractions, this circuit should be done through contractions, try not to change positions between steps.  Step One: Open-kneeChest Stay in this position for 30 minutes, start in cat/cow, then drop your chest as low as you can to the bed or the floor and your bottom as high as you can. Knees should be fairly wide apart, and the angle between the torso/thighs should be wider than 90 degrees. Wiggle around, prop with lots of pillows and use this time to get totally relaxed. This position allows the baby to scoot out of the pelvis a bit and gives them room to rotate, shift their head position, etc. If the pregnant person finds it helpful, careful positioning with a rebozo under the belly, with gentle tension from a support person behind can help maintain this position for the full 30 minutes.  Step Two:ExaggeratedLeft SideLying Roll to your left side, bringing your top leg as high as possible and keeping your bottom leg straight. Roll forward as much as possible, again using a lot of pillows. Sink into the bed and relax some more. If you fall asleep, that's totally okay and you can stay there! If not, stay here for at least another half an hour. Try and get your top right leg up towards your head and get as rolled over onto your belly as much as possible. If you repeat the circuit during labor, try alternating left and right sides. We know the photo the left is actually right side... just flip the image in your head.  Step Three: Moving and Lunges Lunge, walk stairs facing sideways, 2 at a time, (have a spotter  downstairs of you!), take a walk outside with one foot on the curb and the other on the street, sit on a birth ball and hula- anything that's upright and putting your pelvis in open, asymmetrical positions. Spend at least 30 minutes doing this one as well to give your baby a chance to move down. If you are lunging or stair or curb walking, you should lunge/walk/go up stairs in the direction that feels better to you. The key with the lunge is that the toes of the higher leg and mom's belly button should be at right angles. Do not lunge over your knee, that closes the pelvis.  You got this!!!!!!!!!!!!!!!!!!!!!!! :)  

## 2022-10-10 NOTE — Progress Notes (Signed)
S: Ms. Bri Bianchi is a 27 y.o. G3P2002 at [redacted]w[redacted]d who presents to MAU today complaining contractions since yestercay. She denies vaginal bleeding. She denies LOF. She reports normal fetal movement.    O: BP 123/80   Pulse 84   Temp 97.9 F (36.6 C) (Oral)   Resp 16   LMP  (LMP Unknown)   SpO2 100%   Cervical exam:  Dilation: 2 Effacement (%): 50, 60 Cervical Position: Middle Station: -1 Presentation: Vertex Exam by:: Alondra Twitty RN   Fetal Monitoring: Baseline: 135bpm Variability: Moderate Accelerations: Present Decelerations: Absent Contractions: 3-9 mins   A: SIUP at 354w4dFalse labor  P: -Labor/MAU return precautions  Autry-Lott, SiNaaman PlummerDO 10/10/2022 10:39 PM

## 2022-10-12 ENCOUNTER — Ambulatory Visit (INDEPENDENT_AMBULATORY_CARE_PROVIDER_SITE_OTHER): Payer: Medicaid Other | Admitting: Family Medicine

## 2022-10-12 ENCOUNTER — Encounter: Payer: Self-pay | Admitting: Family Medicine

## 2022-10-12 ENCOUNTER — Encounter: Payer: Medicaid Other | Admitting: Obstetrics and Gynecology

## 2022-10-12 VITALS — BP 128/83 | HR 89 | Wt 226.4 lb

## 2022-10-12 DIAGNOSIS — O98513 Other viral diseases complicating pregnancy, third trimester: Secondary | ICD-10-CM

## 2022-10-12 DIAGNOSIS — Z3A38 38 weeks gestation of pregnancy: Secondary | ICD-10-CM

## 2022-10-12 DIAGNOSIS — Z3480 Encounter for supervision of other normal pregnancy, unspecified trimester: Secondary | ICD-10-CM

## 2022-10-12 DIAGNOSIS — B009 Herpesviral infection, unspecified: Secondary | ICD-10-CM

## 2022-10-12 DIAGNOSIS — Z98891 History of uterine scar from previous surgery: Secondary | ICD-10-CM

## 2022-10-12 NOTE — Progress Notes (Signed)
Pt presents for ROB visit. No concerns at this time.

## 2022-10-13 ENCOUNTER — Inpatient Hospital Stay (HOSPITAL_COMMUNITY)
Admission: AD | Admit: 2022-10-13 | Discharge: 2022-10-13 | Disposition: A | Discharge status: Home / Self Care | Payer: Medicaid Other | Attending: Obstetrics and Gynecology | Admitting: Obstetrics and Gynecology

## 2022-10-13 ENCOUNTER — Encounter (HOSPITAL_COMMUNITY): Payer: Self-pay | Admitting: Obstetrics and Gynecology

## 2022-10-13 DIAGNOSIS — O479 False labor, unspecified: Secondary | ICD-10-CM

## 2022-10-13 DIAGNOSIS — Z3A38 38 weeks gestation of pregnancy: Secondary | ICD-10-CM | POA: Diagnosis not present

## 2022-10-13 DIAGNOSIS — O26893 Other specified pregnancy related conditions, third trimester: Secondary | ICD-10-CM | POA: Diagnosis not present

## 2022-10-13 DIAGNOSIS — R102 Pelvic and perineal pain: Secondary | ICD-10-CM | POA: Insufficient documentation

## 2022-10-13 DIAGNOSIS — Z3480 Encounter for supervision of other normal pregnancy, unspecified trimester: Secondary | ICD-10-CM

## 2022-10-13 NOTE — MAU Note (Signed)
...  Phyllis Kidd is a 27 y.o. at 45w0dhere in MAU reporting: Constant middle to lower abdominal pain accompanied by constant anterior pelvic pain that began around 1500. She reports the pain is sharp and increases with position changes and walking. She reports the pain does not cease and it does not come and go as contractions do. Denies VB or LOF. Denies recent IC. SVE in office yesterday. Patient 2.570-1. She reports she was told to come to the hospital if she experiences any pain. Has not taken anything for the pain.  Onset of complaint: 1500 today Pain score:  10/10 middle to lower abdomen 10/10 pelvis  FHT: 139 initial external

## 2022-10-13 NOTE — MAU Note (Signed)
Pitcher of water given. Urine dark amber. DO aware.

## 2022-10-13 NOTE — Progress Notes (Signed)
   PRENATAL VISIT NOTE  Subjective:  Phyllis Kidd is a 27 y.o. G3P2002 at 57w0dbeing seen today for ongoing prenatal care.  She is currently monitored for the following issues for this low-risk pregnancy and has History of C-section; GERD (gastroesophageal reflux disease); Acute stress reaction; Supervision of other normal pregnancy, antepartum; History of ELISA positive for HSV; History of VBAC; Bilateral carpal tunnel syndrome; and Encounter for suspected PROM, with rupture of membranes not found on their problem list.  Patient reports no cramping, no leaking, and occasional contractions.  Contractions: Irritability. Vag. Bleeding: None.  Movement: Present. Denies leaking of fluid.   The following portions of the patient's history were reviewed and updated as appropriate: allergies, current medications, past family history, past medical history, past social history, past surgical history and problem list.   Objective:   Vitals:   10/12/22 1502  BP: 128/83  Pulse: 89  Weight: 226 lb 6.4 oz (102.7 kg)    Fetal Status: Fetal Heart Rate (bpm): 140   Movement: Present     General:  Alert, oriented and cooperative. Patient is in no acute distress.  Skin: Skin is warm and dry. No rash noted.   Cardiovascular: Normal heart rate noted  Respiratory: Normal respiratory effort, no problems with respiration noted  Abdomen: Soft, gravid, appropriate for gestational age.  Pain/Pressure: Present     Pelvic: Cervical exam performed in the presence of a chaperone     2.5/70/-1   Extremities: Normal range of motion.  Edema: Mild pitting, slight indentation  Mental Status: Normal mood and affect. Normal behavior. Normal judgment and thought content.   Assessment and Plan:  Pregnancy: G3P2002 at 352w0d. Supervision of other normal pregnancy, antepartum Continue routine prenatal care.  Next appointment scheduled.  2. History of VBAC Planning TOLAC. Papers previously signed   3. Herpes simplex  virus type 2 (HSV-2) infection affecting pregnancy in third trimester On suppression.   Term labor symptoms and general obstetric precautions including but not limited to vaginal bleeding, contractions, leaking of fluid and fetal movement were reviewed in detail with the patient. Please refer to After Visit Summary for other counseling recommendations.   No follow-ups on file.  Future Appointments  Date Time Provider De[place]earne3/13/2024 10:35 AM ArWoodroe ModeMD [facility]  10/26/2022  9:55 AM DaLaury DeepCNM [facility]  11/02/2022 10:35 AM ErChancy MilroyMD [facility] None    JoConcepcion LivingMD

## 2022-10-13 NOTE — MAU Provider Note (Addendum)
Ms. Lindalee Boatman is a [id number] at 43w0dseen in MAU for labor. RN labor check, not seen by provider. Reports abdominal pain, worse with movement. No cervical change on serial exam. Found to be contracting q4-166mn arrival, spaced to irregular contractions following oral hydration. SVE by RN  Dilation: 2.5 Effacement (%): 70 Cervical Position: Middle Station: -2 Presentation: Vertex Exam by:: ViCloretta NedRN   NST - FHR: 130 bpm / moderate variability / accels present / decels absent NST reactive / TOCO: irregular  Plan: D/C home with labor precautions Keep scheduled appt with Dr. ArRoselie Awkwardn 3/13  LyDarlen RoundMD PGY-1 10/13/2022 6:42 PM     Fellow Attestation  I reviewed the patient's history, physical exam, and medical decision making activities of this service and have verified that the service and findings are accurately documented in the resident's note. I developed the management plan that is described in the resident's note, and I agree with the content, with my edits above.   ChStormy CardMD,[facility] OB Fellow, Faculty Practice  10/13/2022 7:29 PM

## 2022-10-17 ENCOUNTER — Encounter: Payer: Self-pay | Admitting: Obstetrics & Gynecology

## 2022-10-17 ENCOUNTER — Ambulatory Visit (INDEPENDENT_AMBULATORY_CARE_PROVIDER_SITE_OTHER): Payer: Medicaid Other | Admitting: Obstetrics & Gynecology

## 2022-10-17 VITALS — BP 128/83 | HR 70 | Wt 231.0 lb

## 2022-10-17 DIAGNOSIS — Z0371 Encounter for suspected problem with amniotic cavity and membrane ruled out: Secondary | ICD-10-CM

## 2022-10-17 DIAGNOSIS — Z3A38 38 weeks gestation of pregnancy: Secondary | ICD-10-CM

## 2022-10-17 DIAGNOSIS — Z3480 Encounter for supervision of other normal pregnancy, unspecified trimester: Secondary | ICD-10-CM

## 2022-10-17 DIAGNOSIS — Z98891 History of uterine scar from previous surgery: Secondary | ICD-10-CM

## 2022-10-17 DIAGNOSIS — Z3483 Encounter for supervision of other normal pregnancy, third trimester: Secondary | ICD-10-CM

## 2022-10-17 NOTE — Progress Notes (Signed)
   PRENATAL VISIT NOTE  Subjective:  Phyllis Kidd is a 27 y.o. G3P2002 at [redacted]w[redacted]d being seen today for ongoing prenatal care.  She is currently monitored for the following issues for this high-risk pregnancy and has History of C-section; GERD (gastroesophageal reflux disease); Acute stress reaction; Supervision of other normal pregnancy, antepartum; History of ELISA positive for HSV; History of VBAC; Bilateral carpal tunnel syndrome; and Encounter for suspected PROM, with rupture of membranes not found on their problem list.  Patient reports occasional contractions.  Contractions: Irregular. Vag. Bleeding: None.  Movement: Present. Denies leaking of fluid.   The following portions of the patient's history were reviewed and updated as appropriate: allergies, current medications, past family history, past medical history, past social history, past surgical history and problem list.   Objective:   Vitals:   10/17/22 1509  BP: 128/83  Pulse: 70  Weight: 104.8 kg    Fetal Status: Fetal Heart Rate (bpm): 140   Movement: Present     General:  Alert, oriented and cooperative. Patient is in no acute distress.  Skin: Skin is warm and dry. No rash noted.   Cardiovascular: Normal heart rate noted  Respiratory: Normal respiratory effort, no problems with respiration noted  Abdomen: Soft, gravid, appropriate for gestational age.  Pain/Pressure: Present     Pelvic: Cervical exam performed in the presence of a chaperone Dilation: 2.5 Effacement (%): 50 Station: -3  Extremities: Normal range of motion.  Edema: Deep pitting, indentation remains for a short time  Mental Status: Normal mood and affect. Normal behavior. Normal judgment and thought content.   Assessment and Plan:  Pregnancy: G3P2002 at [redacted]w[redacted]d 1. Supervision of other normal pregnancy, antepartum   2. History of VBAC Successful TOLAC  3. History of C-section G1  4. Encounter for suspected PROM, with rupture of membranes not  found No evidence for ROM  Term labor symptoms and general obstetric precautions including but not limited to vaginal bleeding, contractions, leaking of fluid and fetal movement were reviewed in detail with the patient. Please refer to After Visit Summary for other counseling recommendations.   Return in about 1 week (around 10/24/2022).  Future Appointments  Date Time Provider [place]  10/26/2022  9:55 AM Laury Deep, CNM [facility] None  11/02/2022 10:35 AM Chancy Milroy, MD [facility] None    Emeterio Reeve, MD

## 2022-10-17 NOTE — Progress Notes (Signed)
Pt presents for ROB requests cx check today.

## 2022-10-18 ENCOUNTER — Telehealth: Payer: Self-pay | Admitting: *Deleted

## 2022-10-18 NOTE — Telephone Encounter (Signed)
TC from pt reporting "lost mucous plug" this am and was having UC's Q 3-5. UC's now spaced out and did not get stronger. No LOF at present and reports good FM. Advised pt OK to stay home for now. Advised to head in to MAU if UC's return and are at least 5 min apart and consistent, or for LOF, or for decreased FM. Discussed signs of ROM. Pt verbalized understanding. Reassurance offered.

## 2022-10-19 ENCOUNTER — Encounter: Payer: Medicaid Other | Admitting: Obstetrics & Gynecology

## 2022-10-24 ENCOUNTER — Inpatient Hospital Stay (HOSPITAL_BASED_OUTPATIENT_CLINIC_OR_DEPARTMENT_OTHER): Payer: Medicaid Other

## 2022-10-24 ENCOUNTER — Encounter (HOSPITAL_COMMUNITY): Payer: Self-pay | Admitting: Obstetrics & Gynecology

## 2022-10-24 ENCOUNTER — Inpatient Hospital Stay (EMERGENCY_DEPARTMENT_HOSPITAL)
Admission: AD | Admit: 2022-10-24 | Discharge: 2022-10-24 | Disposition: A | Discharge status: Home / Self Care | Payer: Medicaid Other | Source: Home / Self Care | Attending: Obstetrics & Gynecology | Admitting: Obstetrics & Gynecology

## 2022-10-24 DIAGNOSIS — O99891 Other specified diseases and conditions complicating pregnancy: Secondary | ICD-10-CM | POA: Diagnosis not present

## 2022-10-24 DIAGNOSIS — Z3A39 39 weeks gestation of pregnancy: Secondary | ICD-10-CM

## 2022-10-24 DIAGNOSIS — O34219 Maternal care for unspecified type scar from previous cesarean delivery: Secondary | ICD-10-CM | POA: Diagnosis not present

## 2022-10-24 DIAGNOSIS — R109 Unspecified abdominal pain: Secondary | ICD-10-CM | POA: Diagnosis not present

## 2022-10-24 DIAGNOSIS — O26893 Other specified pregnancy related conditions, third trimester: Secondary | ICD-10-CM | POA: Insufficient documentation

## 2022-10-24 DIAGNOSIS — O99213 Obesity complicating pregnancy, third trimester: Secondary | ICD-10-CM

## 2022-10-24 DIAGNOSIS — E669 Obesity, unspecified: Secondary | ICD-10-CM | POA: Diagnosis not present

## 2022-10-24 DIAGNOSIS — R102 Pelvic and perineal pain: Secondary | ICD-10-CM | POA: Insufficient documentation

## 2022-10-24 DIAGNOSIS — Z3480 Encounter for supervision of other normal pregnancy, unspecified trimester: Secondary | ICD-10-CM
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
## 2022-10-24 NOTE — MAU Note (Signed)
Phyllis Kidd is a 27 y.o. at [redacted]w[redacted]d here in MAU reporting: for a couple of days has been having really bad pain in the bottom of her stomach, feels like a pulling sensation, doesn't go away.  They mix with the contractions and it is really uncomfortable. Contractions are now every 3-5 min. No bleeding or leaking. Reports +FM/ Also swelling in legs isn't going away, even when she has them elevated, they are hurting. Onset of complaint: couple of days Pain score: constant 10, contractions 9 Vitals:   10/24/22 1549  BP: 131/81  Pulse: 79  Resp: 16  Temp: 98.7 F (37.1 C)  SpO2: 100%     [id number] dress on, reports +FM Lab orders placed from triage:

## 2022-10-25 ENCOUNTER — Encounter (HOSPITAL_COMMUNITY): Payer: Self-pay | Admitting: Family Medicine

## 2022-10-25 ENCOUNTER — Ambulatory Visit (INDEPENDENT_AMBULATORY_CARE_PROVIDER_SITE_OTHER): Payer: Medicaid Other

## 2022-10-25 ENCOUNTER — Inpatient Hospital Stay (HOSPITAL_COMMUNITY)
Admission: AD | Admit: 2022-10-25 | Discharge: 2022-10-28 | DRG: 806 | Disposition: A | Discharge status: Home / Self Care | Payer: Medicaid Other | Attending: Obstetrics & Gynecology | Admitting: Obstetrics & Gynecology

## 2022-10-25 ENCOUNTER — Inpatient Hospital Stay (HOSPITAL_BASED_OUTPATIENT_CLINIC_OR_DEPARTMENT_OTHER): Payer: Medicaid Other

## 2022-10-25 ENCOUNTER — Other Ambulatory Visit: Payer: Self-pay

## 2022-10-25 VITALS — BP 128/88 | HR 76 | Wt 230.0 lb

## 2022-10-25 DIAGNOSIS — O368130 Decreased fetal movements, third trimester, not applicable or unspecified: Secondary | ICD-10-CM | POA: Diagnosis not present

## 2022-10-25 DIAGNOSIS — O9832 Other infections with a predominantly sexual mode of transmission complicating childbirth: Secondary | ICD-10-CM | POA: Diagnosis present

## 2022-10-25 DIAGNOSIS — K219 Gastro-esophageal reflux disease without esophagitis: Secondary | ICD-10-CM | POA: Diagnosis present

## 2022-10-25 DIAGNOSIS — Z3480 Encounter for supervision of other normal pregnancy, unspecified trimester: Secondary | ICD-10-CM

## 2022-10-25 DIAGNOSIS — A6000 Herpesviral infection of urogenital system, unspecified: Secondary | ICD-10-CM | POA: Diagnosis present

## 2022-10-25 DIAGNOSIS — Z8619 Personal history of other infectious and parasitic diseases: Secondary | ICD-10-CM | POA: Diagnosis present

## 2022-10-25 DIAGNOSIS — O99820 Streptococcus B carrier state complicating pregnancy: Secondary | ICD-10-CM | POA: Diagnosis not present

## 2022-10-25 DIAGNOSIS — Z3A39 39 weeks gestation of pregnancy: Secondary | ICD-10-CM | POA: Diagnosis not present

## 2022-10-25 DIAGNOSIS — O34219 Maternal care for unspecified type scar from previous cesarean delivery: Secondary | ICD-10-CM | POA: Diagnosis present

## 2022-10-25 DIAGNOSIS — O99824 Streptococcus B carrier state complicating childbirth: Secondary | ICD-10-CM | POA: Diagnosis not present

## 2022-10-25 DIAGNOSIS — O368190 Decreased fetal movements, unspecified trimester, not applicable or unspecified: Secondary | ICD-10-CM | POA: Diagnosis present

## 2022-10-25 DIAGNOSIS — O26893 Other specified pregnancy related conditions, third trimester: Secondary | ICD-10-CM | POA: Diagnosis not present

## 2022-10-25 DIAGNOSIS — Z0371 Encounter for suspected problem with amniotic cavity and membrane ruled out: Secondary | ICD-10-CM | POA: Diagnosis present

## 2022-10-25 DIAGNOSIS — O34211 Maternal care for low transverse scar from previous cesarean delivery: Secondary | ICD-10-CM | POA: Diagnosis not present

## 2022-10-25 DIAGNOSIS — R102 Pelvic and perineal pain: Secondary | ICD-10-CM | POA: Diagnosis not present

## 2022-10-25 DIAGNOSIS — Z98891 History of uterine scar from previous surgery: Secondary | ICD-10-CM

## 2022-10-25 LAB — URINALYSIS, ROUTINE W REFLEX MICROSCOPIC
Bacteria, UA: NONE SEEN
Glucose, UA: NEGATIVE mg/dL
Hgb urine dipstick: NEGATIVE
Ketones, ur: 20 mg/dL — AB
Leukocytes,Ua: NEGATIVE
Nitrite: NEGATIVE
Protein, ur: 30 mg/dL — AB
Specific Gravity, Urine: 1.03 (ref 1.005–1.030)
pH: 5 (ref 5.0–8.0)

## 2022-10-25 LAB — CBC
HCT: 36.4 % (ref 36.0–46.0)
Hemoglobin: 12.1 g/dL (ref 12.0–15.0)
MCH: 29.6 pg (ref 26.0–34.0)
MCHC: 33.2 g/dL (ref 30.0–36.0)
MCV: 89 fL (ref 80.0–100.0)
Platelets: 297 10*3/uL (ref 150–400)
RBC: 4.09 MIL/uL (ref 3.87–5.11)
RDW: 14.3 % (ref 11.5–15.5)
WBC: 6.2 10*3/uL (ref 4.0–10.5)
nRBC: 0 % (ref 0.0–0.2)

## 2022-10-25 LAB — RAPID HIV SCREEN (HIV 1/2 AB+AG)
HIV 1/2 Antibodies: NONREACTIVE
HIV-1 P24 Antigen - HIV24: NONREACTIVE

## 2022-10-25 LAB — TYPE AND SCREEN
ABO/RH(D): B POS
Antibody Screen: NEGATIVE

## 2022-10-25 MED ORDER — DIPHENHYDRAMINE HCL 50 MG/ML IJ SOLN: 12.5000 mg | INTRAMUSCULAR | Status: DC | PRN

## 2022-10-25 MED ORDER — PENICILLIN G POT IN DEXTROSE 60000 UNIT/ML IV SOLN
3.0000 10*6.[IU] | INTRAVENOUS | Status: DC
  Administered 2022-10-26: 3 10*6.[IU] via INTRAVENOUS
  Filled 2022-10-25 (×3): qty 50

## 2022-10-25 MED ORDER — LACTATED RINGERS IV SOLN: INTRAVENOUS | Status: DC

## 2022-10-25 MED ORDER — ONDANSETRON HCL 4 MG/2ML IJ SOLN
4.0000 mg | Freq: Four times a day (QID) | INTRAMUSCULAR | Status: DC | PRN
  Administered 2022-10-26: 4 mg via INTRAVENOUS
  Filled 2022-10-25: qty 2

## 2022-10-25 MED ORDER — PHENYLEPHRINE 80 MCG/ML (10ML) SYRINGE FOR IV PUSH (FOR BLOOD PRESSURE SUPPORT): 80.0000 ug | PREFILLED_SYRINGE | INTRAVENOUS | Status: DC | PRN

## 2022-10-25 MED ORDER — OXYTOCIN-SODIUM CHLORIDE 30-0.9 UT/500ML-% IV SOLN: 1.0000 m[IU]/min | INTRAVENOUS | Status: DC

## 2022-10-25 MED ORDER — SODIUM CHLORIDE 0.9 % IV SOLN
5.0000 10*6.[IU] | Freq: Once | INTRAVENOUS | Status: AC
  Administered 2022-10-25: 5 10*6.[IU] via INTRAVENOUS
  Filled 2022-10-25: qty 5

## 2022-10-25 MED ORDER — OXYTOCIN BOLUS FROM INFUSION
333.0000 mL | Freq: Once | INTRAVENOUS | Status: AC
  Administered 2022-10-26: 333 mL via INTRAVENOUS

## 2022-10-25 MED ORDER — OXYCODONE-ACETAMINOPHEN 5-325 MG PO TABS: 1.0000 | ORAL_TABLET | ORAL | Status: DC | PRN

## 2022-10-25 MED ORDER — ACETAMINOPHEN 325 MG PO TABS: 650.0000 mg | ORAL_TABLET | ORAL | Status: DC | PRN

## 2022-10-25 MED ORDER — LACTATED RINGERS IV SOLN
500.0000 mL | Freq: Once | INTRAVENOUS | Status: AC
  Administered 2022-10-26: 500 mL via INTRAVENOUS

## 2022-10-25 MED ORDER — SOD CITRATE-CITRIC ACID 500-334 MG/5ML PO SOLN: 30.0000 mL | ORAL | Status: DC | PRN

## 2022-10-25 MED ORDER — TERBUTALINE SULFATE 1 MG/ML IJ SOLN: 0.2500 mg | Freq: Once | INTRAMUSCULAR | Status: DC | PRN

## 2022-10-25 MED ORDER — OXYCODONE-ACETAMINOPHEN 5-325 MG PO TABS: 2.0000 | ORAL_TABLET | ORAL | Status: DC | PRN

## 2022-10-25 MED ORDER — FENTANYL-BUPIVACAINE-NACL 0.5-0.125-0.9 MG/250ML-% EP SOLN
12.0000 mL/h | EPIDURAL | Status: DC | PRN
  Filled 2022-10-25: qty 250

## 2022-10-25 MED ORDER — EPHEDRINE 5 MG/ML INJ: 10.0000 mg | INTRAVENOUS | Status: DC | PRN

## 2022-10-25 MED ORDER — LIDOCAINE HCL (PF) 1 % IJ SOLN: 30.0000 mL | INTRAMUSCULAR | Status: DC | PRN

## 2022-10-25 MED ORDER — OXYTOCIN-SODIUM CHLORIDE 30-0.9 UT/500ML-% IV SOLN
1.0000 m[IU]/min | INTRAVENOUS | Status: DC
  Administered 2022-10-25: 2 m[IU]/min via INTRAVENOUS

## 2022-10-25 MED ORDER — LACTATED RINGERS IV SOLN: 500.0000 mL | INTRAVENOUS | Status: DC | PRN

## 2022-10-25 MED ORDER — OXYTOCIN-SODIUM CHLORIDE 30-0.9 UT/500ML-% IV SOLN
2.5000 [IU]/h | INTRAVENOUS | Status: DC
  Filled 2022-10-25: qty 500

## 2022-10-25 NOTE — Progress Notes (Signed)
   LOW-RISK PREGNANCY OFFICE VISIT  Patient name: Phyllis Kidd MRN [id number]  Date of birth: [birth year] Chief Complaint:   Routine Prenatal Visit  Subjective:   Phyllis Kidd is a 27 y.o. G15P2002 female at [redacted]w[redacted]d with an Estimated Date of Delivery: 10/27/22 being seen today for ongoing management of a low-risk pregnancy aeb has History of C-section; GERD (gastroesophageal reflux disease); Acute stress reaction; Supervision of other normal pregnancy, antepartum; History of ELISA positive for HSV; History of VBAC; Bilateral carpal tunnel syndrome; and Encounter for suspected PROM, with rupture of membranes not found on their problem list.  Patient presents today, alone, with  c/o swelling .  Patient reports fetal movement, but is unsure if it is decreased or not. Patient reports abdominal cramping and contractions that occur every 3-4 or 3-5 minutes.  Patient denies vaginal concerns including abnormal discharge, leaking of fluid, and bleeding. No issues with urination, constipation, or diarrhea.    Contractions: Irregular. Vag. Bleeding: None.  Movement: (!) Decreased.  Reviewed past medical,surgical, social, obstetrical and family history as well as problem list, medications and allergies.  Objective   Vitals:   10/25/22 1617 10/25/22 1621  BP: (!) 140/91 128/88  Pulse: 77 76  Weight: 230 lb (104.3 kg)   Body mass index is 40.74 kg/m.  Total Weight Gain:25 lb (11.3 kg)         Physical Examination:   General appearance: Well appearing, and in no distress  Mental status: Alert, oriented to person, place, and time  Skin: Warm & dry  Cardiovascular: Normal heart rate noted  Respiratory: Normal respiratory effort, no distress  Abdomen: Soft, gravid, nontender, AGA with Fundal Height: 39 cm  Pelvic: Cervical exam deferred           Extremities: Edema: Deep pitting, indentation remains for a short time  Fetal Status: Fetal Heart Rate (bpm): 143  Movement: (!) Decreased   No  results found for this or any previous visit (from the past 24 hour(s)).  Assessment & Plan:  Low-risk pregnancy of a 27 y.o., [id number] at [redacted]w[redacted]d with an Estimated Date of Delivery: 10/27/22   1. Supervision of other normal pregnancy, antepartum -Anticipatory guidance for upcoming appts. -Expecting female infant for circumcision. -Desires Depo Provera for PP BCM -Plans to pump and bottle feed.   2. [redacted] weeks gestation of pregnancy -Doing well overall. -Reviewed complaints. -Cautioned that edema will get worse after delivery.   3. Decreased fetal movement affecting management of pregnancy in third trimester, single or unspecified fetus -Discussed perception of DFM. -Patient unable to identify whether decreased or not, but does state movement felt when doppler placed. -Discussed going home and getting overnight bag then going to MAU for evaluation with understanding that she may be induced tonight. -Patient agreeable with plan. -Informed that if not admitted, will plan for IOL at 41 weeks. -MAU staff notified of patient anticipated arrival.      Meds: No orders of the defined types were placed in this encounter.  Labs/procedures today:  Lab Orders  No laboratory test(s) ordered today     Reviewed: Term labor symptoms and general obstetric precautions including but not limited to vaginal bleeding, contractions, leaking of fluid and fetal movement were reviewed in detail with the patient.  All questions were answered.  Follow-up: Return in about 1 week (around 11/01/2022) for LROB with NST.  No orders of the defined types were placed in this encounter.  Maryann Conners MSN, CNM 10/25/2022

## 2022-10-25 NOTE — Progress Notes (Signed)
Pt presents for ROB visit. Pt has increased swelling in the feet. Pt seen at MAU yesterday.

## 2022-10-25 NOTE — Anesthesia Preprocedure Evaluation (Signed)
Anesthesia Evaluation  Patient identified by MRN, date of birth, ID band Patient awake    Reviewed: Allergy & Precautions, NPO status , Patient's Chart, lab work & pertinent test results  Airway Mallampati: II  TM Distance: >3 FB Neck ROM: Full    Dental no notable dental hx. (+) Teeth Intact, Dental Advisory Given   Pulmonary neg pulmonary ROS   Pulmonary exam normal breath sounds clear to auscultation Wheezing: BMI 41.04.      Cardiovascular Normal cardiovascular exam Rhythm:Regular Rate:Normal     Neuro/Psych   Anxiety      Neuromuscular disease    GI/Hepatic Neg liver ROS,GERD  ,,  Endo/Other    Renal/GU negative Renal ROS     Musculoskeletal   Abdominal  (+) + obese  Peds  Hematology   Anesthesia Other Findings   Reproductive/Obstetrics (+) Pregnancy                             Anesthesia Physical Anesthesia Plan  ASA: 3  Anesthesia Plan: Epidural   Post-op Pain Management:    Induction:   PONV Risk Score and Plan:   Airway Management Planned:   Additional Equipment:   Intra-op Plan:   Post-operative Plan:   Informed Consent: I have reviewed the patients History and Physical, chart, labs and discussed the procedure including the risks, benefits and alternatives for the proposed anesthesia with the patient or authorized representative who has indicated his/her understanding and acceptance.       Plan Discussed with:   Anesthesia Plan Comments: (39.5 wk G3P2 aditted today fro office w elevated pressures for TLOC w LEA)       Anesthesia Quick Evaluation
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
## 2022-10-25 NOTE — MAU Note (Signed)
Phyllis Kidd is a 27 y.o. at [redacted]w[redacted]d here in MAU reporting: she was sent from [facility] office for BP evaluation and c/o decreased FM.  Pt also reports swelling bilateral feet and ankles.  Denies Ha, visual disturbances and epigastric pain. Endorses +FM.  Denies LOF & VB.  LMP: NA Onset of complaint: today Pain score: 0 Vitals:   10/25/22 1833  BP: 139/85  Pulse: 86  Resp: 18  Temp: 98.4 F (36.9 C)  SpO2: 100%     FHT:141 bpm Lab orders placed from triage:   UA

## 2022-10-26 ENCOUNTER — Encounter: Payer: Medicaid Other | Admitting: Obstetrics and Gynecology

## 2022-10-26 ENCOUNTER — Inpatient Hospital Stay (HOSPITAL_COMMUNITY): Payer: Medicaid Other | Admitting: Anesthesiology

## 2022-10-26 ENCOUNTER — Encounter (HOSPITAL_COMMUNITY): Payer: Self-pay | Admitting: Family Medicine

## 2022-10-26 DIAGNOSIS — O9832 Other infections with a predominantly sexual mode of transmission complicating childbirth: Secondary | ICD-10-CM | POA: Diagnosis not present

## 2022-10-26 DIAGNOSIS — O368130 Decreased fetal movements, third trimester, not applicable or unspecified: Secondary | ICD-10-CM | POA: Diagnosis not present

## 2022-10-26 DIAGNOSIS — O34211 Maternal care for low transverse scar from previous cesarean delivery: Secondary | ICD-10-CM | POA: Diagnosis not present

## 2022-10-26 DIAGNOSIS — O99820 Streptococcus B carrier state complicating pregnancy: Secondary | ICD-10-CM | POA: Diagnosis not present

## 2022-10-26 DIAGNOSIS — O34219 Maternal care for unspecified type scar from previous cesarean delivery: Secondary | ICD-10-CM | POA: Insufficient documentation

## 2022-10-26 DIAGNOSIS — Z3A39 39 weeks gestation of pregnancy: Secondary | ICD-10-CM | POA: Diagnosis not present

## 2022-10-26 DIAGNOSIS — E669 Obesity, unspecified: Secondary | ICD-10-CM | POA: Diagnosis not present

## 2022-10-26 DIAGNOSIS — O99214 Obesity complicating childbirth: Secondary | ICD-10-CM | POA: Diagnosis not present

## 2022-10-26 LAB — RPR: RPR Ser Ql: NONREACTIVE

## 2022-10-26 MED ORDER — ONDANSETRON HCL 4 MG PO TABS: 4.0000 mg | ORAL_TABLET | ORAL | Status: DC | PRN

## 2022-10-26 MED ORDER — FENTANYL-BUPIVACAINE-NACL 0.5-0.125-0.9 MG/250ML-% EP SOLN
EPIDURAL | Status: DC | PRN
  Administered 2022-10-26: 12 mL/h via EPIDURAL

## 2022-10-26 MED ORDER — ACETAMINOPHEN 325 MG PO TABS
650.0000 mg | ORAL_TABLET | ORAL | Status: DC | PRN
  Administered 2022-10-26 – 2022-10-27 (×2): 650 mg via ORAL
  Filled 2022-10-26 (×2): qty 2

## 2022-10-26 MED ORDER — BENZOCAINE-MENTHOL 20-0.5 % EX AERO
1.0000 | INHALATION_SPRAY | CUTANEOUS | Status: DC | PRN
  Filled 2022-10-26: qty 56

## 2022-10-26 MED ORDER — COCONUT OIL OIL: 1.0000 | TOPICAL_OIL | Status: DC | PRN

## 2022-10-26 MED ORDER — WITCH HAZEL-GLYCERIN EX PADS: 1.0000 | MEDICATED_PAD | CUTANEOUS | Status: DC | PRN

## 2022-10-26 MED ORDER — ONDANSETRON HCL 4 MG/2ML IJ SOLN: 4.0000 mg | INTRAMUSCULAR | Status: DC | PRN

## 2022-10-26 MED ORDER — TETANUS-DIPHTH-ACELL PERTUSSIS 5-2.5-18.5 LF-MCG/0.5 IM SUSY: 0.5000 mL | PREFILLED_SYRINGE | Freq: Once | INTRAMUSCULAR | Status: DC

## 2022-10-26 MED ORDER — IBUPROFEN 600 MG PO TABS
600.0000 mg | ORAL_TABLET | Freq: Four times a day (QID) | ORAL | Status: DC
  Administered 2022-10-26 – 2022-10-28 (×7): 600 mg via ORAL
  Filled 2022-10-26 (×7): qty 1

## 2022-10-26 MED ORDER — ZOLPIDEM TARTRATE 5 MG PO TABS: 5.0000 mg | ORAL_TABLET | Freq: Every evening | ORAL | Status: DC | PRN

## 2022-10-26 MED ORDER — LIDOCAINE HCL (PF) 1 % IJ SOLN
INTRAMUSCULAR | Status: DC | PRN
  Administered 2022-10-26: 5 mL via EPIDURAL

## 2022-10-26 MED ORDER — SENNOSIDES-DOCUSATE SODIUM 8.6-50 MG PO TABS
2.0000 | ORAL_TABLET | ORAL | Status: DC
  Administered 2022-10-26 – 2022-10-28 (×4): 2 via ORAL
  Filled 2022-10-26 (×4): qty 2

## 2022-10-26 MED ORDER — PRENATAL MULTIVITAMIN CH
1.0000 | ORAL_TABLET | Freq: Every day | ORAL | Status: DC
  Administered 2022-10-26 – 2022-10-27 (×2): 1 via ORAL
  Filled 2022-10-26 (×3): qty 1

## 2022-10-26 MED ORDER — DIPHENHYDRAMINE HCL 25 MG PO CAPS: 25.0000 mg | ORAL_CAPSULE | Freq: Four times a day (QID) | ORAL | Status: DC | PRN

## 2022-10-26 MED ORDER — DIBUCAINE (PERIANAL) 1 % EX OINT: 1.0000 | TOPICAL_OINTMENT | CUTANEOUS | Status: DC | PRN

## 2022-10-26 MED ORDER — SIMETHICONE 80 MG PO CHEW: 80.0000 mg | CHEWABLE_TABLET | ORAL | Status: DC | PRN

## 2022-10-26 NOTE — Discharge Summary (Signed)
Postpartum Discharge Summary    Patient Name: Phyllis Kidd DOB: Dec 27, 1995 MRN: [id number]  Date of admission: 10/25/2022 Delivery date:10/26/2022  Delivering provider: Concepcion Living  Date of discharge: 10/28/2022  Admitting diagnosis: Decreased fetal movement [O36.8190] Intrauterine pregnancy: 102w6d     Secondary diagnosis:  Principal Problem:   Decreased fetal movement Active Problems:   History of C-section   GERD (gastroesophageal reflux disease)   History of ELISA positive for HSV   History of VBAC   Encounter for suspected PROM, with rupture of membranes not found   VBAC, delivered  Additional problems: None    Discharge diagnosis: Term Pregnancy Delivered and VBAC                                              Post partum procedures: None Augmentation: AROM and Pitocin Complications: None  Hospital course: Induction of Labor With Vaginal Delivery   27 y.o. yo [id number] at [redacted]w[redacted]d was admitted to the hospital 10/25/2022 for induction of labor.  Indication for induction:  DFM .  Patient had an uncomplicated labor course. Membrane Rupture Time/Date: 3:43 AM ,10/26/2022   Delivery Method:Vaginal, Spontaneous  Episiotomy: None  Lacerations:  Labial  Details of delivery can be found in separate delivery note.  Patient had an uncomplicated postpartum course. Patient is discharged home 10/28/22.  Newborn Data: Birth date:10/26/2022  Birth time:7:10 AM  Gender:Female  Living status:Living  Apgars:7 ,9  Weight:3610 g   Magnesium Sulfate received: No BMZ received: No Rhophylac:No MMR:No  Physical exam  Vitals:   10/27/22 1139 10/27/22 1945 10/28/22 0436 10/28/22 0749  BP: 122/64 133/79 122/66 (!) 116/57  Pulse: 61 62 (!) 52 (!) 56  Resp: 18 18 18 18   Temp: 97.8 F (36.6 C) 97.6 F (36.4 C) 98 F (36.7 C) 98 F (36.7 C)  TempSrc: Oral Oral Oral Oral  SpO2: 100% 99% 100%   Weight:      Height:       General: alert, cooperative, and no distress Lochia:  appropriate Uterine Fundus: firm Incision: N/A DVT Evaluation: No evidence of DVT seen on physical exam. Negative Homan's sign. No cords or calf tenderness. Labs: Lab Results  Component Value Date   WBC 6.2 10/25/2022   HGB 12.1 10/25/2022   HCT 36.4 10/25/2022   MCV 89.0 10/25/2022   PLT 297 10/25/2022      Latest Ref Rng & Units 03/05/2021    2:48 PM  CMP  Glucose 70 - 99 mg/dL 102   BUN 6 - 20 mg/dL 7   Creatinine 0.44 - 1.00 mg/dL 0.80   Sodium 135 - 145 mmol/L 140   Potassium 3.5 - 5.1 mmol/L 3.8   Chloride 98 - 111 mmol/L 105    Edinburgh Score:    10/27/2022    8:08 AM  Edinburgh Postnatal Depression Scale Screening Tool  I have been able to laugh and see the funny side of things. 0  I have looked forward with enjoyment to things. 1  I have blamed myself unnecessarily when things went wrong. 0  I have been anxious or worried for no good reason. 2  I have felt scared or panicky for no good reason. 0  Things have been getting on top of me. 2  I have been so unhappy that I have had difficulty sleeping. 0  I have felt sad or miserable.  1  I have been so unhappy that I have been crying. 0  The thought of harming myself has occurred to me. 0  Edinburgh Postnatal Depression Scale Total 6     After visit meds:  Allergies as of 10/28/2022   No Known Allergies      Medication List     STOP taking these medications    valACYclovir 500 MG tablet Commonly known as: VALTREX       TAKE these medications    acetaminophen 500 MG tablet Commonly known as: TYLENOL Take 2 tablets (1,000 mg total) by mouth every 6 (six) hours as needed for mild pain or moderate pain.   ibuprofen 600 MG tablet Commonly known as: ADVIL Take 1 tablet (600 mg total) by mouth every 6 (six) hours as needed for headache, mild pain or moderate pain.   oxyCODONE 5 MG immediate release tablet Commonly known as: Oxy IR/ROXICODONE Take 1 tablet (5 mg total) by mouth every 6 (six) hours as  needed for severe pain or breakthrough pain.   PrePLUS 27-1 MG Tabs Take 1 tablet by mouth daily.   senna-docusate 8.6-50 MG tablet Commonly known as: Senokot-S Take 2 tablets by mouth 2 (two) times daily as needed for mild constipation or moderate constipation.       Discharge home in stable condition Infant Feeding: Bottle and Breast Infant Disposition:home with mother Discharge instruction: per After Visit Summary and Postpartum booklet. Activity: Advance as tolerated. Pelvic rest for 6 weeks.  Diet: routine diet Future Appointments  Date Time Provider [place]  12/07/2022  1:10 PM Autry-Lott, Naaman Plummer, DO [facility] None   Follow up Visit: Message sent  Please schedule this patient for a In person postpartum visit in 6 weeks with the following provider: Any provider. Additional Postpartum F/U: None   Low risk pregnancy complicated by:  NA Delivery mode:  Vaginal, Spontaneous  Anticipated Birth Control:  Depo   10/28/2022 Verita Schneiders, MD

## 2022-10-26 NOTE — Progress Notes (Signed)
Circumcision Consent  Discussed with mom at bedside about circumcision.   Circumcision is a surgery that removes the skin that covers the tip of the penis, called the "foreskin." Circumcision is usually done when a boy is between 75 and 24 days old, sometimes up to 33-54 weeks old.  The most common reasons boys are circumcised include for cultural/religious beliefs or for parental preference (potentially easier to clean, so baby looks like daddy, etc).  There may be some medical benefits for circumcision:   Circumcised boys seem to have slightly lower rates of: ? Urinary tract infections (per the American Academy of Pediatrics an uncircumcised boy has a 1/100 chance of developing a UTI in the first year of life, a circumcised boy at a 08/998 chance of developing a UTI in the first year of life- a 10% reduction) ? Penis cancer (typically rare- an uncircumcised female has a 1 in 100,000 chance of developing cancer of the penis) ? Sexually transmitted infection (in endemic areas, including HIV, HPV and Herpes- circumcision does NOT protect against gonorrhea, chlamydia, trachomatis, or syphilis) ? Phimosis: a condition where that makes retraction of the foreskin over the glans impossible (0.4 per 1000 boys per year or 0.6% of boys are affected by their 15th birthday)  Boys and men who are not circumcised can reduce these extra risks by: ? Cleaning their penis well ? Using condoms during sex  What are the risks of circumcision?  As with any surgical procedure, there are risks and complications. In circumcision, complications are rare and usually minor, the most common being: ? Bleeding- risk is reduced by holding each clamp for 30 seconds prior to a cut being made, and by holding pressure after the procedure is done ? Infection- the penis is cleaned prior to the procedure, and the procedure is done under sterile technique ? Damage to the urethra or amputation of the penis  How is circumcision done  in baby boys?  The baby will be placed on a special table and the legs restrained for their safety. Numbing medication is injected into the penis, and the skin is cleansed with betadine to decrease the risk of infection.   What to expect:  The penis will look red and raw for 5-7 days as it heals. We expect scabbing around where the cut was made, as well as clear-pink fluid and some swelling of the penis right after the procedure. If your baby's circumcision starts to bleed or develops pus, please contact your pediatrician immediately.  All questions were answered and mother consented.  Concepcion Living, MD 7:32 AM

## 2022-10-26 NOTE — Progress Notes (Signed)
LABOR PROGRESS NOTE  Phyllis Kidd is a 27 y.o. G3P2002 at [redacted]w[redacted]d  admitted for IOL  Subjective: Feeling tired, vomiting   Objective: BP 113/73   Pulse (!) 206   Temp 98.4 F (36.9 C) (Oral)   Resp 18   Ht 5\' 3"  (1.6 m)   Wt 105.1 kg   LMP  (LMP Unknown)   SpO2 99%   BMI 41.04 kg/m  or  Vitals:   10/26/22 0231 10/26/22 0301 10/26/22 0328 10/26/22 0331  BP: 104/62 117/76 133/83 113/73  Pulse: (!) 58 74 69 (!) 206  Resp:      Temp:      TempSrc:      SpO2:      Weight:      Height:        Dilation: 4 Effacement (%): 80, 90 Cervical Position: Anterior Station: -2 Presentation: Vertex Exam by:: Topanga Alvelo MD FHT: baseline rate 120, moderate varibility, + acel, nodecel Toco: every 2-3 min  Labs: Lab Results  Component Value Date   WBC 6.2 10/25/2022   HGB 12.1 10/25/2022   HCT 36.4 10/25/2022   MCV 89.0 10/25/2022   PLT 297 10/25/2022    Patient Active Problem List   Diagnosis Date Noted   Decreased fetal movement 10/25/2022   Encounter for suspected PROM, with rupture of membranes not found 10/09/2022   History of ELISA positive for HSV 05/25/2022   History of VBAC 05/25/2022   Supervision of other normal pregnancy, antepartum 02/28/2022   Acute stress reaction 10/11/2021   Bilateral carpal tunnel syndrome 07/14/2021   GERD (gastroesophageal reflux disease) 03/30/2020   History of C-section 09/05/2017    Assessment / Plan: 27 y.o. G3P2002 at [redacted]w[redacted]d here for IOL for DFM  Labor: slow progression, on pitocin 4. AROM with clear fluid  Fetal Wellbeing:  Cat 1 Pain Control:  Epidural in place  Anticipated MOD:  vaginal   Gifford Shave, MD  OB Fellow  10/26/2022, 4:03 AM

## 2022-10-26 NOTE — Anesthesia Postprocedure Evaluation (Signed)
Anesthesia Post Note  Patient: Phyllis Kidd  Procedure(s) Performed: AN AD HOC LABOR EPIDURAL     Patient location during evaluation: Mother Baby Anesthesia Type: Epidural Level of consciousness: awake and alert Pain management: pain level controlled Vital Signs Assessment: post-procedure vital signs reviewed and stable Respiratory status: spontaneous breathing, nonlabored ventilation and respiratory function stable Cardiovascular status: stable Postop Assessment: no headache, no backache and epidural receding Anesthetic complications: no   No notable events documented.  Last Vitals:  Vitals:   10/26/22 1607 10/26/22 1818  BP: (!) 120/52 (!) 118/56  Pulse: (!) 58 60  Resp: 13 16  Temp: 36.5 C 36.7 C  SpO2: 98% 100%    Last Pain:  Vitals:   10/26/22 1818  TempSrc: Oral  PainSc:    Pain Goal:                   [name]

## 2022-10-26 NOTE — Anesthesia Procedure Notes (Signed)
Epidural Patient location during procedure: OB Start time: 10/26/2022 12:59 AM End time: 10/26/2022 1:14 AM  Staffing Anesthesiologist: Barnet Glasgow, MD Performed: anesthesiologist   Preanesthetic Checklist Completed: patient identified, IV checked, site marked, risks and benefits discussed, surgical consent, monitors and equipment checked, pre-op evaluation and timeout performed  Epidural Patient position: sitting Prep: DuraPrep and site prepped and draped Patient monitoring: continuous pulse ox and blood pressure Approach: midline Location: L3-L4 Injection technique: LOR air  Needle:  Needle type: Tuohy  Needle gauge: 17 G Needle length: 9 cm and 9 Needle insertion depth: 7 cm Catheter type: closed end flexible Catheter size: 19 Gauge Catheter at skin depth: 13 cm Test dose: negative  Assessment Events: blood not aspirated, no cerebrospinal fluid, injection not painful, no injection resistance, no paresthesia and negative IV test  Additional Notes Patient identified. Risks/Benefits/Options discussed with patient including but not limited to bleeding, infection, nerve damage, paralysis, failed block, incomplete pain control, headache, blood pressure changes, nausea, vomiting, reactions to medication both or allergic, itching and postpartum back pain. Confirmed with bedside nurse the patient's most recent platelet count. Confirmed with patient that they are not currently taking any anticoagulation, have any bleeding history or any family history of bleeding disorders. Patient expressed understanding and wished to proceed. All questions were answered. Sterile technique was used throughout the entire procedure. Please see nursing notes for vital signs. Test dose was given through epidural needle and negative prior to continuing to dose epidural or start infusion. Warning signs of high block given to the patient including shortness of breath, tingling/numbness in hands, complete  motor block, or any concerning symptoms with instructions to call for help. Patient was given instructions on fall risk and not to get out of bed. All questions and concerns addressed with instructions to call with any issues.  1 Attempt (S) . Patient tolerated procedure well.

## 2022-10-26 NOTE — Lactation Note (Signed)
This note was copied from a baby's chart. Lactation Consultation Note  Patient Name: Phyllis Kidd [id number] Date: 10/26/2022 Age:26 hours Reason for consult: Initial assessment;Exclusive pumping and bottle feeding;Term  P3, Per Birth Parent her feeding choice is " Pumping Only" and formula feeding infant. She did not BF her other children. Perr Birth Parent, she has pumped once was concern she did not see colostrum when pumping. LC explained this is normal and encouraged Birth Parent to continue to pump every 3 hours for 15 minutes on initial setting. LC re-fitted Birth Parent with a size 21 mm breast flange and Birth Parent was pumping when LC was in the room. [facility] taught had hand expression with breast model and Birth Parent expressed 1 ml of  colostrum that she will finger feed infant at the next feeding. LC gave handout "Storage and Preparation of Breast Milk".  Birth Parent understands that EBM is safe for 4 hours at room temperature whereas formula must be used within 1 hour. LC discussed maternal diet, rest and hydration. Pace feeding infant, infant was given 20 mls of 20 kcal formula at 1340 pm.  Per Birth Parent she  used  the RTF bottle twice and greater than 1 hour,  LC explained formula should be used only once for a feeding, that only good open for 1 hour opened at room temperature  , once infant has eaten from a bottle to discard the  rest of formula.  Birth Parent was made aware of O/P services, breastfeeding support groups, community resources, and our phone # for post-discharge questions.    Maternal Data Has patient been taught Hand Expression?: Yes Does the patient have breastfeeding experience prior to this delivery?: No (Per Birth Parent, she wants to pump only with her 3rd child)  Feeding Mother's Current Feeding Choice: Breast Milk and Formula Nipple Type: Slow - flow  LATCH Score   "No Latch," Birth Parent is pumping only.                  Lactation Tools  Discussed/Used Tools: Pump;Flanges Flange Size: 21 Breast pump type: Double-Electric Breast Pump Pump Education: Setup, frequency, and cleaning;Milk Storage Reason for Pumping: Birth Parent wants to pump only and formula feed infant. Pumping frequency: Birth Parent will continue to pump every 3 hours for 15 minutes on inital setting.  Interventions Interventions: Breast feeding basics reviewed;Skin to skin;LC Services brochure;Education;DEBP  Discharge Pump:  [facility] sent [facility] Pump referral)  Consult Status Consult Status: Follow-up Date: 10/27/22 Follow-up type: In-patient    Eulis Canner 10/26/2022, 3:10 PM

## 2022-10-27 ENCOUNTER — Encounter (HOSPITAL_COMMUNITY): Payer: Self-pay | Admitting: Family Medicine

## 2022-10-27 MED ORDER — ACETAMINOPHEN 500 MG PO TABS
1000.0000 mg | ORAL_TABLET | Freq: Four times a day (QID) | ORAL | Status: DC
  Administered 2022-10-27 – 2022-10-28 (×4): 1000 mg via ORAL
  Filled 2022-10-27 (×4): qty 2

## 2022-10-27 MED ORDER — OXYCODONE HCL 5 MG PO TABS
5.0000 mg | ORAL_TABLET | ORAL | Status: DC | PRN
  Administered 2022-10-27 – 2022-10-28 (×3): 5 mg via ORAL
  Filled 2022-10-27 (×3): qty 1

## 2022-10-27 NOTE — Lactation Note (Signed)
This note was copied from a baby's chart. Lactation Consultation Note  Patient Name: Boy Arshika Tams [id number] Date: 10/27/2022 Age:26 hours Birth Parent feeding preference is : "Pumping Only" and formula feeding infant.    Term female infant weight loss -0.97%, Per Birth Parent infant is consuming 60 mls of 20 kcal formula per feeding today. LC reviewed pace feeding infant. Birth Parent is using DEBP and now expressing 3 mls of colostrum, recently pumped and will offer her EBM to infant first before formula at the next feeding. Birth Parent plans to continue to pump every 3 hours for 15 minutes on initial setting. Birth Parent knows that EBM is safe for 4 hours whereas formula must be used within 1 hour. Birth Parent will continue to feed infant by cues, on demand,8 to 12 times within 24 hours.    Maternal Data    Feeding    LATCH Score                    Lactation Tools Discussed/Used    Interventions    Discharge    Consult Status      Eulis Canner 10/27/2022, 4:20 PM

## 2022-10-27 NOTE — Progress Notes (Signed)
Post Partum Day 1 Subjective: no complaints, up ad lib, and voiding  Objective: Blood pressure 124/87, pulse 70, temperature 98.6 F (37 C), temperature source Oral, resp. rate 17, height 5\' 3"  (1.6 m), weight 105.1 kg, SpO2 100 %, unknown if currently breastfeeding.  Physical Exam:  General: alert, cooperative, and appears stated age Lochia: appropriate Uterine Fundus: firm DVT Evaluation: No evidence of DVT seen on physical exam.  Recent Labs    10/25/22 2152  HGB 12.1  HCT 36.4    Assessment/Plan: Plan for discharge tomorrow, Breastfeeding, Circumcision prior to discharge, and Contraception depo   LOS: 2 days   Donnamae Jude, MD 10/27/2022, 7:56 AM
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
## 2022-10-28 MED ORDER — MEDROXYPROGESTERONE ACETATE 150 MG/ML IM SUSP
150.0000 mg | Freq: Once | INTRAMUSCULAR | Status: AC
  Administered 2022-10-28: 150 mg via INTRAMUSCULAR
  Filled 2022-10-28: qty 1

## 2022-10-28 MED ORDER — ACETAMINOPHEN 500 MG PO TABS: 1000.0000 mg | ORAL_TABLET | Freq: Four times a day (QID) | ORAL | 1 refills | Status: DC | PRN

## 2022-10-28 MED ORDER — OXYCODONE HCL 5 MG PO TABS: 5.0000 mg | ORAL_TABLET | Freq: Four times a day (QID) | ORAL | 0 refills | Status: DC | PRN

## 2022-10-28 MED ORDER — SENNOSIDES-DOCUSATE SODIUM 8.6-50 MG PO TABS: 2.0000 | ORAL_TABLET | Freq: Two times a day (BID) | ORAL | 2 refills | Status: DC | PRN

## 2022-10-28 MED ORDER — IBUPROFEN 600 MG PO TABS: 600.0000 mg | ORAL_TABLET | Freq: Four times a day (QID) | ORAL | 2 refills | Status: DC | PRN

## 2022-10-28 NOTE — Progress Notes (Signed)
Pediatrician notified CSW that MOB was now interested in Enfamil formula as she was uninterested on yesterday.   CSW provided MOB with 2 cans of formula from [employer], MOB thanked CSW.   Abundio Miu, [place] Worker [facility] Cell#: [phone]

## 2022-10-28 NOTE — Lactation Note (Signed)
This note was copied from a baby's chart. Lactation Consultation Note  Patient Name: Phyllis Kidd [id number] Date: 10/28/2022 Age:26 hours Reason for consult: Follow-up assessment;Primapara;Infant weight loss;Exclusive pumping and bottle feeding LC reviewed Breast pump teaching and the importance of being consistent with pumping to establish and protect the milk supply 8-10 times day. LC reviewed the volume by 7 days to know the supply volume is at a good point.  LC reviewed engorgement prevention and tx . Mom has the Stork pump and LC recommended since the Spectra comes with a #24 and  #28 F and she has been using the 49 F , she may need to order a a insert flange that is compatible with her Spectra online.  Mom aware of the storage of Breast milk and the [facility] resources.    Maternal Data    Feeding Mother's Current Feeding Choice: Breast Milk and Formula Nipple Type: Slow - flow   Lactation Tools Discussed/Used Tools: Pump;Flanges Flange Size: 21;24 Breast pump type: Double-Electric Breast Pump Pump Education: Milk Storage  Interventions Interventions: Breast feeding basics reviewed;DEBP;Education;LC Services brochure  Discharge Discharge Education: Engorgement and breast care;Warning signs for feeding baby Pump: Stork Pump;Personal  Consult Status Consult Status: Complete Date: 10/28/22    Myer Haff 10/28/2022, 12:21 PM

## 2022-11-02 ENCOUNTER — Encounter: Payer: Medicaid Other | Admitting: Obstetrics and Gynecology

## 2022-11-02 ENCOUNTER — Telehealth (HOSPITAL_COMMUNITY): Payer: Self-pay

## 2022-11-04 NOTE — Telephone Encounter (Signed)
Chart review.

## 2022-11-08 ENCOUNTER — Telehealth (HOSPITAL_COMMUNITY): Payer: Self-pay

## 2022-11-08 NOTE — Telephone Encounter (Signed)
Patient reports feeling good. Patient declines questions/concerns about her health and healing.  Patient reports that baby is doing well. Baby sleeps in a bassinet. RN reviewed ABC's of safe sleep with patient. Patient declines any questions or concerns about baby.  EPDS score is 0.  [facility] [place]   11/08/22,1947

## 2022-12-07 ENCOUNTER — Ambulatory Visit: Payer: Medicaid Other | Admitting: Family Medicine

## 2022-12-12 ENCOUNTER — Ambulatory Visit (INDEPENDENT_AMBULATORY_CARE_PROVIDER_SITE_OTHER): Payer: Medicaid Other | Admitting: Advanced Practice Midwife

## 2022-12-12 ENCOUNTER — Encounter: Payer: Self-pay | Admitting: Advanced Practice Midwife

## 2022-12-12 VITALS — BP 119/77 | HR 72 | Ht 63.0 in | Wt 206.2 lb

## 2022-12-12 DIAGNOSIS — O9089 Other complications of the puerperium, not elsewhere classified: Secondary | ICD-10-CM | POA: Diagnosis not present

## 2022-12-12 DIAGNOSIS — R87610 Atypical squamous cells of undetermined significance on cytologic smear of cervix (ASC-US): Secondary | ICD-10-CM | POA: Diagnosis not present

## 2022-12-12 DIAGNOSIS — Z3042 Encounter for surveillance of injectable contraceptive: Secondary | ICD-10-CM | POA: Diagnosis not present

## 2022-12-12 DIAGNOSIS — O34219 Maternal care for unspecified type scar from previous cesarean delivery: Secondary | ICD-10-CM

## 2022-12-12 DIAGNOSIS — R52 Pain, unspecified: Secondary | ICD-10-CM | POA: Diagnosis not present

## 2022-12-12 MED ORDER — OXYCODONE HCL 5 MG PO TABS: 5.0000 mg | ORAL_TABLET | Freq: Four times a day (QID) | ORAL | 0 refills | Status: DC | PRN

## 2022-12-12 MED ORDER — MEDROXYPROGESTERONE ACETATE 150 MG/ML IM SUSP: 150.0000 mg | Freq: Once | INTRAMUSCULAR | Status: AC

## 2022-12-12 NOTE — Progress Notes (Signed)
Post Partum Visit Note  Phyllis Kidd is a 27 y.o. [id number] female who presents for a postpartum visit. She is 6 weeks postpartum following a normal spontaneous vaginal delivery.  I have fully reviewed the prenatal and intrapartum course. The delivery was at 39 gestational weeks.  Anesthesia: epidural. Postpartum course has been good. Baby is doing well. Baby is feeding by bottle - Similac Sensitive RS. Bleeding no bleeding. Bowel function is normal. Bladder function is normal. Patient is not sexually active. Contraception method is Depo-Provera injections. Postpartum depression screening: negative.   The pregnancy intention screening data noted above was reviewed. Potential methods of contraception were discussed. The patient elected to proceed with No data recorded.   Edinburgh Postnatal Depression Scale - 12/12/22 1610       Edinburgh Postnatal Depression Scale:  In the Past 7 Days   I have been able to laugh and see the funny side of things. 0    I have looked forward with enjoyment to things. 0    I have blamed myself unnecessarily when things went wrong. 0    I have been anxious or worried for no good reason. 0    I have felt scared or panicky for no good reason. 0    Things have been getting on top of me. 0    I have been so unhappy that I have had difficulty sleeping. 0    I have felt sad or miserable. 0    I have been so unhappy that I have been crying. 0    The thought of harming myself has occurred to me. 0    Edinburgh Postnatal Depression Scale Total 0             Health Maintenance Due  Topic Date Due   COVID-19 Vaccine (1) Never done   DTaP/Tdap/Td (1 - Tdap) Never done    The following portions of the patient's history were reviewed and updated as appropriate: allergies, current medications, past family history, past medical history, past social history, past surgical history, and problem list.  Review of Systems Pertinent items noted in HPI and remainder  of comprehensive ROS otherwise negative.  Objective:  BP 119/77   Pulse 72   Ht 5\' 3"  (1.6 m)   Wt 206 lb 3.2 oz (93.5 kg)   LMP  (LMP Unknown)   Breastfeeding No   BMI 36.53 kg/m    VS reviewed, nursing note reviewed,  Constitutional: well developed, well nourished, no distress HEENT: normocephalic CV: normal rate Pulm/chest wall: normal effort Abdomen: soft Neuro: alert and oriented x 3 Skin: warm, dry Psych: affect normal   Assessment:   1. Postpartum pain --Pt with cramping abdominal pain associated with pumping/breastfeeding.  She reports that ibuprofen and tylenol are not enough for the pain. There are no other associated symptoms.   ---Discussed using heating pad/warm bath, and pt was happy to try these as she was instructed to avoid them at hospital discharge.   --I will add 10 additional doses of oxycodone for limited use. Pt to continue ibuprofen/Tylenol as first line tx, add heat, and use narcotic pain medication sparingly.  --If pain persists for 2-3 more weeks, or if it worsens or is associated with n/v, fever, pt to follow up with our office, or go to [facility].  - oxyCODONE (OXY IR/ROXICODONE) 5 MG immediate release tablet; Take 1-2 tablets (5-10 mg total) by mouth every 6 (six) hours as needed for breakthrough pain.  Dispense: 10 tablet;  Refill: 0  2. Surveillance for Depo-Provera contraception  - medroxyPROGESTERone (DEPO-PROVERA) injection 150 mg  3. Postpartum care following vaginal delivery --Doing well, bonding well with baby, no concerns except cramping lower abdomen pain with breastfeeding.  4. VBAC (vaginal birth after Cesarean)    Plan:   Essential components of care per ACOG recommendations:  1.  Mood and well being: Patient with negative depression screening today. Reviewed local resources for support.  - Patient tobacco use? No.   - hx of drug use? Yes, marijuana prior to pregnancy  2. Infant care and feeding:  -Patient currently breastmilk  feeding? Yes. Reviewed importance of draining breast regularly to support lactation.  -Social determinants of health (SDOH) reviewed in EPIC. No concerns   3. Sexuality, contraception and birth spacing - Patient does not want a pregnancy in the next year.   - Reviewed reproductive life planning. Reviewed contraceptive methods based on pt preferences and effectiveness.  Patient desired Hormonal Injection today.     4. Sleep and fatigue -Encouraged family/partner/community support of 4 hrs of uninterrupted sleep to help with mood and fatigue  5. Physical Recovery  - Discussed patients delivery and complications. She describes her labor as mixed. - Patient had a Vaginal, no problems at delivery. Patient had a  hemostatic right labial  laceration. Perineal healing reviewed. Patient expressed understanding - Patient has urinary incontinence? No. - Patient is safe to resume physical and sexual activity  6.  Health Maintenance - HM due items addressed Yes - Last pap smear  Diagnosis  Date Value Ref Range Status  05/25/2022 (A)  Final   - Atypical squamous cells of undetermined significance (ASC-US)   Pap smear not done at today's visit.  -Breast Cancer screening indicated? No.   7. Chronic Disease/Pregnancy Condition follow up: None  - PCP follow up  Sharen Counter, CNM [facility] [employer], [facility]

## 2023-01-18 ENCOUNTER — Ambulatory Visit: Payer: Medicaid Other

## 2023-01-21 ENCOUNTER — Emergency Department (HOSPITAL_COMMUNITY): Payer: Medicaid Other

## 2023-01-21 ENCOUNTER — Emergency Department (HOSPITAL_COMMUNITY)
Admission: EM | Admit: 2023-01-21 | Discharge: 2023-01-21 | Disposition: A | Discharge status: Home / Self Care | Payer: Medicaid Other | Attending: Emergency Medicine | Admitting: Emergency Medicine

## 2023-01-21 ENCOUNTER — Other Ambulatory Visit: Payer: Self-pay

## 2023-01-21 DIAGNOSIS — S161XXA Strain of muscle, fascia and tendon at neck level, initial encounter: Secondary | ICD-10-CM | POA: Diagnosis not present

## 2023-01-21 DIAGNOSIS — M542 Cervicalgia: Secondary | ICD-10-CM | POA: Diagnosis not present

## 2023-01-21 DIAGNOSIS — I10 Essential (primary) hypertension: Secondary | ICD-10-CM | POA: Diagnosis not present

## 2023-01-21 DIAGNOSIS — R519 Headache, unspecified: Secondary | ICD-10-CM | POA: Diagnosis not present

## 2023-01-21 DIAGNOSIS — M25511 Pain in right shoulder: Secondary | ICD-10-CM | POA: Insufficient documentation

## 2023-01-21 DIAGNOSIS — R079 Chest pain, unspecified: Secondary | ICD-10-CM | POA: Diagnosis not present

## 2023-01-21 DIAGNOSIS — Z041 Encounter for examination and observation following transport accident: Secondary | ICD-10-CM | POA: Diagnosis not present

## 2023-01-21 DIAGNOSIS — Y92410 Unspecified street and highway as the place of occurrence of the external cause: Secondary | ICD-10-CM | POA: Diagnosis not present

## 2023-01-21 DIAGNOSIS — S199XXA Unspecified injury of neck, initial encounter: Secondary | ICD-10-CM | POA: Diagnosis present

## 2023-01-21 MED ORDER — HYDROCODONE-ACETAMINOPHEN 5-325 MG PO TABS
1.0000 | ORAL_TABLET | Freq: Once | ORAL | Status: AC
  Administered 2023-01-21: 1 via ORAL
  Filled 2023-01-21: qty 1

## 2023-01-21 MED ORDER — CYCLOBENZAPRINE HCL 10 MG PO TABS: 10.0000 mg | ORAL_TABLET | Freq: Two times a day (BID) | ORAL | 0 refills | Status: DC | PRN
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
## 2023-01-23 ENCOUNTER — Ambulatory Visit: Payer: Medicaid Other

## 2023-01-23 ENCOUNTER — Other Ambulatory Visit: Payer: Self-pay | Admitting: *Deleted

## 2023-01-23 DIAGNOSIS — Z3042 Encounter for surveillance of injectable contraceptive: Secondary | ICD-10-CM

## 2023-01-23 MED ORDER — MEDROXYPROGESTERONE ACETATE 150 MG/ML IM SUSP
150.0000 mg | INTRAMUSCULAR | 3 refills | Status: DC
Start: 2023-01-23 — End: 2024-02-05

## 2023-01-23 NOTE — Progress Notes (Signed)
RX Depo with 3 refills. Postpartum visit not indicates intention for Depo contraception for pt. Rec'd 1st dose in the hospital. Dose due now.

## 2023-04-02 IMAGING — US US PELVIS COMPLETE WITH TRANSVAGINAL
1 series · 13 of 25 positions shown · non-contrast
Comparison: None

CLINICAL DATA: CP pubic pain, patient states she can feeling not in
her RIGHT lower quadrant, LMP 05/03/2021, history of Caesarean
section

EXAM:
TRANSABDOMINAL AND TRANSVAGINAL ULTRASOUND OF PELVIS
TECHNIQUE: Both transabdominal and transvaginal ultrasound examinations of the
pelvis were performed. Transabdominal technique was performed for
global imaging of the pelvis including uterus, ovaries, adnexal
regions, and pelvic cul-de-sac. It was necessary to proceed with
endovaginal exam following the transabdominal exam to visualize the
endometrium and LEFT ovary, and to characterize a RIGHT ovarian
lesion.

[Series 1: us pelvis transvaginal non-ob (tv only) · 136 acquisitions, 13 frames shown]
[im 1/136]
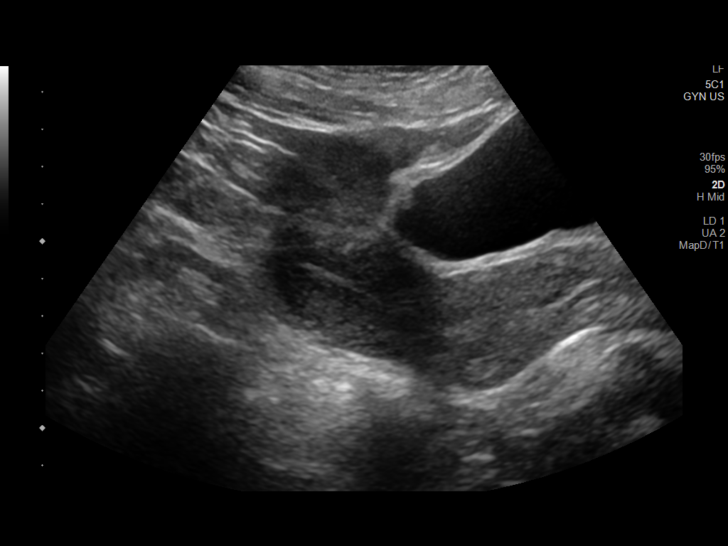
[im 12/136]
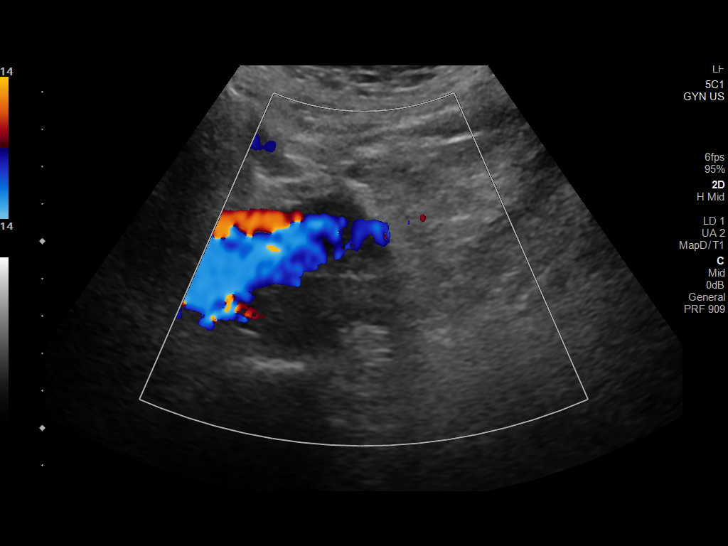
[im 23/136]
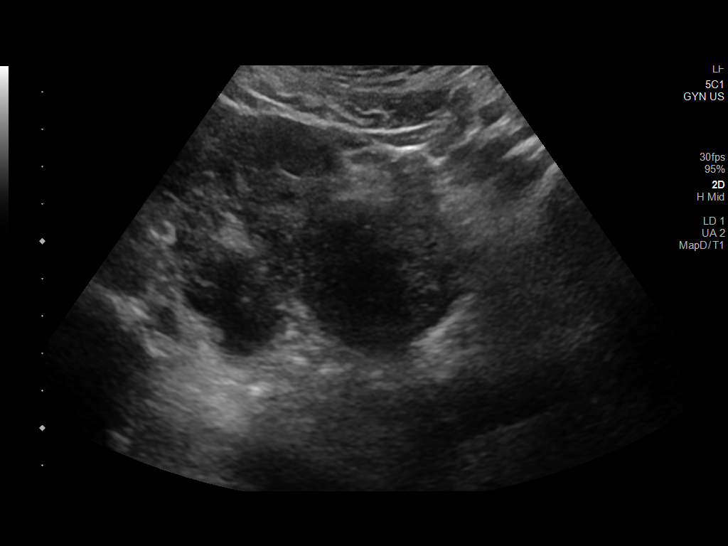
[im 34/136]
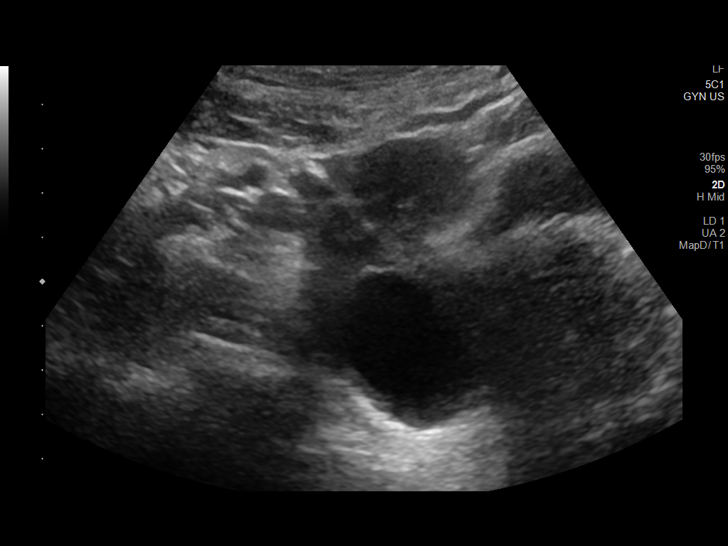
[im 46/136]
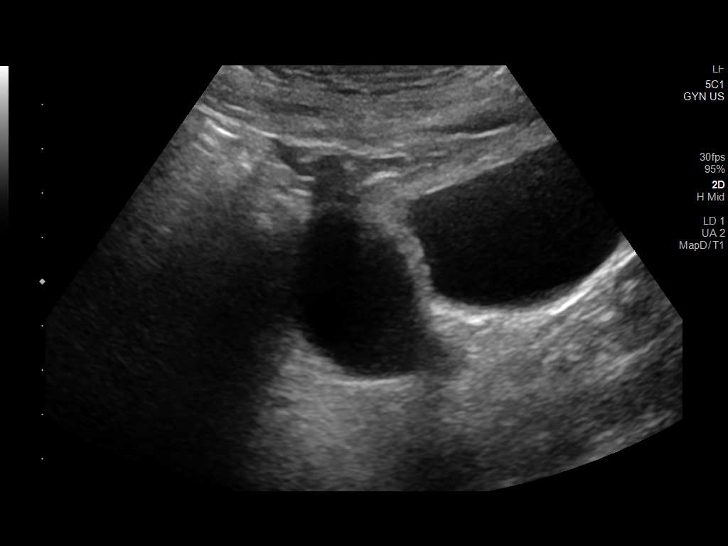
[im 57/136]
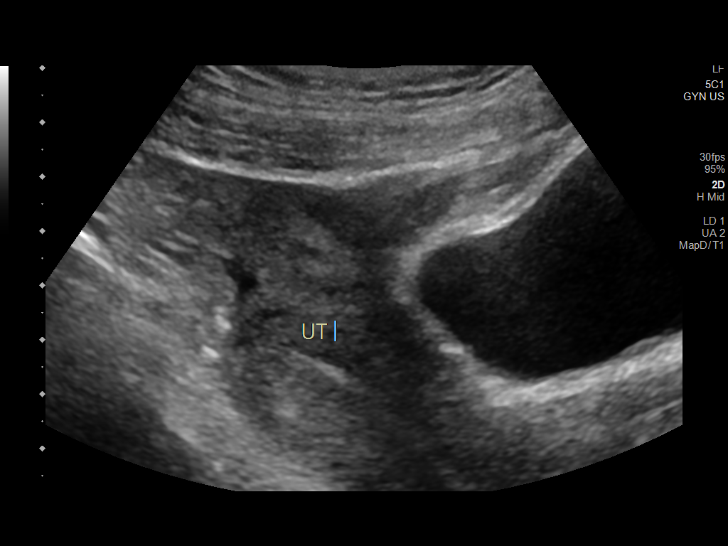
[im 68/136]
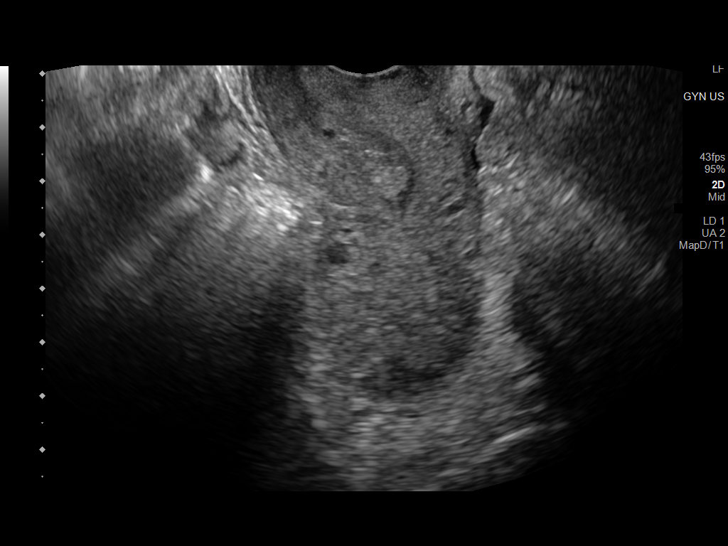
[im 79/136]
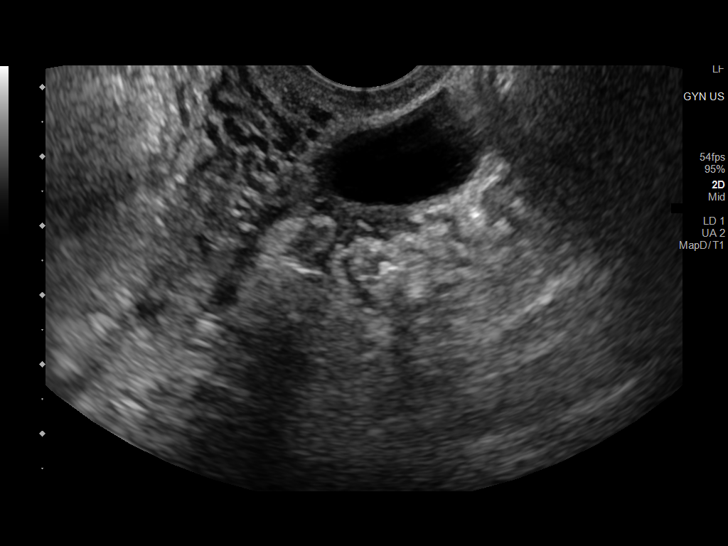
[im 91/136]
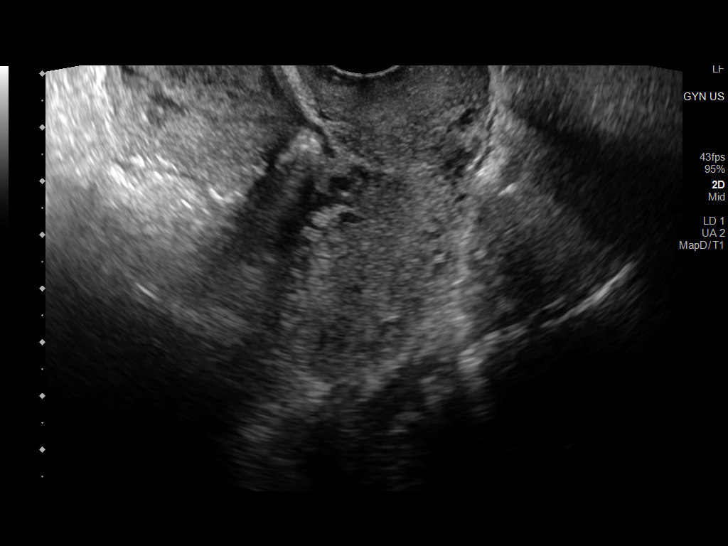
[im 102/136]
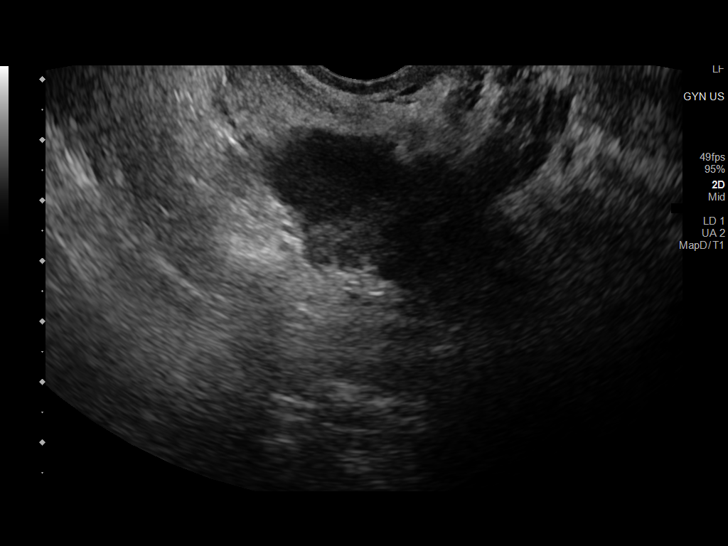
[im 113/136]
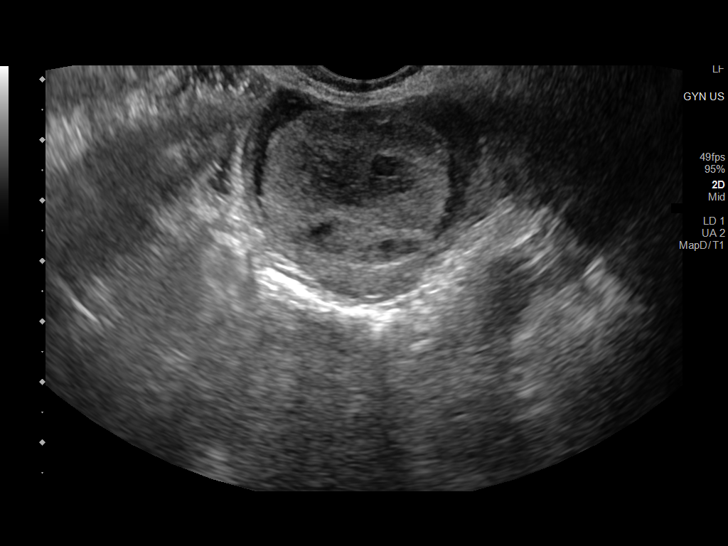
[im 124/136]
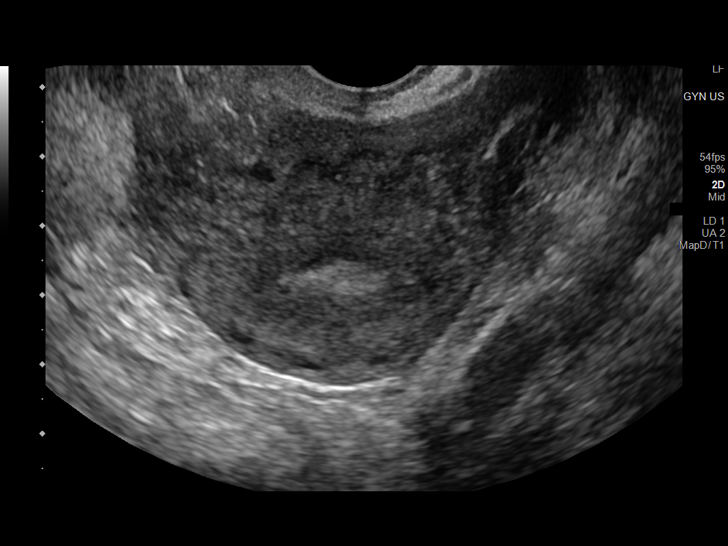
[im 136/136]
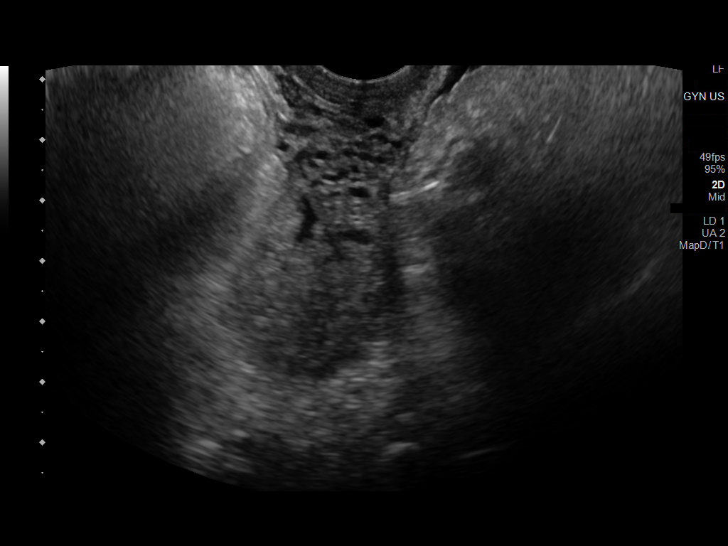

[13 of 25 positions shown; findings below may reference images not displayed]

FINDINGS: Uterus

Measurements: 7.7 x 3.5 x 4.8 cm = volume: 67 mL. Mildly retroverted
and anteflexed. Heterogeneous echogenicity. Normal morphology
without mass

Endometrium

Thickness: 7 mm.  No endometrial fluid or focal abnormality

Right ovary

Measurements: 4.1 x 2.9 x 4.0 cm = volume: 24.4 mL. Simple cyst
RIGHT ovary 3.8 x 3.1 x 2.2 cm; No follow up imaging recommended.
Note: This recommendation does not apply to premenarchal patients or
to those with increased risk (genetic, family history, elevated
tumor markers or other high-risk factors) of ovarian cancer.
Reference: Radiology [DATE]):[id number]. no additional masses.

Left ovary

Measurements: 4.4 x 3.3 x 4.3 cm = volume: 31.8 mL. Complex cystic
lesion of LEFT ovary 3.7 x 3.7 x 2.6 cm, containing a complex cystic
fluid as well as a in internal mural nodule versus retracted clot
measuring 3.4 x 2.5 x 3.1 cm. No internal blood flow seen within
this nodular focus on color Doppler imaging.

Other findings

No free pelvic fluid or other adnexal masses.
IMPRESSION: Simple cyst RIGHT ovary 3.8 cm greatest diameter; no follow-up
imaging recommended.

Complex cystic lesion LEFT ovary 3.7 cm greatest diameter,
containing internal nodule versus retracted clot; short-term
follow-up ultrasound recommended in 6-12 weeks to reassess, with
further recommendations based on size and appearance at follow-up
imaging.

Unremarkable uterus and endometrial complex.

## 2023-11-07 ENCOUNTER — Ambulatory Visit (HOSPITAL_COMMUNITY)
Admission: EM | Admit: 2023-11-07 | Discharge: 2023-11-07 | Disposition: A | Discharge status: Home / Self Care | Attending: Emergency Medicine | Admitting: Emergency Medicine

## 2023-11-07 ENCOUNTER — Encounter (HOSPITAL_COMMUNITY): Payer: Self-pay

## 2023-11-07 ENCOUNTER — Ambulatory Visit (INDEPENDENT_AMBULATORY_CARE_PROVIDER_SITE_OTHER)

## 2023-11-07 DIAGNOSIS — S99922A Unspecified injury of left foot, initial encounter: Secondary | ICD-10-CM

## 2023-11-07 DIAGNOSIS — M79672 Pain in left foot: Secondary | ICD-10-CM | POA: Diagnosis not present

## 2023-11-07 MED ORDER — IBUPROFEN 800 MG PO TABS: 800.0000 mg | ORAL_TABLET | Freq: Three times a day (TID) | ORAL | 0 refills | Status: AC
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
## 2023-11-07 NOTE — Discharge Instructions (Signed)
 Rest - try to avoid high impact activity Ice - apply for 20 minutes a few times daily Compression - use ace wrap for support  Elevation - prop up on a pillow Ibuprofen - 1 tablet every 6 hours as needed Please follow with the foot specialist if pain persists without improvement after 1 week
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
## 2024-01-19 ENCOUNTER — Ambulatory Visit: Admitting: Student

## 2024-01-19 ENCOUNTER — Encounter: Payer: Self-pay | Admitting: Student

## 2024-01-19 ENCOUNTER — Ambulatory Visit: Payer: Self-pay

## 2024-01-19 VITALS — BP 105/57 | HR 60 | Temp 98.3°F | Ht 63.0 in | Wt 217.2 lb

## 2024-01-19 DIAGNOSIS — G8929 Other chronic pain: Secondary | ICD-10-CM | POA: Diagnosis not present

## 2024-01-19 DIAGNOSIS — M25562 Pain in left knee: Secondary | ICD-10-CM

## 2024-01-19 DIAGNOSIS — M542 Cervicalgia: Secondary | ICD-10-CM | POA: Diagnosis present

## 2024-01-19 MED ORDER — NAPROXEN 500 MG PO TABS: 500.0000 mg | ORAL_TABLET | Freq: Two times a day (BID) | ORAL | 0 refills | Status: AC | PRN

## 2024-01-19 MED ORDER — SUMATRIPTAN SUCCINATE 50 MG PO TABS: 50.0000 mg | ORAL_TABLET | ORAL | 0 refills | Status: AC | PRN

## 2024-01-19 NOTE — Patient Instructions (Signed)
 Thank you, Ms.Joeanna Stefanko for allowing us  to provide your care today. Today we discussed your head and neck pain.    I have ordered the following medication/changed the following medications:   Start the following medications: Meds ordered this encounter  Medications   SUMAtriptan (IMITREX) 50 MG tablet    Sig: Take 1 tablet (50 mg total) by mouth every 2 (two) hours as needed for migraine. May repeat in 2 hours if headache persists or recurs.    Dispense:  10 tablet    Refill:  0   naproxen  (NAPROSYN ) 500 MG tablet    Sig: Take 1 tablet (500 mg total) by mouth 2 (two) times daily as needed.    Dispense:  30 tablet    Refill:  0     Follow up: 4 weeks  We look forward to seeing you next time. Please call our clinic at [phone] if you have any questions or concerns. The best time to call is Monday-Friday from 9am-4pm, but there is someone available 24/7. If after hours or the weekend, call the main hospital number and ask for the Internal Medicine Resident On-Call. If you need medication refills, please notify your pharmacy one week in advance and they will send us  a request.   Thank you for trusting me with your care. Wishing you the best!   Dorthy Gavia, MD [facility]
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
## 2024-01-19 NOTE — Assessment & Plan Note (Signed)
 She states that she has significant pain in the anterior portion of her left knee, near the attachment point of the patellar tendon on the tibial bone.  It began last year at some point when she felt her knee pop and has continued since then.  She says she is unable to go up stairs well and has to put most of her weight on her right leg.  She says she has not tried anything for this or had it evaluated in the past.  I do not appreciate any significant swelling in the left knee compared to the right on my exam today.  There is tenderness to palpation throughout the knee, worse in the anterior portion inferior to the patella, but present throughout.  Anterior and posterior drawer tests were negative on my exam.  She had pain with these tests and with varus and valgus stress.  She does appear to be able to ambulate relatively normally on flat surfaces.  For now, we will treat this conservatively.  I have recommended she get a knee brace at the pharmacy and the naproxen  should help with this pain.  We can reevaluate this at her next visit and consider whether we need to do further imaging and/or physical therapy.
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
## 2024-01-26 NOTE — Progress Notes (Signed)
 Internal Medicine Clinic Attending  Case discussed with the resident at the time of the visit.  We reviewed the resident's history and exam and pertinent patient test results.  I agree with the assessment, diagnosis, and plan of care documented in the resident's note.

## 2024-02-05 ENCOUNTER — Other Ambulatory Visit: Payer: Self-pay

## 2024-02-05 DIAGNOSIS — Z3042 Encounter for surveillance of injectable contraceptive: Secondary | ICD-10-CM

## 2024-02-05 MED ORDER — MEDROXYPROGESTERONE ACETATE 150 MG/ML IM SUSP
150.0000 mg | INTRAMUSCULAR | 3 refills | Status: AC
Start: 2024-02-05 — End: ?

## 2024-02-06 ENCOUNTER — Emergency Department (HOSPITAL_COMMUNITY)
Admission: EM | Admit: 2024-02-06 | Discharge: 2024-02-06 | Disposition: A | Discharge status: Home / Self Care | Attending: Emergency Medicine | Admitting: Emergency Medicine

## 2024-02-06 ENCOUNTER — Encounter (HOSPITAL_COMMUNITY): Payer: Self-pay

## 2024-02-06 ENCOUNTER — Emergency Department (HOSPITAL_COMMUNITY)

## 2024-02-06 ENCOUNTER — Other Ambulatory Visit: Payer: Self-pay

## 2024-02-06 DIAGNOSIS — W010XXA Fall on same level from slipping, tripping and stumbling without subsequent striking against object, initial encounter: Secondary | ICD-10-CM | POA: Insufficient documentation

## 2024-02-06 DIAGNOSIS — Y9302 Activity, running: Secondary | ICD-10-CM | POA: Insufficient documentation

## 2024-02-06 DIAGNOSIS — M25562 Pain in left knee: Secondary | ICD-10-CM | POA: Diagnosis not present

## 2024-02-06 DIAGNOSIS — M7989 Other specified soft tissue disorders: Secondary | ICD-10-CM | POA: Diagnosis not present

## 2024-02-06 DIAGNOSIS — W19XXXA Unspecified fall, initial encounter: Secondary | ICD-10-CM

## 2024-02-06 MED ORDER — OXYCODONE-ACETAMINOPHEN 5-325 MG PO TABS: 1.0000 | ORAL_TABLET | ORAL | 0 refills | Status: AC | PRN

## 2024-02-06 MED ORDER — OXYCODONE-ACETAMINOPHEN 5-325 MG PO TABS
1.0000 | ORAL_TABLET | Freq: Once | ORAL | Status: AC
  Administered 2024-02-06: 1 via ORAL
  Filled 2024-02-06: qty 1

## 2024-02-06 NOTE — Discharge Instructions (Signed)
 Your history, exam, workup today are consistent with a likely nonbony injury to your left knee.  While we hope this is just a sprain, as we discussed, the x-ray did not show fracture but this does not rule out a ligamentous or other nonbony injury in the knee.  Please use knee immobilizer and crutches and pain medicine and rested.  Please follow-up with orthopedics if it continues to give you problems.  If any symptoms change or worsen acutely, return to the nearest emergency department.

## 2024-02-06 NOTE — Progress Notes (Signed)
 Orthopedic Tech Progress Note Patient Details:  Phyllis Kidd Mar 28, 1996 [id number]  Ortho Devices Type of Ortho Device: Knee Immobilizer, Crutches Ortho Device/Splint Location: left knee immobilizer applied. crutches sized and  instructed on use Ortho Device/Splint Interventions: Ordered, Application, Adjustment   Post Interventions Patient Tolerated: Well, Ambulated well Instructions Provided: Adjustment of device, Care of device  Waylan Thom Loving 02/06/2024, 11:03 AM

## 2024-02-06 NOTE — ED Triage Notes (Signed)
 Pr arrived reporting she fell last night while playing around with her sister. Left knee pain. Swelling, no other symptoms

## 2024-02-06 NOTE — ED Provider Notes (Signed)
 [place] EMERGENCY DEPARTMENT AT [facility] Provider Note   CSN: [id number] Arrival date & time: 02/06/24  9177     Patient presents with: Fall and Knee Pain   Lynia Landry is a 28 y.o. female.   The history is provided by the patient and medical records. No language interpreter was used.  Fall This is a new problem. The current episode started yesterday. The problem has not changed since onset.Pertinent negatives include no chest pain, no abdominal pain, no headaches and no shortness of breath. The symptoms are aggravated by walking. Nothing relieves the symptoms. She has tried nothing for the symptoms. The treatment provided no relief.  Knee Pain Associated symptoms: no back pain, no fever and no neck pain        Prior to Admission medications   Medication Sig Start Date End Date Taking? Authorizing Provider  ibuprofen  (ADVIL ) 800 MG tablet Take 1 tablet (800 mg total) by mouth 3 (three) times daily. 11/07/23   Rising, Asberry, PA-C  medroxyPROGESTERone  (DEPO-PROVERA ) 150 MG/ML injection Inject 1 mL (150 mg total) into the muscle every 3 (three) months. 02/05/24   Leftwich-Kirby, Olam LABOR, CNM  naproxen  (NAPROSYN ) 500 MG tablet Take 1 tablet (500 mg total) by mouth 2 (two) times daily as needed. 01/19/24 02/18/24  Gregary Sharper, MD  SUMAtriptan  (IMITREX ) 50 MG tablet Take 1 tablet (50 mg total) by mouth every 2 (two) hours as needed for migraine. May repeat in 2 hours if headache persists or recurs. 01/19/24   Gregary Sharper, MD    Allergies: Patient has no known allergies.    Review of Systems  Constitutional:  Negative for fever.  HENT:  Negative for congestion.   Respiratory:  Negative for cough, chest tightness, shortness of breath and wheezing.   Cardiovascular:  Negative for chest pain.  Gastrointestinal:  Negative for abdominal pain.  Genitourinary:  Negative for dysuria and flank pain.  Musculoskeletal:  Negative for back pain and neck pain.  Skin:   Negative for wound.  Neurological:  Negative for weakness, light-headedness, numbness and headaches.  Psychiatric/Behavioral:  Negative for agitation and confusion.   All other systems reviewed and are negative.   Updated Vital Signs BP 132/74 (BP Location: Left Arm)   Pulse 68   Temp 98.6 F (37 C) (Oral)   Resp 16   Ht 5' 3 (1.6 m)   Wt 98 kg   SpO2 99%   BMI 38.27 kg/m   Physical Exam Vitals and nursing note reviewed.  Constitutional:      General: She is not in acute distress.    Appearance: She is well-developed. She is not ill-appearing, toxic-appearing or diaphoretic.  HENT:     Head: Normocephalic and atraumatic.   Eyes:     Conjunctiva/sclera: Conjunctivae normal.    Cardiovascular:     Rate and Rhythm: Normal rate and regular rhythm.     Heart sounds: No murmur heard. Pulmonary:     Effort: Pulmonary effort is normal. No respiratory distress.     Breath sounds: Normal breath sounds. No wheezing, rhonchi or rales.  Chest:     Chest wall: No tenderness.  Abdominal:     Palpations: Abdomen is soft.     Tenderness: There is no abdominal tenderness.   Musculoskeletal:        General: Swelling, tenderness and signs of injury present.     Cervical back: Neck supple.     Right lower leg: No edema.     Left  lower leg: No edema.   Skin:    General: Skin is warm and dry.     Capillary Refill: Capillary refill takes less than 2 seconds.     Findings: No erythema.   Neurological:     General: No focal deficit present.     Mental Status: She is alert.   Psychiatric:        Mood and Affect: Mood normal.     (all labs ordered are listed, but only abnormal results are displayed) Labs Reviewed - No data to display  EKG: None  Radiology: DG Knee Complete 4 Views Left Result Date: 02/06/2024 CLINICAL DATA:  Pain and swelling EXAM: LEFT KNEE - COMPLETE 4 VIEW COMPARISON:  None Available. FINDINGS: No fracture or dislocation. Preserved joint spaces and bone  mineralization. No joint effusion lateral view. IMPRESSION: No acute osseous abnormality Electronically Signed   By: Ranell Bring M.D.   On: 02/06/2024 09:55     Procedures   Medications Ordered in the ED  oxyCODONE -acetaminophen  (PERCOCET/ROXICET) 5-325 MG per tablet 1 tablet (1 tablet Oral Given 02/06/24 0925)                                    Medical Decision Making Amount and/or Complexity of Data Reviewed Radiology: ordered.  Risk Prescription drug management.    Nyliah Potts is a 28 y.o. female with no significant past medical history who presents with left knee injury.  According to patient, she was playing and running yesterday when she tripped and fell and landed on her left knee.  She reports it bent medially briefly and then has been having pain.  She denies any ankle pain, hip pain or back pain.  Denies hitting her head.  Denies other complaints.  Denies any preceding symptoms.  She denies numbness or weakness.  Reports it is swollen.  Denies any rashes or skin injuries.  She has never had any injury or any surgery before.  On my exam, lungs clear and chest nontender.  Back nontender.  Hip nontender.  Ankle nontender.  Intact sensation strength and pulses distally.  She has tenderness and swelling of the left knee and is tender all around the knee joint.  It is painful when I move it as well.  Clinically I suspect she may have sprained it or torn a nonbony part of the knee.  She had intact pulses so do not feel she needs CTA but we will get x-ray initially.  If x-ray reassuring and there is no bony injury seen, suspect a nonbony injury and will use knee immobilizer crutches pain medicine and orthopedic follow-up.  Anticipate reassessment after imaging completion.   10:56 AM X-ray does not show acute bony injury.  Patient reports her pain is feeling better after the medication.  Will give her crutches and knee immobilizer and she will follow-up with orthopedics if it  persist.  She agreed with plan of care and was discharged in good condition.      Final diagnoses:  Acute pain of left knee  Fall, initial encounter    ED Discharge Orders          Ordered    oxyCODONE -acetaminophen  (PERCOCET/ROXICET) 5-325 MG tablet  Every 4 hours PRN        02/06/24 1055            Clinical Impression: 1. Acute pain of left knee   2. Fall,  initial encounter     Disposition: Discharge  Condition: Good  I have discussed the results, Dx and Tx plan with the pt(& family if present). He/she/they expressed understanding and agree(s) with the plan. Discharge instructions discussed at great length. Strict return precautions discussed and pt &/or family have verbalized understanding of the instructions. No further questions at time of discharge.    Current Discharge Medication List     START taking these medications   Details  oxyCODONE -acetaminophen  (PERCOCET/ROXICET) 5-325 MG tablet Take 1 tablet by mouth every 4 (four) hours as needed for severe pain (pain score 7-10). Qty: 15 tablet, Refills: 0        Follow Up: [facility] [address] [place] [address] [phone]        Kailea Dannemiller, Lonni PARAS, MD 02/06/24 1058

## 2024-03-18 ENCOUNTER — Ambulatory Visit

## 2024-03-18 ENCOUNTER — Ambulatory Visit: Admitting: *Deleted

## 2024-03-18 DIAGNOSIS — Z23 Encounter for immunization: Secondary | ICD-10-CM

## 2024-03-20 ENCOUNTER — Ambulatory Visit

## 2024-03-20 LAB — TB SKIN TEST
Induration: <1 mm
TB Skin Test: NEGATIVE

## 2024-03-25 ENCOUNTER — Ambulatory Visit: Payer: Self-pay

## 2024-04-17 ENCOUNTER — Ambulatory Visit (INDEPENDENT_AMBULATORY_CARE_PROVIDER_SITE_OTHER)

## 2024-04-17 ENCOUNTER — Other Ambulatory Visit: Payer: Self-pay

## 2024-04-17 VITALS — BP 116/56 | HR 68 | Temp 98.1°F | Ht 64.0 in | Wt 214.2 lb

## 2024-04-17 DIAGNOSIS — G8929 Other chronic pain: Secondary | ICD-10-CM

## 2024-04-17 DIAGNOSIS — R109 Unspecified abdominal pain: Secondary | ICD-10-CM

## 2024-04-17 DIAGNOSIS — M542 Cervicalgia: Secondary | ICD-10-CM | POA: Diagnosis present

## 2024-04-17 MED ORDER — CYCLOBENZAPRINE HCL 5 MG PO TABS: 5.0000 mg | ORAL_TABLET | Freq: Every evening | ORAL | 0 refills | Status: AC

## 2024-04-17 MED ORDER — DICLOFENAC SODIUM 1 % EX GEL: 4.0000 g | Freq: Four times a day (QID) | CUTANEOUS | 1 refills | Status: AC
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
## 2024-04-17 NOTE — Patient Instructions (Addendum)
 It was wonderful seeing you today!   We talked about your head and neck pain.SABRASABRA  1) Start taking Flexeril  one tablet at night, this is a muscle relaxant  2) Apply heating pad to your neck for 10-15 minutes, then massage in the voltaren  gel to the entire middle back and neck.  How to make heating pad:  1. Fill the sock: . Fill the sock about halfway with rice. You can add more or less rice depending on the desired size and heat of the heating pad.  2. Seal the sock: . Tie a knot at the top of the sock or sew it shut.  3. Microwave: SABRA Place the sock in a microwave-safe bowl and heat it in 30-second intervals, checking it frequently.  4. Test the temperature: . Before using the heating pad, test its temperature by placing it on your skin for a few seconds. It should be warm but not hot enough to burn.  5. Use the heating pad: SABRA Apply the heating pad to the desired area, such as your back, neck, or shoulders. You can wrap it in a towel for added warmth.   And don't forget to get your flu shot before the end of October!  If you have any questions please feel free to the call the clinic at anytime at [phone].  Have a blessed day,  Dr. Charmayne

## 2024-05-04 NOTE — Progress Notes (Signed)
 Internal Medicine Clinic Attending  I was physically present during the key portions of the resident provided service and participated in the medical decision making of patient's management care. I reviewed pertinent patient test results.  The assessment, diagnosis, and plan were formulated together and I agree with the documentation in the resident's note. I can attest to the tight and tender R paracervical muscles, the palpation of which caused her R hemicranium to hurt.  Agree with cervical muscle tension headache and recommendations. Muscle relaxant should be short term only.  Trudy Mliss Dragon, MD

## 2024-06-29 ENCOUNTER — Encounter (HOSPITAL_COMMUNITY): Payer: Self-pay

## 2024-06-29 ENCOUNTER — Emergency Department (HOSPITAL_COMMUNITY)

## 2024-06-29 ENCOUNTER — Emergency Department (HOSPITAL_COMMUNITY)
Admission: EM | Admit: 2024-06-29 | Discharge: 2024-06-29 | Disposition: A | Discharge status: Home / Self Care | Attending: Emergency Medicine | Admitting: Emergency Medicine

## 2024-06-29 ENCOUNTER — Other Ambulatory Visit: Payer: Self-pay

## 2024-06-29 DIAGNOSIS — G43909 Migraine, unspecified, not intractable, without status migrainosus: Secondary | ICD-10-CM | POA: Insufficient documentation

## 2024-06-29 DIAGNOSIS — F419 Anxiety disorder, unspecified: Secondary | ICD-10-CM | POA: Diagnosis not present

## 2024-06-29 DIAGNOSIS — R519 Headache, unspecified: Secondary | ICD-10-CM | POA: Diagnosis present

## 2024-06-29 LAB — COMPREHENSIVE METABOLIC PANEL WITH GFR
ALT: 12 U/L (ref 0–44)
AST: 19 U/L (ref 15–41)
Albumin: 4.3 g/dL (ref 3.5–5.0)
Alkaline Phosphatase: 49 U/L (ref 38–126)
Anion gap: 9 (ref 5–15)
BUN: 9 mg/dL (ref 6–20)
CO2: 25 mmol/L (ref 22–32)
Calcium: 9.6 mg/dL (ref 8.9–10.3)
Chloride: 108 mmol/L (ref 98–111)
Creatinine, Ser: 0.99 mg/dL (ref 0.44–1.00)
GFR, Estimated: >60 mL/min (ref >60)
Glucose, Bld: 94 mg/dL (ref 70–99)
Potassium: 3.9 mmol/L (ref 3.5–5.1)
Sodium: 141 mmol/L (ref 135–145)
Total Bilirubin: 0.4 mg/dL (ref 0.0–1.2)
Total Protein: 7 g/dL (ref 6.5–8.1)

## 2024-06-29 LAB — CBC WITH DIFFERENTIAL/PLATELET
Abs Immature Granulocytes: 0.09 K/uL — ABNORMAL HIGH (ref 0.00–0.07)
Basophils Absolute: 0 K/uL (ref 0.0–0.1)
Basophils Relative: 0 %
Eosinophils Absolute: 0 K/uL (ref 0.0–0.5)
Eosinophils Relative: 0 %
HCT: 39.6 % (ref 36.0–46.0)
Hemoglobin: 13.3 g/dL (ref 12.0–15.0)
Immature Granulocytes: 1 %
Lymphocytes Relative: 16 %
Lymphs Abs: 1.3 K/uL (ref 0.7–4.0)
MCH: 29.8 pg (ref 26.0–34.0)
MCHC: 33.6 g/dL (ref 30.0–36.0)
MCV: 88.6 fL (ref 80.0–100.0)
Monocytes Absolute: 0.3 K/uL (ref 0.1–1.0)
Monocytes Relative: 4 %
Neutro Abs: 6.3 K/uL (ref 1.7–7.7)
Neutrophils Relative %: 79 %
Platelets: 387 K/uL (ref 150–400)
RBC: 4.47 MIL/uL (ref 3.87–5.11)
RDW: 12.5 % (ref 11.5–15.5)
WBC: 8 K/uL (ref 4.0–10.5)
nRBC: 0 % (ref 0.0–0.2)

## 2024-06-29 LAB — URINALYSIS, ROUTINE W REFLEX MICROSCOPIC
Bilirubin Urine: NEGATIVE
Glucose, UA: NEGATIVE mg/dL
Hgb urine dipstick: NEGATIVE
Ketones, ur: 5 mg/dL — AB
Leukocytes,Ua: NEGATIVE
Nitrite: NEGATIVE
Protein, ur: NEGATIVE mg/dL
Specific Gravity, Urine: 1.006 (ref 1.005–1.030)
pH: 9 — ABNORMAL HIGH (ref 5.0–8.0)

## 2024-06-29 LAB — HCG, SERUM, QUALITATIVE: Preg, Serum: NEGATIVE

## 2024-06-29 MED ORDER — ACETAMINOPHEN 500 MG PO TABS
1000.0000 mg | ORAL_TABLET | Freq: Once | ORAL | Status: AC
  Administered 2024-06-29: 1000 mg via ORAL
  Filled 2024-06-29: qty 2

## 2024-06-29 MED ORDER — ONDANSETRON HCL 4 MG/2ML IJ SOLN
4.0000 mg | Freq: Once | INTRAMUSCULAR | Status: AC
  Administered 2024-06-29: 4 mg via INTRAVENOUS
  Filled 2024-06-29: qty 2

## 2024-06-29 MED ORDER — ONDANSETRON 4 MG PO TBDP: 4.0000 mg | ORAL_TABLET | Freq: Three times a day (TID) | ORAL | 0 refills | Status: AC | PRN

## 2024-06-29 MED ORDER — METOCLOPRAMIDE HCL 5 MG/ML IJ SOLN
10.0000 mg | Freq: Once | INTRAMUSCULAR | Status: AC
  Administered 2024-06-29: 10 mg via INTRAVENOUS
  Filled 2024-06-29: qty 2

## 2024-06-29 NOTE — ED Triage Notes (Signed)
 Pt arrives via [facility]. PT reports waking up around 0600 this morning with a headache, pain behind her right eye, and blurred vision in right eye. Pt states throughout the day, the pain has gotten worse. She reports she has had some nausea, vomiting, abdominal pain and numbness to face and all extremities. Pt arrives AxOx4.

## 2024-06-29 NOTE — ED Provider Notes (Signed)
 [place] [facility] AT [facility] Provider Note   CSN: [id number] Arrival date & time: 06/29/24  1640     Patient presents with: Headache, Numbness, and Abdominal Pain   Phyllis Kidd is a 28 y.o. female.   Patient is here for acute headache that began at 6 AM this morning waking her from sleep.  2 to 3 hours later she started developing right eye pain with associated puffiness above the right eye; right eye pain is described as behind the eye.  Shortly after eye pain developed she reports nausea followed by numerous episodes of vomiting. Diffuse lower abdominal pain developed after vomiting.  She also notes numbness of bilateral feet then numbness all over her body; this numbness soon resolved fully and is no longer present at this time.  She has a history of migraines, further stating the headache and right eye pain is typical with her migraines however, the persistent vomiting and numbness are not.  She has not taken any over-the-counter medications as she is afraid to because she is not sure what is going on.  She denies recent head injury or loss of consciousness.  She denies recent fever or change in vision.  She reports photophobia.  She has not vomited since she was at home.  The history is provided by the patient.  Headache Associated symptoms: abdominal pain   Abdominal Pain      Prior to Admission medications   Medication Sig Start Date End Date Taking? Authorizing Provider  ondansetron  (ZOFRAN -ODT) 4 MG disintegrating tablet Take 1 tablet (4 mg total) by mouth every 8 (eight) hours as needed for nausea or vomiting. 06/29/24  Yes Rosina Almarie LABOR, PA-C  cyclobenzaprine  (FLEXERIL ) 5 MG tablet Take 1 tablet (5 mg total) by mouth at bedtime. 04/17/24   Charmayne Holmes, DO  diclofenac  Sodium (VOLTAREN ) 1 % GEL Apply 4 g topically 4 (four) times daily. 04/17/24   Charmayne Holmes, DO  ibuprofen  (ADVIL ) 800 MG tablet Take 1 tablet (800 mg total) by mouth  3 (three) times daily. 11/07/23   Rising, Asberry, PA-C  medroxyPROGESTERone  (DEPO-PROVERA ) 150 MG/ML injection Inject 1 mL (150 mg total) into the muscle every 3 (three) months. 02/05/24   Leftwich-Kirby, Olam LABOR, CNM  oxyCODONE -acetaminophen  (PERCOCET/ROXICET) 5-325 MG tablet Take 1 tablet by mouth every 4 (four) hours as needed for severe pain (pain score 7-10). 02/06/24   Tegeler, Lonni PARAS, MD  SUMAtriptan  (IMITREX ) 50 MG tablet Take 1 tablet (50 mg total) by mouth every 2 (two) hours as needed for migraine. May repeat in 2 hours if headache persists or recurs. 01/19/24   Gregary Sharper, MD    Allergies: Patient has no known allergies.    Review of Systems  Gastrointestinal:  Positive for abdominal pain.  Neurological:  Positive for headaches.   Vitals:   06/29/24 1652 06/29/24 1653 06/29/24 1710 06/29/24 2000  BP: 133/72  109/64 (!) 91/49  Pulse: 66 66 66 74  Resp: 20 16 11  (!) 22  Temp: 99 F (37.2 C)     TempSrc: Oral     SpO2: 100% 99% 95% 94%    Updated Vital Signs BP (!) 91/49   Pulse 74   Temp 99 F (37.2 C) (Oral)   Resp (!) 22   SpO2 94%   Physical Exam Vitals and nursing note reviewed.  Constitutional:      General: She is not in acute distress.    Appearance: She is well-developed. She is not ill-appearing.  HENT:     Head: Normocephalic and atraumatic. No abrasion, contusion, right periorbital erythema, left periorbital erythema or laceration.     Jaw: No trismus, tenderness, swelling or pain on movement.     Comments: No pain with palpation over the temporal artery and no bruit heard on auscultation.  No carotid bruits heard bilaterally.  Patient visibly uncomfortable with bright light with eye exam.    Mouth/Throat:     Mouth: Mucous membranes are moist.  Eyes:     General: No visual field deficit or scleral icterus.    Extraocular Movements: Extraocular movements intact.     Right eye: No nystagmus.     Left eye: No nystagmus.     Pupils: Pupils are  equal, round, and reactive to light. Pupils are equal.  Cardiovascular:     Rate and Rhythm: Normal rate and regular rhythm.     Heart sounds: Normal heart sounds.  Pulmonary:     Effort: Pulmonary effort is normal. No respiratory distress.     Breath sounds: Normal breath sounds. No stridor. No wheezing, rhonchi or rales.  Abdominal:     General: Bowel sounds are normal. There is no distension.     Palpations: Abdomen is soft.     Tenderness: There is no abdominal tenderness. There is no guarding.  Musculoskeletal:        General: Normal range of motion.     Cervical back: Normal range of motion and neck supple. No rigidity.  Lymphadenopathy:     Cervical: No cervical adenopathy.  Skin:    General: Skin is warm and dry.     Capillary Refill: Capillary refill takes less than 2 seconds.  Neurological:     Mental Status: She is alert and oriented to person, place, and time.     GCS: GCS eye subscore is 4. GCS verbal subscore is 5. GCS motor subscore is 6.     Cranial Nerves: No facial asymmetry.     Motor: No weakness.     Coordination: Coordination normal.  Psychiatric:        Mood and Affect: Mood normal.        Speech: Speech normal.        Behavior: Behavior normal.     (all labs ordered are listed, but only abnormal results are displayed) Labs Reviewed  CBC WITH DIFFERENTIAL/PLATELET - Abnormal; Notable for the following components:      Result Value   Abs Immature Granulocytes 0.09 (*)    All other components within normal limits  URINALYSIS, ROUTINE W REFLEX MICROSCOPIC - Abnormal; Notable for the following components:   Color, Urine STRAW (*)    pH 9.0 (*)    Ketones, ur 5 (*)    All other components within normal limits  COMPREHENSIVE METABOLIC PANEL WITH GFR  HCG, SERUM, QUALITATIVE    EKG: None  Radiology: CT Head Wo Contrast Result Date: 06/29/2024 EXAM: CT HEAD WITHOUT CONTRAST 06/29/2024 07:28:47 PM TECHNIQUE: CT of the head was performed without the  administration of intravenous contrast. Automated exposure control, iterative reconstruction, and/or weight based adjustment of the mA/kV was utilized to reduce the radiation dose to as low as reasonably achievable. COMPARISON: 01/21/2023 CLINICAL HISTORY: Headache, increasing frequency or severity. FINDINGS: BRAIN AND VENTRICLES: No acute hemorrhage. No evidence of acute infarct. No hydrocephalus. No extra-axial collection. No mass effect or midline shift. ORBITS: No acute abnormality. SINUSES: No acute abnormality. SOFT TISSUES AND SKULL: No acute soft tissue abnormality. No skull fracture. IMPRESSION:  1. No acute intracranial abnormality. Electronically signed by: Franky Crease MD 06/29/2024 07:34 PM EST RP Workstation: [id number]    Procedures   Medications Ordered in the ED  ondansetron  (ZOFRAN ) injection 4 mg (4 mg Intravenous Given 06/29/24 1816)  acetaminophen  (TYLENOL ) tablet 1,000 mg (1,000 mg Oral Given 06/29/24 1833)  metoCLOPramide  (REGLAN ) injection 10 mg (10 mg Intravenous Given 06/29/24 1907)     Patient presents to the ED for concern of headache with associated vomiting and unilateral eye pain, this involves an extensive number of treatment options, and is a complaint that carries with it a high risk of complications and morbidity.  The differential diagnosis includes migraine, temporal arteritis, IIH, intracranial hemorrhage, intracranial mass, or optic neuritis.  Low suspicion for temporal arteritis considering patient's age and physical exam findings. Low suspicion for optic neuritis based on patient's symptoms and physical exam findings. No abnormalities seen on CT imaging of the head.  Additional history obtained:  Additional history obtained from  Outside Medical Records   External records from outside source obtained and reviewed including prescribed medication list and primary care visit notes.   Lab Tests:  I Ordered, and personally interpreted labs.  The pertinent  results include: Unremarkable labs.     Imaging Studies ordered:  I ordered imaging studies including CT head without contrast I independently visualized and interpreted imaging which showed no acute abnormality I agree with the radiologist interpretation   Medicines ordered and prescription drug management:  I ordered medication including Tylenol , Reglan , Zofran  for migraine and nausea. Reevaluation of the patient after these medicines showed that the patient improved.  Migraine improved with medications and nausea resolved with medications. I have reviewed the patients home medicines and have made adjustments as needed   Problem List / ED Course:  Patient is here for evaluation of headache with associated right eye pain and nausea/vomiting.  Labs and imaging are unremarkable.  Based on patient's history and improvement with medications this is likely a migraine. Patient noted to be sleeping and nontachycardic when low blood pressure resulted; otherwise patient has been normotensive.  She denies dizziness.   Reevaluation:  After the interventions noted above, I reevaluated the patient and found that they have :improved   Dispostion:  After consideration of the diagnostic results and the patients response to treatment, I feel that the patent would benefit from supportive care in the home setting.  Patient encouraged to pick up previously prescribed migraine medication, sumatriptan , by her primary care physician in June of this year and to continue to follow with primary care for ongoing management of migraines.  I have sent Zofran  for nausea.  She will return if symptoms persist or worsen or any other concerning symptoms follow-up.   Medical Decision Making Amount and/or Complexity of Data Reviewed Labs: ordered. Radiology: ordered.  Risk OTC drugs. Prescription drug management.    Final diagnoses:  Migraine without status migrainosus, not intractable, unspecified  migraine type    ED Discharge Orders          Ordered    ondansetron  (ZOFRAN -ODT) 4 MG disintegrating tablet  Every 8 hours PRN        06/29/24 2028               Rosina Almarie DELENA DEVONNA 06/29/24 2047    Randol Simmonds, MD 06/30/24 1302

## 2024-06-29 NOTE — Discharge Instructions (Signed)
 As we discussed there were no acute findings found on your imaging or lab work today.  I would recommend picking up the migraine medication, sumatriptan , that you were prescribed in June of this year and to continue with close follow-up with your primary care physician for ongoing management of your migraines.  I have sent a prescription for Zofran  to be used as needed for nausea.  Please return if symptoms persist or worsen or any other concerning symptoms develop.

## 2024-09-04 ENCOUNTER — Ambulatory Visit: Payer: Self-pay | Admitting: Student

## 2024-09-04 ENCOUNTER — Ambulatory Visit: Payer: Self-pay

## 2024-09-04 NOTE — Progress Notes (Unsigned)
" ° °  Established Patient Office Visit  Subjective   Patient ID: Phyllis Kidd, female    DOB: 12-28-1995  Age: 29 y.o. MRN: [id number]  No chief complaint on file.   Phyllis Kidd is a 29 y.o. who presents to the clinic for ***. Please see problem based assessment and plan for additional details.   Abdominal pain When Where Worse since?  Period?  Constipation Dysuria/ frequency  Changes to vag discharge/ new sexual partners Sexually active? Blood in stool  Melena Weight loss F/C HB Makes it worse Makes it better Meds tried?  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 BP Readings from Last 3 Encounters:  06/29/24 (!) 91/49  04/17/24 (!) 116/56  02/06/24 132/74   Wt Readings from Last 3 Encounters:  04/17/24 214 lb 3.2 oz (97.2 kg)  02/06/24 216 lb 0.8 oz (98 kg)  01/19/24 217 lb 3.2 oz (98.5 kg)      Physical Exam   No results found for any visits on 09/04/24.  Last metabolic panel Lab Results  Component Value Date   GLUCOSE 94 06/29/2024   NA 141 06/29/2024   K 3.9 06/29/2024   CL 108 06/29/2024   CO2 25 06/29/2024   BUN 9 06/29/2024   CREATININE 0.99 06/29/2024   GFRNONAA >60 06/29/2024   CALCIUM 9.6 06/29/2024   PROT 7.0 06/29/2024   ALBUMIN 4.3 06/29/2024   BILITOT 0.4  06/29/2024   ALKPHOS 49 06/29/2024   AST 19 06/29/2024   ALT 12 06/29/2024   ANIONGAP 9 06/29/2024      The ASCVD Risk score (Arnett DK, et al., 2019) failed to calculate for the following reasons:   The 2019 ASCVD risk score is only valid for ages 90 to 50   * - Cholesterol units were assumed    Assessment & Plan:   Problem List Items Addressed This Visit   None   No follow-ups on file.    Damien Lease, DO  "

## 2024-09-04 NOTE — Telephone Encounter (Signed)
 FYI Only or Action Required?: FYI only for provider: appointment scheduled on 09/04/24.  Patient was last seen in primary care on 04/17/2024 by Charmayne Holmes, DO.  Called Nurse Triage reporting Abdominal Pain.  Symptoms began several months ago.  Interventions attempted: Ice/heat application.  Symptoms are: unchanged.  Triage Disposition: See HCP Within 4 Hours (Or PCP Triage)  Patient/caregiver understands and will follow disposition?: Yes  Reason for Disposition  [1] MILD-MODERATE pain AND [2] constant AND [3] present > 2 hours  Answer Assessment - Initial Assessment Questions Pt wants an appt - last OV 04/17/24. Pt reports lower abdominal pain, heart burn, denies swelling in abd, swelling in feet, 8/10 pain in lower abdomen.  1. LOCATION: Where does it hurt?      Lower abdomen 2. RADIATION: Does the pain shoot anywhere else? (e.g., chest, back)     Denies  3. ONSET: When did the pain begin? (e.g., minutes, hours or days ago)      Since September  4. SUDDEN: Gradual or sudden onset?     Gradual  5. PATTERN Does the pain come and go, or is it constant?     Constant  6. SEVERITY: How bad is the pain?  (e.g., Scale 1-10; mild, moderate, or severe)     8/10 7. RECURRENT SYMPTOM: Have you ever had this type of stomach pain before? If Yes, ask: When was the last time? and What happened that time?      Yes - last seen for same issue on 04/17/24 8. CAUSE: What do you think is causing the stomach pain? (e.g., gallstones, recent abdominal surgery)     N/a  9. RELIEVING/AGGRAVATING FACTORS: What makes it better or worse? (e.g., antacids, bending or twisting motion, bowel movement)     Hot rags  10. OTHER SYMPTOMS: Do you have any other symptoms? (e.g., back pain, diarrhea, fever, urination pain, vomiting)       Back pain  11. PREGNANCY: Is there any chance you are pregnant? When was your last menstrual period?       Pt unsure  Protocols used: Abdominal  Pain - Female-A-AH  Reason for Triage: PT has Pain in bottom of stomach/since 04/17/24 Getting worse Severe pain leverl 8/10

## 2024-09-04 NOTE — Telephone Encounter (Signed)
 Pt has an appt today 09/04/24 with Dr Kandis.
